# Patient Record
Sex: Male | Born: 1955 | Race: White | Hispanic: No | Marital: Married | Smoking: Never smoker
Health system: Southern US, Community
[De-identification: ages and names within clinical notes are randomized; demographics above are authoritative.]

## PROBLEM LIST (undated history)

## (undated) DIAGNOSIS — C801 Malignant (primary) neoplasm, unspecified: Secondary | ICD-10-CM

## (undated) DIAGNOSIS — I4891 Unspecified atrial fibrillation: Secondary | ICD-10-CM

## (undated) DIAGNOSIS — Z86718 Personal history of other venous thrombosis and embolism: Secondary | ICD-10-CM

## (undated) DIAGNOSIS — M1990 Unspecified osteoarthritis, unspecified site: Secondary | ICD-10-CM

## (undated) HISTORY — PX: BACK SURGERY: SHX140

---

## 1998-07-01 HISTORY — PX: BACK SURGERY: SHX140

## 2019-08-02 DIAGNOSIS — I499 Cardiac arrhythmia, unspecified: Secondary | ICD-10-CM

## 2019-08-02 HISTORY — DX: Cardiac arrhythmia, unspecified: I49.9

## 2019-10-08 ENCOUNTER — Emergency Department (HOSPITAL_BASED_OUTPATIENT_CLINIC_OR_DEPARTMENT_OTHER): Payer: Medicare Other

## 2019-10-08 ENCOUNTER — Other Ambulatory Visit: Payer: Self-pay

## 2019-10-08 ENCOUNTER — Encounter (HOSPITAL_BASED_OUTPATIENT_CLINIC_OR_DEPARTMENT_OTHER): Payer: Self-pay | Admitting: *Deleted

## 2019-10-08 ENCOUNTER — Emergency Department (HOSPITAL_BASED_OUTPATIENT_CLINIC_OR_DEPARTMENT_OTHER)
Admission: EM | Admit: 2019-10-08 | Discharge: 2019-10-08 | Disposition: A | Discharge status: Home / Self Care | Payer: Medicare Other | Attending: Emergency Medicine | Admitting: Emergency Medicine

## 2019-10-08 DIAGNOSIS — M79605 Pain in left leg: Secondary | ICD-10-CM

## 2019-10-08 DIAGNOSIS — M79652 Pain in left thigh: Secondary | ICD-10-CM | POA: Insufficient documentation

## 2019-10-08 DIAGNOSIS — Z8546 Personal history of malignant neoplasm of prostate: Secondary | ICD-10-CM | POA: Insufficient documentation

## 2019-10-08 MED ORDER — CELECOXIB 200 MG PO CAPS: 200.0000 mg | ORAL_CAPSULE | Freq: Two times a day (BID) | ORAL | 1 refills | Status: DC | PRN

## 2019-10-08 NOTE — Discharge Instructions (Addendum)
Please call the office of Dr. Raeford Razor here at [facility] to schedule appointment for physical therapy.  I have prescribed you to 200 mg Celebrex.  Please take, as prescribed.  Discontinue should you notice any dark black stools or should you develop epigastric pain.  Return to the ED or seek immediate medical attention should you experience any new or worsening symptoms.

## 2019-10-08 NOTE — ED Triage Notes (Signed)
Leg numbness and pain x years  Past few weeks left leg is worse unable to stand on it but for a few minutes  Hx of back surgery

## 2019-10-08 NOTE — ED Provider Notes (Signed)
[place] EMERGENCY DEPARTMENT Provider Note   CSN: [id number] Arrival date & time: 10/08/19  [id number]     History Chief Complaint  Patient presents with  . Leg Pain    Raymond Walsh is a 64 y.o. male with past medical history significant for osteoarthritis, cervical fusion, melanoma, and prostate cancer diagnosed in 2016 presents to the ED with a 3-week history of proximal left thigh discomfort with standing.  Patient is very active and exercises on a daily basis.  He has his own personal gym and is able to squat 225 lb without any significant limitation or discomfort.  However, he has noticed that with prolonged standing he will develop some proximal anterior thigh discomfort described as a sharp, burning sensation.  He has been taking Advil, with some relief.  He sees Dr. Grandville Silos, hand surgery, for his osteoarthritis.  Patient is being followed by Dr. Estill Dooms at [facility] and patient's PSA has been downward trending and he is considered to be low risk with ongoing surveillance every six months.   Patient reports that his wife sent him to the ER for evaluation because she was concerned about possible arterial or venous thrombosis.  He denies any recent illness, fevers or chills, weakness, true numbness, pain with defecation, incontinence, urinary symptoms, precipitating trauma, history of clots, clotting disorder, recent surgery or immobilization, blurred vision, gait disturbance, or other focal neurologic deficits.    HPI     History reviewed. No pertinent past medical history.  There are no problems to display for this patient.    No family history on file.  Social History   Tobacco Use  . Smoking status: Not on file  Substance Use Topics  . Alcohol use: Not on file  . Drug use: Not on file    Home Medications Prior to Admission medications   Medication Sig Start Date End Date Taking? Authorizing Provider  celecoxib (CELEBREX) 200 MG capsule Take 1 capsule (200  mg total) by mouth 2 (two) times daily between meals as needed for moderate pain. 10/08/19   Corena Herter, PA-C    Allergies    Patient has no known allergies.  Review of Systems   Review of Systems  Musculoskeletal: Positive for myalgias.  Skin: Negative for color change and wound.  Neurological: Negative for weakness and numbness.    Physical Exam Updated Vital Signs BP (!) 142/85 (BP Location: Right Arm)   Pulse 64   Temp 98.5 F (36.9 C) (Oral)   Resp 18   Ht 5\' 11"  (1.803 m)   Wt 108.9 kg   SpO2 99%   BMI 33.48 kg/m   Physical Exam Vitals and nursing note reviewed. Exam conducted with a chaperone present.  Constitutional:      General: He is not in acute distress.    Appearance: Normal appearance. He is not ill-appearing.  HENT:     Head: Normocephalic and atraumatic.  Eyes:     General: No scleral icterus.    Conjunctiva/sclera: Conjunctivae normal.  Cardiovascular:     Rate and Rhythm: Normal rate and regular rhythm.     Pulses: Normal pulses.     Heart sounds: Normal heart sounds.  Pulmonary:     Effort: Pulmonary effort is normal. No respiratory distress.     Breath sounds: Normal breath sounds.  Musculoskeletal:     Cervical back: Normal range of motion and neck supple. No rigidity.     Comments: No midline cervical, thoracic, lumbar, or sacral TTP.  Left leg: No swelling, rash, erythema, dusky appearance, or other overlying skin changes.  No warmth.  No tenderness to palpation.  Negative SLR bilaterally.  Pedal pulse and cap refill intact.  Sensation intact throughout.  Able to flex and extend at hip, knee, and ankle against resistance.  Skin:    General: Skin is dry.     Capillary Refill: Capillary refill takes less than 2 seconds.  Neurological:     General: No focal deficit present.     Mental Status: He is alert and oriented to person, place, and time.     GCS: GCS eye subscore is 4. GCS verbal subscore is 5. GCS motor subscore is 6.     Cranial  Nerves: No cranial nerve deficit.     Sensory: No sensory deficit.     Motor: No weakness.     Gait: Gait normal.     Comments: Sensation intact throughout.  No gait abnormality.  Psychiatric:        Mood and Affect: Mood normal.        Behavior: Behavior normal.        Thought Content: Thought content normal.     ED Results / Procedures / Treatments   Labs (all labs ordered are listed, but only abnormal results are displayed) Labs Reviewed - No data to display  EKG None  Radiology DG Lumbar Spine Complete  Result Date: 10/08/2019 CLINICAL DATA:  Left anterior thigh pain. History of prostate cancer. EXAM: LUMBAR SPINE - COMPLETE 4+ VIEW COMPARISON:  No prior. FINDINGS: Lumbar spine scoliosis concave left. Diffuse degenerative change. No acute bony abnormality identified. No evidence of fracture. Vascular calcifications noted. IMPRESSION: Lumbar spine scoliosis concave left. Diffuse degenerative change. No acute bony abnormality identified. Electronically Signed   By: Marcello Moores  Register   On: 10/08/2019 09:57   DG Pelvis 1-2 Views  Result Date: 10/08/2019 CLINICAL DATA:  Groin pain.  History of prostate cancer. EXAM: PELVIS - 1-2 VIEW COMPARISON:  No prior. FINDINGS: Degenerative changes lumbar spine and both hips. Degenerative changes both SI joints. No acute bony abnormality identified. No evidence of fracture dislocation. Pelvic calcifications consistent phleboliths. IMPRESSION: Degenerative changes lumbar spine and both hips. No acute bony abnormality identified. Electronically Signed   By: Marcello Moores  Register   On: 10/08/2019 09:56    Procedures Procedures (including critical care time)  Medications Ordered in ED Medications - No data to display  ED Course  I have reviewed the triage vital signs and the nursing notes.  Pertinent labs & imaging results that were available during my care of the patient were reviewed by me and considered in my medical decision making (see chart for  details).    MDM Rules/Calculators/A&P                      Patient's history and physical exam is not concerning for arterial or venous thrombosis.  No worsening leg pain with exertion or strength training.  Patient is able to ambulate and perform squats without difficulty or limitation.  No concern for femoral fracture.  Compartments are soft.  Sensation is intact throughout and symmetric when compared to right side.  There is no swelling or overlying skin changes that would be otherwise concerning for an acute process.  His symptoms have been going on for several weeks and have been alleviated with Advil.  Suspect musculoskeletal etiology, low suspicion for vascular.  I personally reviewed plain films obtained of lumbar spine and hip which  were ordered to evaluate for possible malignancy or other acute osseous abnormalities.  Patient demonstrate diffuse degenerative changes, but nothing acute that might explain his 3-week history of discomfort.   Patient is seeing his primary care physician, Dr. Claiborne Billings, for 10/11/2019.  We will refer patient to physical therapy for ongoing evaluation management.  Will have patient call the [facility] to schedule an appointment for physical therapy.  Dr. Gilford Raid personally evaluated patient and agrees with assessment and plan.   Strict ED return precautions discussed.  All of the evaluation and work-up results were discussed with the patient and any family at bedside. They were provided opportunity to ask any additional questions and have none at this time. They have expressed understanding of verbal discharge instructions as well as return precautions and are agreeable to the plan.    Final Clinical Impression(s) / ED Diagnoses Final diagnoses:  Left leg pain    Rx / DC Orders ED Discharge Orders         Ordered    celecoxib (CELEBREX) 200 MG capsule  2 times daily between meals PRN     10/08/19 0959           Corena Herter,  PA-C 10/08/19 1018    Isla Pence, MD 10/08/19 1425

## 2019-10-20 ENCOUNTER — Ambulatory Visit: Payer: Self-pay

## 2019-10-20 ENCOUNTER — Other Ambulatory Visit: Payer: Self-pay

## 2019-10-20 ENCOUNTER — Encounter: Payer: Self-pay | Admitting: Family Medicine

## 2019-10-20 ENCOUNTER — Ambulatory Visit (INDEPENDENT_AMBULATORY_CARE_PROVIDER_SITE_OTHER): Payer: Medicare Other | Admitting: Family Medicine

## 2019-10-20 VITALS — BP 119/81 | HR 58 | Ht 72.0 in | Wt 230.0 lb

## 2019-10-20 DIAGNOSIS — M25552 Pain in left hip: Secondary | ICD-10-CM

## 2019-10-20 MED ORDER — METHYLPREDNISOLONE ACETATE 40 MG/ML IJ SUSP
40.0000 mg | Freq: Once | INTRAMUSCULAR | Status: AC
  Administered 2019-10-20: 40 mg via INTRA_ARTICULAR

## 2019-10-20 NOTE — Patient Instructions (Addendum)
Nice to meet you Please try the exercises. (Side lunges)  Please try ice or heat   You can try rubbing voltaren over the counter on the area.  Please send me a message in MyChart with any questions or updates.  Please see me back in 4 weeks.   --Dr. Raeford Razor

## 2019-10-20 NOTE — Progress Notes (Signed)
Raymond Walsh - 64 y.o. male MRN [id number]  Date of birth: 1956-04-14  SUBJECTIVE:  Including CC & ROS.  Chief Complaint  Patient presents with  . Leg Pain    left leg x 4-5 weeks    Raymond Walsh is a 64 y.o. male that is presenting with left lateral hip pain.  The pain is been ongoing for roughly a month.  Pain is worse with walking and has to sit on a regular basis.  Denies any history of trauma or injury.  Has not started anything new or different.  No improvement with diclofenac..  Independent review of the lumbar spine x-ray from 4/9 shows loss of the lordosis and multilevel degenerative changes of the disc at L4-L5, L5-S1. Independent review of the pelvis x-ray from 4/9 shows no significant joint disease of the hips.   Review of Systems See HPI   HISTORY: Past Medical, Surgical, Social, and Family History Reviewed & Updated per EMR.   Pertinent Historical Findings include:  No past medical history on file.  Past Surgical History:  Procedure Laterality Date  . BACK SURGERY      No family history on file.  Social History   Socioeconomic History  . Marital status: Married    Spouse name: Not on file  . Number of children: Not on file  . Years of education: Not on file  . Highest education level: Not on file  Occupational History  . Not on file  Tobacco Use  . Smoking status: Never Smoker  . Smokeless tobacco: Never Used  Substance and Sexual Activity  . Alcohol use: Not on file  . Drug use: Not on file  . Sexual activity: Not on file  Other Topics Concern  . Not on file  Social History Narrative  . Not on file   Social Determinants of Health   Financial Resource Strain:   . Difficulty of Paying Living Expenses:   Food Insecurity:   . Worried About Charity fundraiser in the Last Year:   . Arboriculturist in the Last Year:   Transportation Needs:   . Film/video editor (Medical):   Marland Kitchen Lack of Transportation (Non-Medical):   Physical Activity:     . Days of Exercise per Week:   . Minutes of Exercise per Session:   Stress:   . Feeling of Stress :   Social Connections:   . Frequency of Communication with Friends and Family:   . Frequency of Social Gatherings with Friends and Family:   . Attends Religious Services:   . Active Member of Clubs or Organizations:   . Attends Archivist Meetings:   Marland Kitchen Marital Status:   Intimate Partner Violence:   . Fear of Current or Ex-Partner:   . Emotionally Abused:   Marland Kitchen Physically Abused:   . Sexually Abused:      PHYSICAL EXAM:  VS: BP 119/81   Pulse (!) 58   Ht 6' (1.829 m)   Wt 230 lb (104.3 kg)   BMI 31.19 kg/m  Physical Exam Gen: NAD, alert, cooperative with exam, well-appearing MSK:  Left hip: Normal internal and external rotation of the hips. Tenderness palpation over the greater trochanter. Normal strength resistance with hip flexion. Normal flexion and extension of the back. Good stability 1 leg standing. Instability with 1 leg standing and abduction. Negative straight leg raise. Neurovascular intact   Aspiration/Injection Procedure Note Herb Euresti 20-Nov-1955  Procedure: Injection Indications: Left hip pain  Procedure Details  Consent: Risks of procedure as well as the alternatives and risks of each were explained to the (patient/caregiver).  Consent for procedure obtained. Time Out: Verified patient identification, verified procedure, site/side was marked, verified correct patient position, special equipment/implants available, medications/allergies/relevent history reviewed, required imaging and test results available.  Performed.  The area was cleaned with iodine and alcohol swabs.    The left greater trochanteric bursa was injected using 1 cc's of 40 mg of Depo-Medrol and 4 cc's of 0.25% bupivacaine with a 22 3 1/2" needle.  Ultrasound was used. Images were obtained in short views showing the injection.     A sterile dressing was applied.  Patient  did tolerate procedure well.     ASSESSMENT & PLAN:   Greater trochanteric pain syndrome of left lower extremity Pain likely representative trochanteric pain syndrome.  He has weakness on hip abduction.  Unlikely to be joint involvement. -Injection today. -Counseled on home exercise therapy and supportive care. -Could consider physical therapy

## 2019-10-20 NOTE — Assessment & Plan Note (Signed)
Pain likely representative trochanteric pain syndrome.  He has weakness on hip abduction.  Unlikely to be joint involvement. -Injection today. -Counseled on home exercise therapy and supportive care. -Could consider physical therapy

## 2019-11-17 ENCOUNTER — Encounter: Payer: Self-pay | Admitting: Family Medicine

## 2019-11-17 ENCOUNTER — Ambulatory Visit (INDEPENDENT_AMBULATORY_CARE_PROVIDER_SITE_OTHER): Payer: Medicare Other | Admitting: Family Medicine

## 2019-11-17 ENCOUNTER — Other Ambulatory Visit: Payer: Self-pay

## 2019-11-17 DIAGNOSIS — M25552 Pain in left hip: Secondary | ICD-10-CM | POA: Diagnosis present

## 2019-11-17 NOTE — Assessment & Plan Note (Signed)
Doing well since his injection.  Pain is intermittent at this point. -Counseled on home exercise therapy and supportive care. -Could try physical therapy. -Could repeat injection if needed.

## 2019-11-17 NOTE — Progress Notes (Signed)
  Raymond Walsh - 64 y.o. male MRN [id number]  Date of birth: 1955-10-10  SUBJECTIVE:  Including CC & ROS.  Chief Complaint  Patient presents with  . Follow-up    left leg    Raymond Walsh is a 64 y.o. male that is following up for his left leg pain.  The injection seemed to help with most of the pain.  It will occur from time to time.  He does exercises when that happens and his pain reduces.  He does not have to stop and sit down while walking.   Review of Systems See HPI   HISTORY: Past Medical, Surgical, Social, and Family History Reviewed & Updated per EMR.   Pertinent Historical Findings include:  No past medical history on file.  Past Surgical History:  Procedure Laterality Date  . BACK SURGERY      No family history on file.  Social History   Socioeconomic History  . Marital status: Married    Spouse name: Not on file  . Number of children: Not on file  . Years of education: Not on file  . Highest education level: Not on file  Occupational History  . Not on file  Tobacco Use  . Smoking status: Never Smoker  . Smokeless tobacco: Never Used  Substance and Sexual Activity  . Alcohol use: Not on file  . Drug use: Not on file  . Sexual activity: Not on file  Other Topics Concern  . Not on file  Social History Narrative  . Not on file   Social Determinants of Health   Financial Resource Strain:   . Difficulty of Paying Living Expenses:   Food Insecurity:   . Worried About Charity fundraiser in the Last Year:   . Arboriculturist in the Last Year:   Transportation Needs:   . Film/video editor (Medical):   Marland Kitchen Lack of Transportation (Non-Medical):   Physical Activity:   . Days of Exercise per Week:   . Minutes of Exercise per Session:   Stress:   . Feeling of Stress :   Social Connections:   . Frequency of Communication with Friends and Family:   . Frequency of Social Gatherings with Friends and Family:   . Attends Religious Services:   . Active  Member of Clubs or Organizations:   . Attends Archivist Meetings:   Marland Kitchen Marital Status:   Intimate Partner Violence:   . Fear of Current or Ex-Partner:   . Emotionally Abused:   Marland Kitchen Physically Abused:   . Sexually Abused:      PHYSICAL EXAM:  VS: BP (!) 146/86   Pulse 76   Ht 6' (1.829 m)   Wt 225 lb (102.1 kg)   BMI 30.52 kg/m  Physical Exam Gen: NAD, alert, cooperative with exam, well-appearing MSK:  Left hip: No significant tenderness to palpation of the greater trochanter. Normal strength resistance with hip flexion. Normal internal and external rotation. Neurovascularly intact     ASSESSMENT & PLAN:   Greater trochanteric pain syndrome of left lower extremity Doing well since his injection.  Pain is intermittent at this point. -Counseled on home exercise therapy and supportive care. -Could try physical therapy. -Could repeat injection if needed.

## 2019-12-29 ENCOUNTER — Ambulatory Visit: Payer: Medicare Other | Admitting: Family Medicine

## 2020-06-15 ENCOUNTER — Encounter (HOSPITAL_BASED_OUTPATIENT_CLINIC_OR_DEPARTMENT_OTHER): Payer: Self-pay | Admitting: Emergency Medicine

## 2020-06-15 ENCOUNTER — Emergency Department (HOSPITAL_BASED_OUTPATIENT_CLINIC_OR_DEPARTMENT_OTHER)
Admission: EM | Admit: 2020-06-15 | Discharge: 2020-06-15 | Disposition: A | Discharge status: Home / Self Care | Payer: Medicare Other | Attending: Emergency Medicine | Admitting: Emergency Medicine

## 2020-06-15 ENCOUNTER — Other Ambulatory Visit: Payer: Self-pay

## 2020-06-15 ENCOUNTER — Emergency Department (HOSPITAL_BASED_OUTPATIENT_CLINIC_OR_DEPARTMENT_OTHER): Payer: Medicare Other

## 2020-06-15 DIAGNOSIS — I4891 Unspecified atrial fibrillation: Secondary | ICD-10-CM | POA: Insufficient documentation

## 2020-06-15 DIAGNOSIS — F17210 Nicotine dependence, cigarettes, uncomplicated: Secondary | ICD-10-CM | POA: Insufficient documentation

## 2020-06-15 DIAGNOSIS — R55 Syncope and collapse: Secondary | ICD-10-CM | POA: Insufficient documentation

## 2020-06-15 DIAGNOSIS — Z8546 Personal history of malignant neoplasm of prostate: Secondary | ICD-10-CM | POA: Insufficient documentation

## 2020-06-15 DIAGNOSIS — I480 Paroxysmal atrial fibrillation: Secondary | ICD-10-CM

## 2020-06-15 HISTORY — DX: Unspecified atrial fibrillation: I48.91

## 2020-06-15 HISTORY — DX: Malignant (primary) neoplasm, unspecified: C80.1

## 2020-06-15 LAB — CBC
HCT: 43.7 % (ref 39.0–52.0)
Hemoglobin: 15.2 g/dL (ref 13.0–17.0)
MCH: 31.3 pg (ref 26.0–34.0)
MCHC: 34.8 g/dL (ref 30.0–36.0)
MCV: 89.9 fL (ref 80.0–100.0)
Platelets: 339 10*3/uL (ref 150–400)
RBC: 4.86 MIL/uL (ref 4.22–5.81)
RDW: 12.1 % (ref 11.5–15.5)
WBC: 8.8 10*3/uL (ref 4.0–10.5)
nRBC: 0 % (ref 0.0–0.2)

## 2020-06-15 LAB — BASIC METABOLIC PANEL
Anion gap: 9 (ref 5–15)
BUN: 22 mg/dL (ref 8–23)
CO2: 23 mmol/L (ref 22–32)
Calcium: 9.1 mg/dL (ref 8.9–10.3)
Chloride: 103 mmol/L (ref 98–111)
Creatinine, Ser: 1.36 mg/dL — ABNORMAL HIGH (ref 0.61–1.24)
GFR, Estimated: 57 mL/min — ABNORMAL LOW (ref >60)
Glucose, Bld: 119 mg/dL — ABNORMAL HIGH (ref 70–99)
Potassium: 3.9 mmol/L (ref 3.5–5.1)
Sodium: 135 mmol/L (ref 135–145)

## 2020-06-15 LAB — CBG MONITORING, ED: Glucose-Capillary: 116 mg/dL — ABNORMAL HIGH (ref 70–99)

## 2020-06-15 NOTE — ED Triage Notes (Signed)
Syncope approx 45 minutes, family reports big coughing fit, turned red, and hit the floor by fireplace. Pt reports feeling tired now, denies pain. Seen today at pcp, was found to be in afib and restarted on eliquis.

## 2020-06-15 NOTE — ED Provider Notes (Signed)
[place] EMERGENCY DEPARTMENT Provider Note   CSN: [id number] Arrival date & time: 06/15/20  2005     History Chief Complaint  Patient presents with   Loss of Consciousness    Raymond Walsh is a 64 y.o. male.  Presents here with concern for syncope.  Episode happened this evening.  Says anything he got something caught in his throat while he was eating and he had a coughing fit.  Wife states that his face turned red and then he passed out.  Episode was brief, complete return of consciousness, denied head trauma, hit his bottom.  Ambulatory without difficulty since.  No symptoms at present.  Denies any associated chest pain or shortness of breath.  History of paroxysmal A. fib, was recently noted to be in A. fib and primary doctor's office and patient reports his primary doctor is going to set him up with a cardiologist to discuss further management, consideration of cardioversion.  Patient has no palpitations, chest discomfort or lightheadedness associated with A. fib episodes.  Unaware that he was in A. fib until someone told him.  HPI     Past Medical History:  Diagnosis Date   A-fib ([place])    Cancer [facility])    prostate    Patient Active Problem List   Diagnosis Date Noted   Greater trochanteric pain syndrome of left lower extremity 10/20/2019    Past Surgical History:  Procedure Laterality Date   BACK SURGERY         No family history on file.  Social History   Tobacco Use   Smoking status: Current Some Day Smoker   Smokeless tobacco: Never Used  Substance Use Topics   Drug use: Yes    Types: Marijuana    Home Medications Prior to Admission medications   Medication Sig Start Date End Date Taking? Authorizing Provider  celecoxib (CELEBREX) 200 MG capsule Take 1 capsule (200 mg total) by mouth 2 (two) times daily between meals as needed for moderate pain. 10/08/19   Corena Herter, PA-C    Allergies    Patient has no known  allergies.  Review of Systems   Review of Systems  Constitutional: Negative for chills and fever.  HENT: Negative for ear pain and sore throat.   Eyes: Negative for pain and visual disturbance.  Respiratory: Positive for cough. Negative for shortness of breath.   Cardiovascular: Negative for chest pain and palpitations.  Gastrointestinal: Negative for abdominal pain and vomiting.  Genitourinary: Negative for dysuria and hematuria.  Musculoskeletal: Negative for arthralgias and back pain.  Skin: Negative for color change and rash.  Neurological: Positive for syncope. Negative for seizures.  All other systems reviewed and are negative.   Physical Exam Updated Vital Signs BP 111/85 (BP Location: Right Arm)    Pulse 85    Temp 98.3 F (36.8 C) (Oral)    Resp (!) 29    Ht 5\' 11"  (1.803 m)    Wt 108.9 kg    SpO2 96%    BMI 33.47 kg/m   Physical Exam Vitals and nursing note reviewed.  Constitutional:      Appearance: He is well-developed and well-nourished.  HENT:     Head: Normocephalic and atraumatic.  Eyes:     Conjunctiva/sclera: Conjunctivae normal.  Cardiovascular:     Rate and Rhythm: Normal rate. Rhythm irregular.     Pulses: Normal pulses.     Heart sounds: No murmur heard.   Pulmonary:     Effort:  Pulmonary effort is normal. No respiratory distress.     Breath sounds: Normal breath sounds.  Abdominal:     Palpations: Abdomen is soft.     Tenderness: There is no abdominal tenderness.  Musculoskeletal:        General: No deformity, signs of injury or edema.     Cervical back: Neck supple.  Skin:    General: Skin is warm and dry.  Neurological:     General: No focal deficit present.     Mental Status: He is alert.  Psychiatric:        Mood and Affect: Mood and affect and mood normal.        Behavior: Behavior normal.     ED Results / Procedures / Treatments   Labs (all labs ordered are listed, but only abnormal results are displayed) Labs Reviewed  BASIC  METABOLIC PANEL - Abnormal; Notable for the following components:      Result Value   Glucose, Bld 119 (*)    Creatinine, Ser 1.36 (*)    GFR, Estimated 57 (*)    All other components within normal limits  CBG MONITORING, ED - Abnormal; Notable for the following components:   Glucose-Capillary 116 (*)    All other components within normal limits  CBC  URINALYSIS, ROUTINE W REFLEX MICROSCOPIC    EKG EKG Interpretation  Date/Time:  Thursday June 15 2020 20:12:10 EST Ventricular Rate:  73 PR Interval:    QRS Duration: 128 QT Interval:  360 QTC Calculation: 396 R Axis:   -47 Text Interpretation: Atrial fibrillation Right bundle branch block Left anterior fascicular block  Bifascicular block  Minimal voltage criteria for LVH, may be normal variant ( R in aVL ) T wave abnormality, consider lateral ischemia Abnormal ECG no prior for comparison no acute STEMI Confirmed by Madalyn Rob [phone]) on 06/15/2020 8:19:58 PM   Radiology No results found.  Procedures Procedures (including critical care time)  Medications Ordered in ED Medications - No data to display  ED Course  I have reviewed the triage vital signs and the nursing notes.  Pertinent labs & imaging results that were available during my care of the patient were reviewed by me and considered in my medical decision making (see chart for details).    MDM Rules/Calculators/A&P                          64 year old male presenting to the emergency room with concern for syncopal episode.  On exam patient noted to be well-appearing with stable vital signs.  No ongoing symptoms.  Basic labs within normal limits.  CXR negative.  Based on review of cardiac monitor and EKG, patient is currently in atrial fibrillation.  Known history of A. fib, though patient normally in sinus.  Currently rate is within normal limits, patient has no symptoms and patient is already anticoagulated.  Given these findings, will defer further  management to outpatient cardiology.  His primary doctor is aware and is working on cardiology referral for further management.  Based on description of symptoms, suspect most likely vasovagal episode.  At present believe patient is appropriate for outpatient management.  Instructed patient needs close follow-up with primary doctor and cardiology.   After the discussed management above, the patient was determined to be safe for discharge.  The patient was in agreement with this plan and all questions regarding their care were answered.  ED return precautions were discussed and the patient will  return to the ED with any significant worsening of condition.   Final Clinical Impression(s) / ED Diagnoses Final diagnoses:  None    Rx / DC Orders ED Discharge Orders    None       Lucrezia Starch, MD 06/17/20 1110

## 2020-06-15 NOTE — ED Notes (Signed)
Resting quietly, awaiting EDP evaluation, remains on cont cardiac monitoring, wife at side

## 2020-06-15 NOTE — ED Notes (Signed)
States rec both Covid Vaccines, 2nd was October 2021

## 2020-06-15 NOTE — ED Notes (Signed)
Pt ambulated to restroom, pt was instructed to stay on stretcher previously, reinforced instructions to remain on stretcher and not to remove cardiac monitoring devices. Also instructed to use urinal to so that urine spec can be obtained

## 2020-06-15 NOTE — Discharge Instructions (Addendum)
Follow-up with your primary doctor and cardiology.  If you have any additional episodes of passing out, any chest pain or difficulty in breathing, return to ER.  Continue your previously prescribed medication.

## 2020-06-15 NOTE — ED Notes (Signed)
To primary MD today for preop for TKA, was told he had A fib. Was at home, eating dinner, had a deep, strong cough, had a syncopal episode, wife states he was coughing and face was very red. Wife states he was awake in a few seconds. Denies any pain at this time.

## 2021-01-31 ENCOUNTER — Other Ambulatory Visit: Payer: Self-pay

## 2021-01-31 ENCOUNTER — Encounter: Payer: Self-pay | Admitting: Physical Therapy

## 2021-01-31 ENCOUNTER — Ambulatory Visit (INDEPENDENT_AMBULATORY_CARE_PROVIDER_SITE_OTHER): Payer: Medicare Other | Admitting: Physical Therapy

## 2021-01-31 DIAGNOSIS — M6281 Muscle weakness (generalized): Secondary | ICD-10-CM | POA: Diagnosis present

## 2021-01-31 DIAGNOSIS — M25561 Pain in right knee: Secondary | ICD-10-CM

## 2021-01-31 DIAGNOSIS — R600 Localized edema: Secondary | ICD-10-CM | POA: Diagnosis not present

## 2021-01-31 DIAGNOSIS — R29898 Other symptoms and signs involving the musculoskeletal system: Secondary | ICD-10-CM

## 2021-01-31 NOTE — Therapy (Signed)
[place], [place], [address] Phone: [phone]   Fax:  [phone]  Physical Therapy Evaluation  Patient Details  Name: Raymond Walsh MRN: [id number] Date of Birth: May 01, 1956 Referring Provider (PT): Dorna Leitz   Encounter Date: 01/31/2021   PT End of Session - 01/31/21 1222     Visit Number 1    Number of Visits 12    Date for PT Re-Evaluation 03/14/21    Progress Note Due on Visit 10    PT Start Time 1140    PT Stop Time 1221    PT Time Calculation (min) 41 min    Activity Tolerance Patient tolerated treatment well;Patient limited by pain    Behavior During Therapy [facility] for tasks assessed/performed             Past Medical History:  Diagnosis Date   A-fib ([place])    Cancer [facility])    prostate    Past Surgical History:  Procedure Laterality Date   BACK SURGERY      There were no vitals filed for this visit.    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 Pt has been performing HHPT exercises daily and using CPM. Pt has been ambulating with SPC since surgery.    Limitations Standing;Walking    How long can you stand comfortably? 5 mins    How long can you walk comfortably? 5 mins    Patient Stated Goals decrease pain, return working out    Currently in Pain? Yes    Pain Score 5     Pain Location Knee    Pain Orientation Right    Pain Descriptors / Indicators Aching;Sore    Pain Type Surgical pain    Pain Onset 1 to 4 weeks ago    Pain Frequency Constant    Aggravating Factors  standing, walking    Pain Relieving Factors ice                [facility] PT Assessment - 01/31/21 0001       Assessment   Medical Diagnosis s/p Rt TKR    Referring Provider (PT) Dorna Leitz    Onset Date/Surgical Date 01/17/21    Next MD Visit 02/07/21      Restrictions   Other Position/Activity Restrictions WBAT      Balance Screen    Has the patient fallen in the past 6 months No      [place] residence    Home Access Stairs to enter    Entrance Stairs-Number of Steps 2    Entrance Stairs-Rails Can reach both    [place] One level      Prior Function   Level of Independence Independent      Observation/Other Assessments   Focus on Therapeutic Outcomes (FOTO)  41 functional status measure      ROM / Strength   AROM / PROM / Strength AROM;Strength      AROM   AROM Assessment Site Knee    Right/Left Knee Right;Left    Right Knee Extension -10    Right Knee Flexion 101    Left Knee Extension 0    Left Knee Flexion 134      Strength   Overall Strength Comments Rt hip grossly 4/5    Strength Assessment Site Knee    Right/Left Knee Right;Left    Right Knee  Flexion 3/5    Right Knee Extension 3-/5    Left Knee Flexion 4+/5    Left Knee Extension 4+/5      Flexibility   Soft Tissue Assessment /Muscle Length yes    Hamstrings decreased bilat Rt > Lt      Palpation   Patella mobility WFL    Palpation comment TTP Rt knee medial and lateral      Ambulation/Gait   Assistive device Straight cane    Gait Pattern Antalgic    Gait Comments decreased gait speed                        Objective measurements completed on examination: See above findings.       [place] Adult PT Treatment/Exercise - 01/31/21 0001       Exercises   Exercises Knee/Hip      Knee/Hip Exercises: Stretches   Gastroc Stretch 2 reps;20 seconds      Knee/Hip Exercises: Seated   Long Arc Quad 10 reps    Heel Slides 10 reps    Ball Squeeze 10 reps      Knee/Hip Exercises: Supine   Quad Sets 10 reps    Heel Slides 10 reps    Hip Adduction Isometric 10 reps    Straight Leg Raises 10 reps      Modalities   Modalities Vasopneumatic      Vasopneumatic   Number Minutes Vasopneumatic  10 minutes    Vasopnuematic Location  Knee    Vasopneumatic Pressure Low     Vasopneumatic Temperature  34                    PT Education - 01/31/21 1203     Education Details PT POC and goals, HEP    Person(s) Educated Patient    Methods Explanation;Demonstration;Handout    Comprehension Returned demonstration;Verbalized understanding                 PT Long Term Goals - 01/31/21 1228       PT LONG TERM GOAL #1   Title Pt will be independent in HEP    Time 6    Period Weeks    Status New    Target Date 03/14/21      PT LONG TERM GOAL #2   Title Pt will improve FOTO to 74 to demo improved functional mobility    Time 6    Period Weeks    Status New    Target Date 03/14/21      PT LONG TERM GOAL #3   Title Pt will improve Rt knee extension AROM to 0 degrees to perform gait without pain    Time 6    Period Weeks    Status New    Target Date 03/14/21      PT LONG TERM GOAL #4   Title Pt will improve Rt LE strength to 4+/5 to perform gait and standing with decreased pain    Time 6    Period Weeks    Status New    Target Date 03/14/21      PT LONG TERM GOAL #5   Title Pt will perform gait without AD in community with pain <= 2/10    Time 6    Period Weeks    Status New    Target Date 03/14/21  Plan - 01/31/21 1223     Clinical Impression Statement Pt is a 65 y/o male who is referred s/p Rt TKR 01/17/21. Pt presents with increased pain, difficulty walking, decreased ROM and strength, increased edema and decreased activity tolerance. Pt will benefit from skilled PT to address deficits and improve functional independence and mobility    Personal Factors and Comorbidities Time since onset of injury/illness/exacerbation    Examination-Activity Limitations Squat;Stairs;Locomotion Level;Stand    Examination-Participation Restrictions Driving;Community Activity;Cleaning;Yard Work    Merchant navy officer Stable/Uncomplicated    Clinical Decision Making Low    Rehab Potential Good    PT  Frequency 2x / week    PT Duration 6 weeks    PT Treatment/Interventions Vasopneumatic Device;Dry needling;Passive range of motion;Taping;Manual techniques;Patient/family education;Therapeutic activities;Therapeutic exercise;Neuromuscular re-education;Balance training;Gait training;Stair training;Moist Heat;Cryotherapy;Aquatic Therapy;Iontophoresis '4mg'$ /ml Dexamethasone;Electrical Stimulation    PT Next Visit Plan assess HEP, progress to standing exercises if able to tolerate, gait and stair training    PT Home Exercise Plan [email or web address]    Consulted and Agree with Plan of Care Patient             Patient will benefit from skilled therapeutic intervention in order to improve the following deficits and impairments:  Pain, Decreased activity tolerance, Decreased strength, Increased edema, Decreased range of motion, Abnormal gait, Difficulty walking, Decreased mobility  Visit Diagnosis: Muscle weakness (generalized) - Plan: PT plan of care cert/re-cert  Acute pain of right knee - Plan: PT plan of care cert/re-cert  Other symptoms and signs involving the musculoskeletal system - Plan: PT plan of care cert/re-cert  Localized edema - Plan: PT plan of care cert/re-cert     Problem List Patient Active Problem List   Diagnosis Date Noted   Greater trochanteric pain syndrome of left lower extremity 10/20/2019  Rechy Bost, PT   Davaughn Hillyard 01/31/2021, 12:33 PM  [place], [place], [address] Phone: [phone]   Fax:  [phone]  Name: Dywan Burki MRN: [id number] Date of Birth: 04/03/1956

## 2021-01-31 NOTE — Patient Instructions (Signed)
Access Code: [email or web address] URL: [email or web address] Date: 01/31/2021 Prepared by: Isabelle Course  Exercises Supine Quad Set - 1 x daily - 7 x weekly - 3 sets - 10 reps Active Straight Leg Raise with Quad Set - 1 x daily - 7 x weekly - 3 sets - 10 reps Supine Heel Slide with Strap - 1 x daily - 7 x weekly - 3 sets - 10 reps Long Sitting Calf Stretch with Strap - 1 x daily - 7 x weekly - 3 sets - 1 reps - 20-30 seconds hold Supine Hip Abduction - 1 x daily - 7 x weekly - 3 sets - 10 reps Seated Long Arc Quad - 1 x daily - 7 x weekly - 3 sets - 10 reps Seated Hip Adduction Isometrics with Ball - 1 x daily - 7 x weekly - 3 sets - 10 reps Seated Knee Flexion Extension AROM - 1 x daily - 7 x weekly - 3 sets - 10 reps

## 2021-02-06 ENCOUNTER — Other Ambulatory Visit: Payer: Self-pay

## 2021-02-06 ENCOUNTER — Ambulatory Visit (INDEPENDENT_AMBULATORY_CARE_PROVIDER_SITE_OTHER): Payer: Medicare Other | Admitting: Physical Therapy

## 2021-02-06 DIAGNOSIS — M6281 Muscle weakness (generalized): Secondary | ICD-10-CM | POA: Diagnosis present

## 2021-02-06 DIAGNOSIS — R29898 Other symptoms and signs involving the musculoskeletal system: Secondary | ICD-10-CM | POA: Diagnosis not present

## 2021-02-06 DIAGNOSIS — M25561 Pain in right knee: Secondary | ICD-10-CM

## 2021-02-06 DIAGNOSIS — R600 Localized edema: Secondary | ICD-10-CM

## 2021-02-06 NOTE — Patient Instructions (Signed)
Access Code: [email or web address] URL: [email or web address] Date: 02/06/2021 Prepared by: Isabelle Course  Exercises Supine Quad Set - 1 x daily - 7 x weekly - 3 sets - 10 reps Active Straight Leg Raise with Quad Set - 1 x daily - 7 x weekly - 3 sets - 10 reps Long Sitting Calf Stretch with Strap - 1 x daily - 7 x weekly - 3 sets - 1 reps - 20-30 seconds hold Seated Long Arc Quad - 1 x daily - 7 x weekly - 3 sets - 10 reps Seated Knee Flexion Extension AROM - 1 x daily - 7 x weekly - 3 sets - 10 reps Standing Terminal Knee Extension with Resistance - 1 x daily - 7 x weekly - 3 sets - 10 reps Standing Single Leg Stance with Counter Support - 1 x daily - 7 x weekly - 3 sets - 1 reps - up to 20 seconds hold Step Up - 1 x daily - 7 x weekly - 3 sets - 10 reps Lateral Step Ups - 1 x daily - 7 x weekly - 3 sets - 10 reps

## 2021-02-06 NOTE — Therapy (Signed)
[place], [place], [address] Phone: [phone]   Fax:  [phone]  Physical Therapy Treatment  Patient Details  Name: Jaysin Lauridsen MRN: [id number] Date of Birth: 1955-12-13 Referring Provider (PT): Dorna Leitz   Encounter Date: 02/06/2021   PT End of Session - 02/06/21 1057     Visit Number 2    Number of Visits 12    Date for PT Re-Evaluation 03/14/21    Authorization - Visit Number 2    Progress Note Due on Visit 10    PT Start Time 1016    PT Stop Time 1103    PT Time Calculation (min) 47 min    Activity Tolerance Patient tolerated treatment well    Behavior During Therapy [facility] for tasks assessed/performed             Past Medical History:  Diagnosis Date   A-fib ([place])    Cancer ([place])    prostate    Past Surgical History:  Procedure Laterality Date   BACK SURGERY      There were no vitals filed for this visit.   Subjective Assessment - 02/06/21 1022     Subjective Pt states "it is some better but it still hurts all the time. It never quits". Pt states he wants his strength back    Patient Stated Goals decrease pain, return working out    Currently in Pain? Yes    Pain Score 3     Pain Location Knee    Pain Orientation Right    Pain Descriptors / Indicators Aching    Pain Type Surgical pain                [facility] PT Assessment - 02/06/21 0001       AROM   Right Knee Extension -8                           [facility] Adult PT Treatment/Exercise - 02/06/21 0001       Knee/Hip Exercises: Aerobic   Nustep L5 x 5 min for warm up      Knee/Hip Exercises: Machines for Strengthening   Total Gym Leg Press 2 x 10 7 plates    Other Machine heel raises on leg press machine 2 x 10 4 plates      Knee/Hip Exercises: Standing   Heel Raises 20 reps    Terminal Knee Extension 10 reps    Theraband Level (Terminal Knee Extension) Level 2 (Red)    Lateral  Step Up Right;Hand Hold: 0;Step Height: 4";15 reps    Forward Step Up Right;Hand Hold: 0;10 reps;Step Height: 6"      Knee/Hip Exercises: Supine   Quad Sets 10 reps    Heel Slides 10 reps    Straight Leg Raises 10 reps      Vasopneumatic   Number Minutes Vasopneumatic  10 minutes    Vasopnuematic Location  Knee    Vasopneumatic Pressure Low    Vasopneumatic Temperature  34      Manual Therapy   Manual Therapy Joint mobilization    Joint Mobilization Rt knee to improve extension                    PT Education - 02/06/21 1049     Education Details updated HEP    Person(s) Educated Patient    Methods Explanation;Demonstration;Handout    Comprehension Returned demonstration;Verbalized understanding  PT Long Term Goals - 01/31/21 1228       PT LONG TERM GOAL #1   Title Pt will be independent in HEP    Time 6    Period Weeks    Status New    Target Date 03/14/21      PT LONG TERM GOAL #2   Title Pt will improve FOTO to 74 to demo improved functional mobility    Time 6    Period Weeks    Status New    Target Date 03/14/21      PT LONG TERM GOAL #3   Title Pt will improve Rt knee extension AROM to 0 degrees to perform gait without pain    Time 6    Period Weeks    Status New    Target Date 03/14/21      PT LONG TERM GOAL #4   Title Pt will improve Rt LE strength to 4+/5 to perform gait and standing with decreased pain    Time 6    Period Weeks    Status New    Target Date 03/14/21      PT LONG TERM GOAL #5   Title Pt will perform gait without AD in community with pain <= 2/10    Time 6    Period Weeks    Status New    Target Date 03/14/21                   Plan - 02/06/21 1058     Clinical Impression Statement Pt with improved tolerance to standing exercises today. HEP updated and pt demo'd leg press and calf raise on leg press machine. PT encouraged pt to ease slowly back into strength training lower body at  home    PT Next Visit Plan continue to progress strengthening and ROM. stool roll    PT Home Exercise Plan [id number]    Consulted and Agree with Plan of Care Patient             Patient will benefit from skilled therapeutic intervention in order to improve the following deficits and impairments:     Visit Diagnosis: Muscle weakness (generalized)  Acute pain of right knee  Other symptoms and signs involving the musculoskeletal system  Localized edema     Problem List Patient Active Problem List   Diagnosis Date Noted   Greater trochanteric pain syndrome of left lower extremity 10/20/2019   Godfrey Tritschler, PT  Shahida Schnackenberg 02/06/2021, 10:59 AM  [facility] [place], [place], [address] Phone: [phone]   Fax:  [phone]  Name: Arol Cottman MRN: [id number] Date of Birth: 03/29/1956

## 2021-02-08 ENCOUNTER — Other Ambulatory Visit: Payer: Self-pay

## 2021-02-08 ENCOUNTER — Ambulatory Visit (INDEPENDENT_AMBULATORY_CARE_PROVIDER_SITE_OTHER): Payer: Medicare Other | Admitting: Physical Therapy

## 2021-02-08 ENCOUNTER — Encounter: Payer: Self-pay | Admitting: Physical Therapy

## 2021-02-08 DIAGNOSIS — R29898 Other symptoms and signs involving the musculoskeletal system: Secondary | ICD-10-CM

## 2021-02-08 DIAGNOSIS — M25561 Pain in right knee: Secondary | ICD-10-CM

## 2021-02-08 DIAGNOSIS — R600 Localized edema: Secondary | ICD-10-CM | POA: Diagnosis not present

## 2021-02-08 DIAGNOSIS — M6281 Muscle weakness (generalized): Secondary | ICD-10-CM | POA: Diagnosis present

## 2021-02-08 NOTE — Patient Instructions (Signed)
Access Code: [email or web address] URL: [email or web address] Date: 02/08/2021 Prepared by: Glenn [place]  Exercises Supine Quad Set - 1 x daily - 7 x weekly - 3 sets - 10 reps Active Straight Leg Raise with Quad Set - 1 x daily - 7 x weekly - 3 sets - 10 reps Long Sitting Calf Stretch with Strap - 1 x daily - 7 x weekly - 3 sets - 1 reps - 20-30 seconds hold Seated Long Arc Quad - 1 x daily - 7 x weekly - 3 sets - 10 reps Seated Knee Flexion Extension AROM - 1 x daily - 7 x weekly - 3 sets - 10 reps Standing Terminal Knee Extension with Resistance - 1 x daily - 7 x weekly - 3 sets - 10 reps Standing Single Leg Stance with Counter Support - 1 x daily - 7 x weekly - 3 sets - 1 reps - up to 20 seconds hold Step Up - 1 x daily - 7 x weekly - 3 sets - 10 reps Lateral Step Ups - 1 x daily - 7 x weekly - 3 sets - 10 reps Prone Knee Extension Hang - 2 x daily - 7 x weekly - 1 sets - 2-3 reps - 30 sec - 2 min hold Seated Hamstring Stretch - 2 x daily - 7 x weekly - 1 sets - 2-3 reps - 30 seconds hold

## 2021-02-08 NOTE — Therapy (Signed)
[place], [place], [address] Phone: [phone]   Fax:  [phone]  Physical Therapy Treatment  Patient Details  Name: Raymond Walsh MRN: [id number] Date of Birth: Aug 01, 1955 Referring Provider (PT): Dorna Leitz   Encounter Date: 02/08/2021   PT End of Session - 02/08/21 1407     Visit Number 3    Number of Visits 12    Date for PT Re-Evaluation 03/14/21    Authorization - Visit Number 3    Progress Note Due on Visit 10    PT Start Time [id number]    PT Stop Time 1445    PT Time Calculation (min) 42 min    Activity Tolerance Patient tolerated treatment well    Behavior During Therapy [facility] for tasks assessed/performed             Past Medical History:  Diagnosis Date   A-fib ([place])    Cancer ([place])    prostate    Past Surgical History:  Procedure Laterality Date   BACK SURGERY      There were no vitals filed for this visit.   Subjective Assessment - 02/08/21 1408     Subjective Pt reports he is having difficulty sleeping, sleeping for only 1 hr at a time. He complains of nausea today.  He has done some of his HEP today.    Patient Stated Goals decrease pain, return working out    Currently in Pain? Yes    Pain Score 5     Pain Location Knee    Pain Orientation Right    Pain Descriptors / Indicators Aching    Aggravating Factors  standing, walking    Pain Relieving Factors ice                [facility] PT Assessment - 02/08/21 0001       Assessment   Medical Diagnosis s/p Rt TKR    Referring Provider (PT) Dorna Leitz    Onset Date/Surgical Date 01/17/21      AROM   Right Knee Extension -8    Right Knee Flexion 112              [facility] Adult PT Treatment/Exercise - 02/08/21 0001       Knee/Hip Exercises: Stretches   Passive Hamstring Stretch Right;2 reps;30 seconds    Passive Hamstring Stretch Limitations seated, hip hinge with overpressure from pt    Medical sales representative reps;Left;1 rep;30 seconds      Knee/Hip Exercises: Aerobic   Recumbent Bike partial to slow full backwards revolutions x 7 min for ROM    Other Aerobic single laps around gym to assess response to exercises. 4      Knee/Hip Exercises: Supine   Heel Slides Limitations 2 reps    Bridges 1 set;10 reps   5 sec hold   Straight Leg Raises Strengthening;Right;2 sets;5 reps      Knee/Hip Exercises: Prone   Hamstring Curl 5 reps;2 sets    Prone Knee Hang --   30 sec x 2   Prone Knee Hang Limitations ankles off end of table    Other Prone Exercises prone TKE with Rt toes tucked x 10 sec x 5 reps      Vasopneumatic   Number Minutes Vasopneumatic  10 minutes    Vasopnuematic Location  Knee   Rt   Vasopneumatic Pressure Low    Vasopneumatic Temperature  34  PT Education - 02/08/21 1439     Education Details updated HEP    Person(s) Educated Patient    Methods Explanation;Handout    Comprehension Verbalized understanding;Returned demonstration                 PT Long Term Goals - 01/31/21 1228       PT LONG TERM GOAL #1   Title Pt will be independent in HEP    Time 6    Period Weeks    Status New    Target Date 03/14/21      PT LONG TERM GOAL #2   Title Pt will improve FOTO to 74 to demo improved functional mobility    Time 6    Period Weeks    Status New    Target Date 03/14/21      PT LONG TERM GOAL #3   Title Pt will improve Rt knee extension AROM to 0 degrees to perform gait without pain    Time 6    Period Weeks    Status New    Target Date 03/14/21      PT LONG TERM GOAL #4   Title Pt will improve Rt LE strength to 4+/5 to perform gait and standing with decreased pain    Time 6    Period Weeks    Status New    Target Date 03/14/21      PT LONG TERM GOAL #5   Title Pt will perform gait without AD in community with pain <= 2/10    Time 6    Period Weeks    Status New    Target Date 03/14/21                    Plan - 02/08/21 1424     Clinical Impression Statement Pt is now 3 wks s/p TKA. Session focused on Rt knee ext.  Pt tolerated exercises without increase in nausea. He had some increase in discomfort with prone knee hang. He reported reduction in pain after completion of exercises and vaso.  Pt progressing well towards goals.    Rehab Potential Good    PT Frequency 2x / week    PT Duration 6 weeks    PT Treatment/Interventions Vasopneumatic Device;Dry needling;Passive range of motion;Taping;Manual techniques;Patient/family education;Therapeutic activities;Therapeutic exercise;Neuromuscular re-education;Balance training;Gait training;Stair training;Moist Heat;Cryotherapy;Aquatic Therapy;Iontophoresis '4mg'$ /ml Dexamethasone;Electrical Stimulation    PT Next Visit Plan continue to progress strengthening and ROM.    PT Home Exercise Plan [id number]    Consulted and Agree with Plan of Care Patient             Patient will benefit from skilled therapeutic intervention in order to improve the following deficits and impairments:  Pain, Decreased activity tolerance, Decreased strength, Increased edema, Decreased range of motion, Abnormal gait, Difficulty walking, Decreased mobility  Visit Diagnosis: Muscle weakness (generalized)  Acute pain of right knee  Other symptoms and signs involving the musculoskeletal system  Localized edema     Problem List Patient Active Problem List   Diagnosis Date Noted   Greater trochanteric pain syndrome of left lower extremity 10/20/2019   Kerin Perna, PTA 02/08/21 2:48 PM  [place], [place], [address] Phone: [phone]   Fax:  [phone]  Name: Raymond Walsh MRN: [id number] Date of Birth: 10/23/1955

## 2021-02-13 ENCOUNTER — Ambulatory Visit (INDEPENDENT_AMBULATORY_CARE_PROVIDER_SITE_OTHER): Payer: Medicare Other | Admitting: Physical Therapy

## 2021-02-13 ENCOUNTER — Other Ambulatory Visit: Payer: Self-pay

## 2021-02-13 DIAGNOSIS — R29898 Other symptoms and signs involving the musculoskeletal system: Secondary | ICD-10-CM

## 2021-02-13 DIAGNOSIS — M25561 Pain in right knee: Secondary | ICD-10-CM

## 2021-02-13 DIAGNOSIS — R600 Localized edema: Secondary | ICD-10-CM | POA: Diagnosis not present

## 2021-02-13 DIAGNOSIS — M6281 Muscle weakness (generalized): Secondary | ICD-10-CM | POA: Diagnosis present

## 2021-02-13 NOTE — Patient Instructions (Signed)
Access Code: [email or web address] URL: [email or web address] Date: 02/13/2021 Prepared by: Herman  Exercises Seated Long Arc Quad - 3 x daily - 7 x weekly - 1 sets - 3-5 reps Standing Terminal Knee Extension with Resistance - 2 x daily - 7 x weekly - 1 sets - 5-10 reps - 5 sec hold Step Up - 1 x daily - 7 x weekly - 2 sets - 10 reps Lateral Step Ups - 1 x daily - 7 x weekly - 2 sets - 10 reps Seated Hamstring Stretch - 2 x daily - 7 x weekly - 1 sets - 2-3 reps - 30 seconds hold Prone Knee Extension Hang - 2 x daily - 7 x weekly - 1 sets - 2-3 reps - 30 sec - 2 min hold Prone Quadriceps Set - 1 x daily - 7 x weekly - 3 sets - 5-10 reps - 5 seconds hold Sit to Stand Without Arm Support - 1 x daily - 7 x weekly - 3 sets - 10 reps Lateral Lunge Adductor Stretch with Counter Support - 1 x daily - 7 x weekly - 3 sets - 10 reps

## 2021-02-13 NOTE — Therapy (Signed)
[place] [facility] [place], [place], [address] Phone: [phone]   Fax:  [phone]  Physical Therapy Treatment  Patient Details  Name: Raymond Walsh MRN: [id number] Date of Birth: 1956/03/14 Referring Provider (PT): Dorna Leitz   Encounter Date: 02/13/2021   PT End of Session - 02/13/21 1201     Visit Number 4    Number of Visits 12    Date for PT Re-Evaluation 03/14/21    Authorization - Visit Number 4    Progress Note Due on Visit 10    PT Start Time [id number]    PT Stop Time 1240    PT Time Calculation (min) 49 min    Activity Tolerance Patient tolerated treatment well    Behavior During Therapy [facility] for tasks assessed/performed             Past Medical History:  Diagnosis Date   A-fib ([place])    Cancer ([place])    prostate    Past Surgical History:  Procedure Laterality Date   BACK SURGERY      There were no vitals filed for this visit.   Subjective Assessment - 02/13/21 1154     Subjective "I had a heck of a day yesterday".  He went to gym on Sunday and lifted light weights. He verbalizes dicouragement in constant pain in Rt knee.    Patient Stated Goals decrease pain, return working out    Currently in Pain? Yes    Pain Score 6     Pain Location Knee    Pain Orientation Right    Pain Descriptors / Indicators Aching    Aggravating Factors  standing, walking    Pain Relieving Factors ice                [facility] PT Assessment - 02/13/21 0001       Assessment   Medical Diagnosis s/p Rt TKR    Referring Provider (PT) Dorna Leitz    Onset Date/Surgical Date 01/17/21    Next MD Visit 02/27/21               [facility] Adult PT Treatment/Exercise - 02/13/21 0001       Self-Care   Self-Care Other Self-Care Comments    Other Self-Care Comments  pt instructed in scar mobilization, and self massage with roller stick.  Pt returned demo with cues.      Knee/Hip Exercises: Stretches    Passive Hamstring Stretch Right;2 reps;10 seconds    Passive Hamstring Stretch Limitations seated, hip hinge with overpressure from pt    Gastroc Stretch Right;2 reps;20 seconds    Other Knee/Hip Stretches forward lunge with foot on 12" step and low black mat, followed by hamstring stretch RLE with overpressure from pt, mulitple reps.    Other Knee/Hip Stretches Rt adductor stretch with hands on counter x 15 secs x 3.      Knee/Hip Exercises: Aerobic   Recumbent Bike partial to full revolutions - L1 - total of 6 min for ROM of Rt knee    Other Aerobic single laps around gym to assess response to exercises.      Knee/Hip Exercises: Seated   Sit to Sand 1 set;without UE support;10 reps   feet even and Lt ft forward; cues to control descent.     Knee/Hip Exercises: Prone   Hamstring Curl 5 reps;2 sets    Prone Knee Hang --   30 sec x 2     Vasopneumatic  Number Minutes Vasopneumatic  10 minutes    Vasopnuematic Location  Knee   Rt   Vasopneumatic Pressure Low    Vasopneumatic Temperature  34      Manual Therapy   Manual Therapy Taping    Kinesiotex Edema      Kinesiotix   Edema I strips of Reg Rock tape applied to Rt medial knee at joint line with 50% stretch to decompress area of pain and increase proprioception.                    PT Education - 02/13/21 1252     Education Details updated HEP, verbally educated on ktape safe removal and rationale.    Person(s) Educated Patient    Methods Explanation;Handout    Comprehension Verbalized understanding                 PT Long Term Goals - 01/31/21 1228       PT LONG TERM GOAL #1   Title Pt will be independent in HEP    Time 6    Period Weeks    Status New    Target Date 03/14/21      PT LONG TERM GOAL #2   Title Pt will improve FOTO to 74 to demo improved functional mobility    Time 6    Period Weeks    Status New    Target Date 03/14/21      PT LONG TERM GOAL #3   Title Pt will improve Rt knee  extension AROM to 0 degrees to perform gait without pain    Time 6    Period Weeks    Status New    Target Date 03/14/21      PT LONG TERM GOAL #4   Title Pt will improve Rt LE strength to 4+/5 to perform gait and standing with decreased pain    Time 6    Period Weeks    Status New    Target Date 03/14/21      PT LONG TERM GOAL #5   Title Pt will perform gait without AD in community with pain <= 2/10    Time 6    Period Weeks    Status New    Target Date 03/14/21                   Plan - 02/13/21 1254     Clinical Impression Statement Time spent encouraging pt to not overdo it outside of therapy sessions as this may be the cause of his increased pain and swelling (ie: using LE wt machines). His Rt knee ROM is gradually progressing.  Continued focus on improving Rt knee ext ROM. Modified his HEP. Pt reported reduction of pain in knee with exercises and further reduction with use of vaso at end of session.   Progressing towards goals.    Rehab Potential Good    PT Frequency 2x / week    PT Duration 6 weeks    PT Treatment/Interventions Vasopneumatic Device;Dry needling;Passive range of motion;Taping;Manual techniques;Patient/family education;Therapeutic activities;Therapeutic exercise;Neuromuscular re-education;Balance training;Gait training;Stair training;Moist Heat;Cryotherapy;Aquatic Therapy;Iontophoresis '4mg'$ /ml Dexamethasone;Electrical Stimulation    PT Next Visit Plan continue to progress strengthening and ROM for Rt knee.    PT Home Exercise Plan [id number]    Consulted and Agree with Plan of Care Patient             Patient will benefit from skilled therapeutic intervention in order to improve the following deficits and  impairments:  Pain, Decreased activity tolerance, Decreased strength, Increased edema, Decreased range of motion, Abnormal gait, Difficulty walking, Decreased mobility  Visit Diagnosis: Muscle weakness (generalized)  Acute pain of right  knee  Other symptoms and signs involving the musculoskeletal system  Localized edema     Problem List Patient Active Problem List   Diagnosis Date Noted   Greater trochanteric pain syndrome of left lower extremity 10/20/2019   Kerin Perna, PTA 02/13/21 12:58 PM   [place], [place], [address] Phone: [phone]   Fax:  [phone]  Name: Raymond Walsh MRN: [id number] Date of Birth: 20-Jul-1955

## 2021-02-15 ENCOUNTER — Other Ambulatory Visit: Payer: Self-pay

## 2021-02-15 ENCOUNTER — Encounter: Payer: Self-pay | Admitting: Physical Therapy

## 2021-02-15 ENCOUNTER — Ambulatory Visit (INDEPENDENT_AMBULATORY_CARE_PROVIDER_SITE_OTHER): Payer: Medicare Other | Admitting: Physical Therapy

## 2021-02-15 DIAGNOSIS — M25561 Pain in right knee: Secondary | ICD-10-CM | POA: Diagnosis not present

## 2021-02-15 DIAGNOSIS — R600 Localized edema: Secondary | ICD-10-CM | POA: Diagnosis not present

## 2021-02-15 DIAGNOSIS — M6281 Muscle weakness (generalized): Secondary | ICD-10-CM | POA: Diagnosis present

## 2021-02-15 DIAGNOSIS — R29898 Other symptoms and signs involving the musculoskeletal system: Secondary | ICD-10-CM | POA: Diagnosis not present

## 2021-02-15 NOTE — Therapy (Signed)
[place], [place], [address] Phone: [phone]   Fax:  [phone]  Physical Therapy Treatment  Patient Details  Name: Raymond Walsh MRN: [id number] Date of Birth: [birth year] Referring Provider (PT): Dorna Leitz   Encounter Date: 02/15/2021   PT End of Session - 02/15/21 1408     Visit Number 5    Number of Visits 12    Date for PT Re-Evaluation 03/14/21    Authorization - Visit Number 5    Progress Note Due on Visit 10    PT Start Time 1401    PT Stop Time 1445    PT Time Calculation (min) 44 min    Activity Tolerance Patient tolerated treatment well    Behavior During Therapy [facility] for tasks assessed/performed             Past Medical History:  Diagnosis Date   A-fib ([place])    Cancer ([place])    prostate    Past Surgical History:  Procedure Laterality Date   BACK SURGERY      There were no vitals filed for this visit.   Subjective Assessment - 02/15/21 1405     Subjective Pt reports that Tuesday night was the first night he has slept through the night.  "I celebrated".  Yesterday was first day he hasn't taken pain medication.    Patient Stated Goals decrease pain, return working out    Currently in Pain? Yes    Pain Score 0-No pain    Pain Location Knee    Pain Orientation Right                [facility] PT Assessment - 02/15/21 0001       Assessment   Medical Diagnosis s/p Rt TKR    Referring Provider (PT) Dorna Leitz    Onset Date/Surgical Date 01/17/21    Next MD Visit 02/27/21      AROM   Right Knee Extension -8    Right Knee Flexion 120   forward lunge with foot on 12" stair.            [place] Adult PT Treatment/Exercise - 02/15/21 0001       Knee/Hip Exercises: Stretches   Passive Hamstring Stretch Right;1 rep;20 seconds   single leg long sitting with overpressure to increase knee ext   Gastroc Stretch 1 rep;30 seconds    Other Knee/Hip Stretches forward  lunge with foot on 12" step  followed by hamstring stretch RLE with overpressure from pt, 5 reps.      Knee/Hip Exercises: Aerobic   Recumbent Bike L2: 5 min for ROM/ warm up .    Other Aerobic single laps around gym to assess response to exercises.      Knee/Hip Exercises: Standing   Heel Raises Both;1 set;10 reps    Heel Raises Limitations slow and controlled, heels off of step    Forward Step Up Right;1 set;10 reps;Hand Hold: 2;Step Height: 6"    Forward Step Up Limitations slow and controlled    Step Down Left;1 set;10 reps;Hand Hold: 2;Step Height: 4"    Step Down Limitations and retro step up with LLE>      Knee/Hip Exercises: Supine   Quad Sets Right;1 set;5 reps   10 sec hold   Bridges 1 set;10 reps      Knee/Hip Exercises: Sidelying   Hip ABduction Right;1 set;10 reps    Hip ABduction Limitations tactile / VC for foot parallel  to table.    Clams RLE x 15      Vasopneumatic   Number Minutes Vasopneumatic  10 minutes    Vasopnuematic Location  Knee   Rt   Vasopneumatic Pressure Medium    Vasopneumatic Temperature  34      Manual Therapy   Manual therapy comments 2 reps of passive ext to Rt knee.      Kinesiotix   Edema tape held, due to oil on leg.  Issued pieces for pt to apply; educated on application.               PT Long Term Goals - 02/15/21 1632       PT LONG TERM GOAL #1   Title Pt will be independent in HEP    Time 6    Period Weeks    Status On-going      PT LONG TERM GOAL #2   Title Pt will improve FOTO to 74 to demo improved functional mobility    Time 6    Period Weeks    Status On-going      PT LONG TERM GOAL #3   Title Pt will improve Rt knee extension AROM to 0 degrees to perform gait without pain    Time 6    Period Weeks    Status On-going      PT LONG TERM GOAL #4   Title Pt will improve Rt LE strength to 4+/5 to perform gait and standing with decreased pain    Time 6    Period Weeks    Status On-going      PT LONG TERM  GOAL #5   Title Pt will perform gait without AD in community with pain <= 2/10    Time 6    Period Weeks    Status Partially Met                   Plan - 02/15/21 1439     Clinical Impression Statement Pt has gained 8 deg of Rt knee flexion AROM; continues to be limited with Rt knee ext ROM.  He tolerated all exercises well without increase in pain. Progressing well towards goals with improved mobility.    Rehab Potential Good    PT Frequency 2x / week    PT Duration 6 weeks    PT Treatment/Interventions Vasopneumatic Device;Dry needling;Passive range of motion;Taping;Manual techniques;Patient/family education;Therapeutic activities;Therapeutic exercise;Neuromuscular re-education;Balance training;Gait training;Stair training;Moist Heat;Cryotherapy;Aquatic Therapy;Iontophoresis 11m/ml Dexamethasone;Electrical Stimulation    PT Next Visit Plan continue to progress strengthening and ROM for Rt knee.    PT Home Exercise Plan [id number]   Consulted and Agree with Plan of Care Patient             Patient will benefit from skilled therapeutic intervention in order to improve the following deficits and impairments:  Pain, Decreased activity tolerance, Decreased strength, Increased edema, Decreased range of motion, Abnormal gait, Difficulty walking, Decreased mobility  Visit Diagnosis: Muscle weakness (generalized)  Acute pain of right knee  Other symptoms and signs involving the musculoskeletal system  Localized edema     Problem List Patient Active Problem List   Diagnosis Date Noted   Greater trochanteric pain syndrome of left lower extremity 10/20/2019   JKerin Perna PTA 02/15/21 4:33 PM   [place] [address]Phone: [phone]  Fax:  [phone] Name: HSeverino PaoloMRN: [id number] of Birth: 807-31-1957

## 2021-02-20 ENCOUNTER — Encounter: Payer: Medicare Other | Admitting: Physical Therapy

## 2021-02-22 ENCOUNTER — Ambulatory Visit (INDEPENDENT_AMBULATORY_CARE_PROVIDER_SITE_OTHER): Payer: Medicare Other | Admitting: Physical Therapy

## 2021-02-22 ENCOUNTER — Other Ambulatory Visit: Payer: Self-pay

## 2021-02-22 DIAGNOSIS — R600 Localized edema: Secondary | ICD-10-CM | POA: Diagnosis not present

## 2021-02-22 DIAGNOSIS — M25561 Pain in right knee: Secondary | ICD-10-CM | POA: Diagnosis not present

## 2021-02-22 DIAGNOSIS — R29898 Other symptoms and signs involving the musculoskeletal system: Secondary | ICD-10-CM | POA: Diagnosis not present

## 2021-02-22 DIAGNOSIS — M6281 Muscle weakness (generalized): Secondary | ICD-10-CM | POA: Diagnosis present

## 2021-02-22 NOTE — Therapy (Signed)
[place], [place], [address] Phone: [phone]   Fax:  [phone]  Physical Therapy Treatment  Patient Details  Name: Raymond Walsh MRN: [id number] Date of Birth: May 05, 1956 Referring Provider (PT): Dorna Leitz   Encounter Date: 02/22/2021   PT End of Session - 02/22/21 1410     Visit Number 6    Number of Visits 12    Date for PT Re-Evaluation 03/14/21    Authorization - Visit Number 6    Progress Note Due on Visit 10    PT Start Time 9323    PT Stop Time 1452    PT Time Calculation (min) 48 min    Activity Tolerance Patient tolerated treatment well    Behavior During Therapy [facility] for tasks assessed/performed             Past Medical History:  Diagnosis Date   A-fib ([place])    Cancer [facility])    prostate    Past Surgical History:  Procedure Laterality Date   BACK SURGERY      There were no vitals filed for this visit.   Subjective Assessment - 02/22/21 1407     Subjective "I have no energy".  Pt reports he hasn't gone to the gym for 5 wks, and he can tell the difference.  He reports that Tuesday he spent 2 hrs standing/ walking at job site and his knee felt very painful afterwards.    Patient Stated Goals decrease pain, return working out    Currently in Pain? Yes    Pain Score 3     Pain Location Knee    Pain Orientation Right    Pain Descriptors / Indicators Aching    Aggravating Factors  prlonged standing    Pain Relieving Factors ice                [facility] PT Assessment - 02/22/21 0001       Assessment   Medical Diagnosis s/p Rt TKR    Referring Provider (PT) Dorna Leitz    Onset Date/Surgical Date 01/17/21    Next MD Visit 02/27/21      Observation/Other Assessments   Focus on Therapeutic Outcomes (FOTO)  76 functional status.      AROM   Right Knee Extension -6    Right Knee Flexion 125               [facility] Adult PT Treatment/Exercise - 02/22/21  0001       Knee/Hip Exercises: Stretches   Passive Hamstring Stretch Right;1 rep;20 seconds   single leg long sitting with overpressure to increase knee ext   Gastroc Stretch Right;2 reps;Left;1 rep;30 seconds    Other Knee/Hip Stretches forward lunge with foot on 12" step  followed by hamstring stretch RLE with overpressure from pt, 5 reps.      Knee/Hip Exercises: Aerobic   Recumbent Bike L2: 5 min for ROM/ warm up .    Other Aerobic single laps around gym to assess response to exercises.      Knee/Hip Exercises: Standing   Lateral Step Up Right;1 set;10 reps;Step Height: 8"    Forward Step Up Right;1 set;10 reps;Step Height: 8"    Step Down Left;1 set;5 reps;Step Height: 8"    Functional Squat 1 set;10 reps   lifting 10# KB   SLS on blue pad with intermittent UE to steady x 20 sec x 2 each leg.    Other Standing Knee Exercises  split squats with limited depth x 10 each leg forward, tactile cues for improve form.    Other Standing Knee Exercises forward step up onto bosu with intermittent UE to steady x 5 reps      Vasopneumatic   Number Minutes Vasopneumatic  10 minutes    Vasopnuematic Location  Knee   Rt   Vasopneumatic Pressure Medium    Vasopneumatic Temperature  34      Manual Therapy   Manual therapy comments I strip of reg Rock tape applied in zig zag pattern over Rt knee incision to assist with scar management and additional piece over pes anserine to decompress and decrease pain.                         PT Long Term Goals - 02/22/21 1447       PT LONG TERM GOAL #1   Title Pt will be independent in HEP    Time 6    Period Weeks    Status On-going      PT LONG TERM GOAL #2   Title Pt will improve FOTO to 74 to demo improved functional mobility    Time 6    Period Weeks    Status Achieved      PT LONG TERM GOAL #3   Title Pt will improve Rt knee extension AROM to 0 degrees to perform gait without pain    Time 6    Period Weeks    Status  On-going      PT LONG TERM GOAL #4   Title Pt will improve Rt LE strength to 4+/5 to perform gait and standing with decreased pain    Time 6    Period Weeks    Status On-going      PT LONG TERM GOAL #5   Title Pt will perform gait without AD in community with pain <= 2/10    Time 6    Period Weeks    Status Achieved                   Plan - 02/22/21 1444     Clinical Impression Statement Continued improvement in Rt knee AROM.  He tolerated all exercises without increase in symptoms.  Encouraged pt to return to gym for bicycle (ROM) and UE lifting to assist with return to previous level of function. He has met FOTO goal and is progressing well towards remaining goals.    Rehab Potential Good    PT Frequency 2x / week    PT Duration 6 weeks    PT Treatment/Interventions Vasopneumatic Device;Dry needling;Passive range of motion;Taping;Manual techniques;Patient/family education;Therapeutic activities;Therapeutic exercise;Neuromuscular re-education;Balance training;Gait training;Stair training;Moist Heat;Cryotherapy;Aquatic Therapy;Iontophoresis 70m/ml Dexamethasone;Electrical Stimulation    PT Next Visit Plan continue to progress strengthening and ROM for Rt knee.    PT Home Exercise Plan [id number]   Consulted and Agree with Plan of Care Patient             Patient will benefit from skilled therapeutic intervention in order to improve the following deficits and impairments:  Pain, Decreased activity tolerance, Decreased strength, Increased edema, Decreased range of motion, Abnormal gait, Difficulty walking, Decreased mobility  Visit Diagnosis: Muscle weakness (generalized)  Acute pain of right knee  Other symptoms and signs involving the musculoskeletal system  Localized edema     Problem List Patient Active Problem List   Diagnosis Date Noted   Greater trochanteric pain syndrome of left lower  extremity 10/20/2019    Kerin Perna, PTA 02/22/21 3:05  PM   [place], [place], [address] Phone: [phone]   Fax:  [phone]  Name: Raymond Walsh MRN: [id number] Date of Birth: 1955/07/11

## 2021-02-27 ENCOUNTER — Other Ambulatory Visit: Payer: Self-pay

## 2021-02-27 ENCOUNTER — Ambulatory Visit (INDEPENDENT_AMBULATORY_CARE_PROVIDER_SITE_OTHER): Payer: Medicare Other | Admitting: Physical Therapy

## 2021-02-27 DIAGNOSIS — M6281 Muscle weakness (generalized): Secondary | ICD-10-CM

## 2021-02-27 DIAGNOSIS — M25561 Pain in right knee: Secondary | ICD-10-CM | POA: Diagnosis not present

## 2021-02-27 DIAGNOSIS — R29898 Other symptoms and signs involving the musculoskeletal system: Secondary | ICD-10-CM | POA: Diagnosis not present

## 2021-02-27 DIAGNOSIS — R600 Localized edema: Secondary | ICD-10-CM | POA: Diagnosis not present

## 2021-02-27 NOTE — Therapy (Signed)
Raymond Walsh, [place], [address] Phone: [phone]   Fax:  [phone]  Physical Therapy Treatment  Patient Details  Name: Raymond Walsh MRN: [id number] Date of Birth: [birth year] Referring Provider (PT): Dorna Leitz   Encounter Date: 02/27/2021   PT End of Session - 02/27/21 1146     Visit Number 7    Number of Visits 12    Date for PT Re-Evaluation 03/14/21    Authorization - Visit Number 7    Progress Note Due on Visit 10    PT Start Time 1105    PT Stop Time 1143    PT Time Calculation (min) 38 min    Activity Tolerance Patient tolerated treatment well    Behavior During Therapy [facility] for tasks assessed/performed             Past Medical History:  Diagnosis Date   A-fib ([place])    Cancer ([place])    prostate    Past Surgical History:  Procedure Laterality Date   BACK SURGERY      There were no vitals filed for this visit.   Subjective Assessment - 02/27/21 1113     Subjective Pt states he worked out at [employer] and is sore but does not have increased pain. Sees MD later today    Patient Stated Goals decrease pain, return working out    Currently in Pain? Yes    Pain Score 1     Pain Location Knee    Pain Orientation Right    Pain Descriptors / Indicators Sore                [facility] PT Assessment - 02/27/21 0001       Ambulation/Gait   Stairs Yes    Stair Management Technique No rails;Alternating pattern;Forwards    Number of Stairs 15    Height of Stairs 6                           [facility] Adult PT Treatment/Exercise - 02/27/21 0001       Knee/Hip Exercises: Stretches   Passive Hamstring Stretch Right;1 rep;20 seconds   single leg long sitting with overpressure to increase knee ext   Gastroc Stretch Right;2 reps;Left;1 rep;30 seconds    Other Knee/Hip Stretches forward lunge with foot on 12" step  followed by hamstring stretch RLE with overpressure  from pt, 5 reps.      Knee/Hip Exercises: Aerobic   Recumbent Bike L2: 5 min for ROM/ warm up .    Other Aerobic single laps around gym to assess response to exercises.      Knee/Hip Exercises: Standing   Terminal Knee Extension Strengthening;Right;15 reps    Theraband Level (Terminal Knee Extension) Level 4 (Blue)    Stairs stair negotiation without handrails, alternating pattern with no gait abnormality or pain    SLS on foam 30 sec x 2 with intermittent UE support      Manual Therapy   Joint Mobilization grade 2-3 mobs to improve extension      Kinesiotix   Edema tape held due to MD appointment later today and MD needing to see incision                         PT Long Term Goals - 02/22/21 1447       PT LONG TERM GOAL #1   Title  Pt will be independent in HEP    Time 6    Period Weeks    Status On-going      PT LONG TERM GOAL #2   Title Pt will improve FOTO to 74 to demo improved functional mobility    Time 6    Period Weeks    Status Achieved      PT LONG TERM GOAL #3   Title Pt will improve Rt knee extension AROM to 0 degrees to perform gait without pain    Time 6    Period Weeks    Status On-going      PT LONG TERM GOAL #4   Title Pt will improve Rt LE strength to 4+/5 to perform gait and standing with decreased pain    Time 6    Period Weeks    Status On-going      PT LONG TERM GOAL #5   Title Pt will perform gait without AD in community with pain <= 2/10    Time 6    Period Weeks    Status Achieved                   Plan - 02/27/21 1146     Clinical Impression Statement Pt with significant decrease in pain and improved ROM and strength. Pt able to return to gym with no increase in pain, just muscle soreness. Pt is progressing well towards all goals    PT Next Visit Plan progress towards d/c (1-2 more visits). Update HEP    PT Home Exercise Plan [id number]    Consulted and Agree with Plan of Care Patient              Patient will benefit from skilled therapeutic intervention in order to improve the following deficits and impairments:     Visit Diagnosis: Muscle weakness (generalized)  Acute pain of right knee  Other symptoms and signs involving the musculoskeletal system  Localized edema     Problem List Patient Active Problem List   Diagnosis Date Noted   Greater trochanteric pain syndrome of left lower extremity 10/20/2019   Raymond Walsh, PT  Raymond Walsh 02/27/2021, 11:48 AM  [facility] [place], [place], [address] Phone: [phone]   Fax:  [phone]  Name: Raymond Walsh MRN: [id number] Date of Birth: June 16, 1956

## 2021-03-01 ENCOUNTER — Encounter: Payer: Medicare Other | Admitting: Physical Therapy

## 2021-03-07 ENCOUNTER — Encounter: Payer: Self-pay | Admitting: Physical Therapy

## 2021-03-07 ENCOUNTER — Ambulatory Visit (INDEPENDENT_AMBULATORY_CARE_PROVIDER_SITE_OTHER): Payer: Medicare Other | Admitting: Physical Therapy

## 2021-03-07 ENCOUNTER — Other Ambulatory Visit: Payer: Self-pay

## 2021-03-07 DIAGNOSIS — M6281 Muscle weakness (generalized): Secondary | ICD-10-CM | POA: Diagnosis present

## 2021-03-07 DIAGNOSIS — R29898 Other symptoms and signs involving the musculoskeletal system: Secondary | ICD-10-CM

## 2021-03-07 DIAGNOSIS — M25561 Pain in right knee: Secondary | ICD-10-CM

## 2021-03-07 NOTE — Therapy (Signed)
[place], [place], [address] Phone: [phone]   Fax:  [phone]  Physical Therapy Treatment  Patient Details  Name: Raymond Walsh MRN: [id number] Date of Birth: 1956-03-16 Referring Provider (PT): Dorna Leitz   Encounter Date: 03/07/2021   PT End of Session - 03/07/21 1152     Visit Number 8    Number of Visits 12    Date for PT Re-Evaluation 03/14/21    Authorization - Visit Number 8    Progress Note Due on Visit 10    PT Start Time 1147    PT Stop Time 1225    PT Time Calculation (min) 38 min    Activity Tolerance Patient tolerated treatment well    Behavior During Therapy [facility] for tasks assessed/performed             Past Medical History:  Diagnosis Date   A-fib ([place])    Cancer ([place])    prostate    Past Surgical History:  Procedure Laterality Date   BACK SURGERY      There were no vitals filed for this visit.   Subjective Assessment - 03/07/21 1149     Subjective Pt has returned to gym 3-4x/wk; returned to using weights for UE/ LE.  Sleeping better.  "If I sit (over an hour), it (the Rt knee) doesn't like it".    Patient Stated Goals decrease pain, return working out    Currently in Pain? No/denies    Pain Score 0-No pain                [facility] PT Assessment - 03/07/21 0001       Assessment   Medical Diagnosis s/p Rt TKR    Referring Provider (PT) Dorna Leitz    Onset Date/Surgical Date 01/17/21    Next MD Visit 03/2021      AROM   Right Knee Extension -4   in long sitting with quad set     Strength   Overall Strength Comments Rt hip ext 4/5, hip abdct 4+/5, hip flex 5/5,    Right Knee Flexion 5/5    Right Knee Extension 5/5    Left Knee Flexion 5/5    Left Knee Extension 5/5              [facility] Adult PT Treatment/Exercise - 03/07/21 0001       Knee/Hip Exercises: Stretches   Passive Hamstring Stretch Right;3 reps;20 seconds    Piriformis  Stretch Right;1 rep;20 seconds    Gastroc Stretch Right;1 rep;20 seconds      Knee/Hip Exercises: Aerobic   Recumbent Bike L2: 3.5 min for ROM/ warm up .      Knee/Hip Exercises: Standing   Functional Squat 1 set;5 reps    Functional Squat Limitations cues to shift wt to RLE, switched to staggered stance.    Other Standing Knee Exercises hip abdct x 5 reps each leg with red band at ankles.  then side stepping with red band at ankles x 20 ft R/L.    Other Standing Knee Exercises split squat with BUE support on elevated surfaces x 5 reps each leg forward with cues on form      Knee/Hip Exercises: Supine   Single Leg Bridge Strengthening;Right;1 set;10 reps      Manual Therapy   Manual therapy comments I strip of reg [place] tape applied in zig zag pattern over Rt knee incision to assist with scar management and additional  piece over pes anserine to decompress and decrease pain.                PT Long Term Goals - 03/07/21 1151       PT LONG TERM GOAL #1   Title Pt will be independent in HEP    Time 6    Period Weeks    Status On-going      PT LONG TERM GOAL #2   Title Pt will improve FOTO to 74 to demo improved functional mobility    Time 6    Period Weeks    Status Achieved      PT LONG TERM GOAL #3   Title Pt will improve Rt knee extension AROM to 0 degrees to perform gait without pain    Time 6    Period Weeks    Status On-going      PT LONG TERM GOAL #4   Title Pt will improve Rt LE strength to 4+/5 to perform gait and standing with decreased pain    Time 6    Period Weeks    Status Partially Met      PT LONG TERM GOAL #5   Title Pt will perform gait without AD in community with pain <= 2/10    Time 6    Period Weeks    Status Achieved                   Plan - 03/07/21 1230     Clinical Impression Statement Pt demonstrated improved RLE strength; some weakness noted in Rt hip ext and abdct.  Continues to lack full knee ext ROM on Rt knee. He  weight shifts to Lt during squats; improved with staggered stance, left foot forward.  Pt to work on [employer] and return for progression of exercises in 2 wks.    PT Frequency 2x / week    PT Treatment/Interventions Vasopneumatic Device;Dry needling;Passive range of motion;Taping;Manual techniques;Patient/family education;Therapeutic activities;Therapeutic exercise;Neuromuscular re-education;Balance training;Gait training;Stair training;Moist Heat;Cryotherapy;Aquatic Therapy;Iontophoresis 67m/ml Dexamethasone;Electrical Stimulation    PT Next Visit Plan assess readiness for d/c; progress HEP  (test hip abdct/ext MMT, knee ext ROM)    PT Home Exercise Plan [email or web address]   Consulted and Agree with Plan of Care Patient             Patient will benefit from skilled therapeutic intervention in order to improve the following deficits and impairments:  Pain, Decreased activity tolerance, Decreased strength, Increased edema, Decreased range of motion, Abnormal gait, Difficulty walking, Decreased mobility  Visit Diagnosis: Muscle weakness (generalized)  Acute pain of right knee  Other symptoms and signs involving the musculoskeletal system     Problem List Patient Active Problem List   Diagnosis Date Noted   Greater trochanteric pain syndrome of left lower extremity 10/20/2019    JKerin Perna PTA 03/07/21 12:52 PM  [place] [address]Phone: [phone]  Fax:  [phone] Name: Raymond AllmanMRN: [id number] of Birth: 8Feb 16, 1957

## 2021-03-21 ENCOUNTER — Other Ambulatory Visit: Payer: Self-pay

## 2021-03-21 ENCOUNTER — Ambulatory Visit (INDEPENDENT_AMBULATORY_CARE_PROVIDER_SITE_OTHER): Payer: Medicare Other | Admitting: Physical Therapy

## 2021-03-21 ENCOUNTER — Encounter: Payer: Self-pay | Admitting: Physical Therapy

## 2021-03-21 DIAGNOSIS — R29898 Other symptoms and signs involving the musculoskeletal system: Secondary | ICD-10-CM

## 2021-03-21 DIAGNOSIS — M6281 Muscle weakness (generalized): Secondary | ICD-10-CM

## 2021-03-21 DIAGNOSIS — M25561 Pain in right knee: Secondary | ICD-10-CM | POA: Diagnosis not present

## 2021-03-21 NOTE — Therapy (Addendum)
[place], [place], [address] Phone: [phone]   Fax:  [phone]  Physical Therapy Treatment and Discharge  Patient Details  Name: Raymond Walsh MRN: [id number] Date of Birth: 16-Aug-1955 Referring Provider (PT): Dorna Leitz   Encounter Date: 03/21/2021   PT End of Session - 03/21/21 1200     Visit Number 9    Number of Visits 12    Date for PT Re-Evaluation 03/14/21    Authorization - Visit Number 9    Progress Note Due on Visit 10    PT Start Time 5397    PT Stop Time 1225    PT Time Calculation (min) 32 min    Activity Tolerance Patient tolerated treatment well    Behavior During Therapy [facility] for tasks assessed/performed             Past Medical History:  Diagnosis Date   A-fib ([place])    Cancer ([place])    prostate    Past Surgical History:  Procedure Laterality Date   BACK SURGERY      There were no vitals filed for this visit.   Subjective Assessment - 03/21/21 1203     Subjective Pt is back to lifting weights in gym.  "I can come down a lot further (in leg press) than I could before the operation".  He is pleased with level of function.    Currently in Pain? No/denies    Pain Score 0-No pain                [facility] PT Assessment - 03/21/21 0001       Assessment   Medical Diagnosis s/p Rt TKR    Referring Provider (PT) Dorna Leitz    Onset Date/Surgical Date 01/17/21      AROM   Right Knee Extension -4    Right Knee Flexion 125      Strength   Overall Strength Comments Rt hip ext 4/5+, hip abdct 4+/5,               [facility] Adult PT Treatment/Exercise - 03/21/21 0001       Knee/Hip Exercises: Stretches   Passive Hamstring Stretch Right;2 reps;Left;1 rep;30 seconds    Piriformis Stretch Right;1 rep;30 seconds      Knee/Hip Exercises: Aerobic   Recumbent Bike L2-4 x 4.5 min for warm up.      Knee/Hip Exercises: Standing   Functional Squat 1 set;10  reps    Functional Squat Limitations cues to shift wt to RLE    Other Standing Knee Exercises Hip abdct with blue band at ankle (simulate cable system) x 10 RLE, 5 reps LLE.  Pt shown hip ext with same band.    Other Standing Knee Exercises split squat x 2 reps each leg forward (for demo of how he's performing it at home)               PT Long Term Goals - 03/21/21 1229       PT LONG TERM GOAL #1   Title Pt will be independent in HEP    Time 6    Period Weeks    Status Achieved      PT LONG TERM GOAL #2   Title Pt will improve FOTO to 74 to demo improved functional mobility    Time 6    Period Weeks    Status Achieved      PT LONG TERM GOAL #3  Title Pt will improve Rt knee extension AROM to 0 degrees to perform gait without pain    Time 6    Period Weeks    Status Not Met      PT LONG TERM GOAL #4   Title Pt will improve Rt LE strength to 4+/5 to perform gait and standing with decreased pain    Time 6    Period Weeks    Status Achieved      PT LONG TERM GOAL #5   Title Pt will perform gait without AD in community with pain <= 2/10    Time 6    Period Weeks    Status Achieved                   Plan - 03/21/21 1259     Clinical Impression Statement Rt knee ROM similar to last session 4-125; pt has not met his ROM goal but is pleased with level of function.  Updated HEP to utilize gym equipment he has at home to address continued strength deficit in Rt hip (abdct/ ext).  Pt has met all but one goal and verbalized readiness to d/c to HEP.    PT Frequency 2x / week    PT Treatment/Interventions Vasopneumatic Device;Dry needling;Passive range of motion;Taping;Manual techniques;Patient/family education;Therapeutic activities;Therapeutic exercise;Neuromuscular re-education;Balance training;Gait training;Stair training;Moist Heat;Cryotherapy;Aquatic Therapy;Iontophoresis 48m/ml Dexamethasone;Electrical Stimulation    PT Next Visit Plan d/c.    PT Home Exercise  Plan [email or web address]   Consulted and Agree with Plan of Care Patient             Patient will benefit from skilled therapeutic intervention in order to improve the following deficits and impairments:  Pain, Decreased activity tolerance, Decreased strength, Increased edema, Decreased range of motion, Abnormal gait, Difficulty walking, Decreased mobility  Visit Diagnosis: Muscle weakness (generalized)  Acute pain of right knee  Other symptoms and signs involving the musculoskeletal system     Problem List Patient Active Problem List   Diagnosis Date Noted   Greater trochanteric pain syndrome of left lower extremity 10/20/2019   PHYSICAL THERAPY DISCHARGE SUMMARY  Visits from Start of Care: 9  Current functional level related to goals / functional outcomes: Decreased pain, improved strength, balance and gait   Remaining deficits: ROM   Education / Equipment: HEP   Patient agrees to discharge. Patient goals were partially met. Patient is being discharged due to being pleased with the current functional level.  DIsabelle Course PT   JKerin Perna PTA 03/21/21 1:02 PM   [facility] [place] [address]Phone: [phone]  Fax:  [phone] Name: Raymond CaspersMRN: [id number] of Birth: 8Oct 24, 1957

## 2021-03-21 NOTE — Addendum Note (Signed)
Addended by: Kennith Gain on: 03/21/2021 03:10 PM   Modules accepted: Orders

## 2021-03-27 ENCOUNTER — Other Ambulatory Visit: Payer: Self-pay

## 2021-03-27 ENCOUNTER — Encounter: Payer: Self-pay | Admitting: Rehabilitative and Restorative Service Providers"

## 2021-03-27 ENCOUNTER — Ambulatory Visit (INDEPENDENT_AMBULATORY_CARE_PROVIDER_SITE_OTHER): Payer: Medicare Other | Admitting: Rehabilitative and Restorative Service Providers"

## 2021-03-27 DIAGNOSIS — R293 Abnormal posture: Secondary | ICD-10-CM

## 2021-03-27 DIAGNOSIS — G8929 Other chronic pain: Secondary | ICD-10-CM

## 2021-03-27 DIAGNOSIS — M25512 Pain in left shoulder: Secondary | ICD-10-CM | POA: Diagnosis not present

## 2021-03-27 DIAGNOSIS — M6281 Muscle weakness (generalized): Secondary | ICD-10-CM

## 2021-03-27 DIAGNOSIS — R29898 Other symptoms and signs involving the musculoskeletal system: Secondary | ICD-10-CM

## 2021-03-27 NOTE — Patient Instructions (Signed)
Access Code: [email or web address] URL: [email or web address] Date: 03/27/2021 Prepared by: Gillermo Murdoch  Exercises Seated Cervical Retraction - 3 x daily - 7 x weekly - 10 reps - 1 sets Standing Scapular Retraction - 3 x daily - 7 x weekly - 10 reps - 1 sets - 10 hold Shoulder External Rotation and Scapular Retraction - 3 x daily - 7 x weekly - 10 reps - 1 sets - hold Doorway Pec Stretch at 60 Degrees Abduction - 3 x daily - 7 x weekly - 3 reps - 1 sets Doorway Pec Stretch at 90 Degrees Abduction - 3 x daily - 7 x weekly - 3 reps - 1 sets - 30 seconds hold Doorway Pec Stretch at 120 Degrees Abduction - 3 x daily - 7 x weekly - 3 reps - 1 sets - 30 second hold hold Supine Chest Stretch on Foam Roll - 2 x daily - 7 x weekly - 1 sets - 1 reps - 2-5 min sec hold

## 2021-03-27 NOTE — Therapy (Signed)
[place], [place], [address] Phone: [phone]   Fax:  [phone]  Physical Therapy Evaluation  Patient Details  Name: Raymond Walsh MRN: [id number] Date of Birth: [birth year] Referring Provider (PT): Dr [name]   Encounter Date: 03/27/2021   PT End of Session - 03/27/21 1203     Visit Number 1    Number of Visits 12    Date for PT Re-Evaluation 05/08/21    Authorization - Visit Number 1    Progress Note Due on Visit 10    PT Start Time 1117    PT Stop Time 1205    PT Time Calculation (min) 48 min    Activity Tolerance Patient tolerated treatment well             Past Medical History:  Diagnosis Date   A-fib ([place])    Cancer ([place])    prostate    Past Surgical History:  Procedure Laterality Date   BACK SURGERY      There were no vitals filed for this visit.    Subjective Assessment - 03/27/21 1109     Subjective Patient reports that he has had Lt shoulder pain for the past year with no known inkury.    Pertinent History Rt TKA 01/17/21; Rt shoulder scope ~ 15 yrs ago; melanoma; prostate cancer;    Currently in Pain? No/denies    Pain Score 0-No pain    Pain Location Shoulder    Pain Orientation Left    Pain Descriptors / Indicators Sharp    Pain Type Chronic pain    Pain Radiating Towards none    Pain Onset More than a month ago    Pain Frequency Intermittent    Aggravating Factors  elevation of Lt UE    Pain Relieving Factors nothing                [facility] PT Assessment - 03/27/21 0001       Assessment   Medical Diagnosis Lt shoulder osteoarthritis    Referring Provider (PT) Dr [name]    Onset Date/Surgical Date 03/01/20    Hand Dominance Right    Next MD Visit 4 weeks    Prior Therapy here for TKA      Precautions   Precautions None      Restrictions   Weight Bearing Restrictions No      Balance Screen   Has the patient fallen in the  past 6 months No    Has the patient had a decrease in activity level because of a fear of falling?  No    Is the patient reluctant to leave their home because of a fear of falling?  No      Home Ecologist residence    Home Access Stairs to enter    Entrance Stairs-Number of Steps 2    Entrance Stairs-Rails Can reach both    [place] One level      Prior Function   Level of Independence Independent    Vocation Retired    [employer] - working in [employer]    Leisure yard work; pets; sits in soft couch      Observation/Other Assessments   Focus on Therapeutic Outcomes (FOTO)  51      Sensation   Additional Comments WFL's per pt report      Posture/Postural Control   Posture Comments head forward; shoulders rounded  and elevated; head of the humerus anterior in orientation; increased thoracic kyphosis      AROM   Right Shoulder Extension 64 Degrees    Right Shoulder Flexion 138 Degrees    Right Shoulder ABduction 146 Degrees    Right Shoulder Internal Rotation --   thumb T10/11   Right Shoulder External Rotation 90 Degrees    Left Shoulder Extension 50 Degrees    Left Shoulder Flexion 132 Degrees    Left Shoulder ABduction 135 Degrees    Left Shoulder Internal Rotation --   hand to Lt sacrum   Left Shoulder External Rotation 75 Degrees      Strength   Right Shoulder Flexion 5/5    Right Shoulder Extension 5/5    Right Shoulder ABduction 5/5    Right Shoulder Internal Rotation 5/5    Right Shoulder External Rotation 4+/5    Left Shoulder Flexion 5/5    Left Shoulder Extension 5/5    Left Shoulder ABduction 5/5    Left Shoulder Internal Rotation 5/5    Left Shoulder External Rotation 5/5      Palpation   Palpation comment muscular tightness through Lt > Rt pecs; upper trap; leveator; teres                        Objective measurements completed on examination: See above findings.        [place] Adult PT Treatment/Exercise - 03/27/21 0001       Neuro Re-ed    Neuro Re-ed Details  initiated work on posture and alignment engaging posterior shoulder girdle musculature      Shoulder Exercises: Standing   Other Standing Exercises axial extension 10 sec x 5; scap squeeze 10 sec x 5; L's x 5 with foam roll along spine to improve thoracic psoture      Shoulder Exercises: Stretch   Other Shoulder Stretches doorway stretch 30 sec x 1 reps each position    Other Shoulder Stretches supine T on foam roll ~ 1 min                     PT Education - 03/27/21 1153     Education Details HEP POC    Person(s) Educated Patient    Methods Explanation;Demonstration;Tactile cues;Verbal cues;Handout    Comprehension Verbalized understanding;Returned demonstration;Verbal cues required;Tactile cues required                 PT Long Term Goals - 03/27/21 1212       PT LONG TERM GOAL #1   Title Improve posture and alignment with patient to demonstrate improve upright posture with posterior shoulder girdle engaged    Time 6    Period Weeks    Status New    Target Date 05/08/21      PT LONG TERM GOAL #2   Title Increase strength Lt shoulder ER to 5/5 with minimal to no pain    Time 6    Period Weeks    Status New    Target Date 05/08/21      PT LONG TERM GOAL #3   Title Patient to report improved ability to reach overhead functionally    Time 6    Period Weeks    Status New    Target Date 05/08/21      PT LONG TERM GOAL #4   Title Independent in HEP    Time 6    Period Weeks  Status New    Target Date 05/08/21      PT LONG TERM GOAL #5   Title Improve functional limitation score to 72    Time 6    Period Weeks    Status New    Target Date 05/08/21                    Plan - 03/27/21 1204     Clinical Impression Statement Patient presents for evaluation of chronic Lt shoulder pain and shoulder dysfunction. He has poor posture and  alignment; limited ROM and mobility through Lt > Rt shoulder; decreased strength Lt shoudler and decreased functional abilities; pain on a daily basis. Patient will benefit from PT to address problems identified.    Personal Factors and Comorbidities Time since onset of injury/illness/exacerbation    Examination-Activity Limitations Lift;Carry    Examination-Participation Restrictions Occupation;Yard Work    Stability/Clinical Decision Making Stable/Uncomplicated    Designer, jewellery Low    Rehab Potential Good    PT Frequency 2x / week    PT Duration 6 weeks    PT Treatment/Interventions ADLs/Self Care Home Management;Aquatic Therapy;Cryotherapy;Electrical Stimulation;Iontophoresis 4mg /ml Dexamethasone;Moist Heat;Ultrasound;Therapeutic activities;Therapeutic exercise;Balance training;Neuromuscular re-education;Patient/family education;Manual techniques;Passive range of motion;Dry needling;Taping    PT Next Visit Plan review HEP; progress with pec stretch and posterior shoulder girdle strengthening    PT [place] and Agree with Plan of Care Patient             Patient will benefit from skilled therapeutic intervention in order to improve the following deficits and impairments:  Decreased range of motion, Impaired UE functional use, Decreased activity tolerance, Pain, Impaired flexibility, Improper body mechanics, Decreased mobility, Decreased strength, Postural dysfunction  Visit Diagnosis: Muscle weakness (generalized)  Chronic left shoulder pain  Other symptoms and signs involving the musculoskeletal system  Abnormal posture     Problem List Patient Active Problem List   Diagnosis Date Noted   Greater trochanteric pain syndrome of left lower extremity 10/20/2019    Raymond Walsh Nilda Simmer, PT, MPH  03/27/2021, 12:16 PM  [facility] [place], [place], [address] Phone:  [phone]   Fax:  [phone]  Name: Raymond Walsh MRN: [id number] Date of Birth: September 03, 1955

## 2021-04-02 ENCOUNTER — Encounter: Payer: Medicare Other | Admitting: Rehabilitative and Restorative Service Providers"

## 2021-04-05 ENCOUNTER — Other Ambulatory Visit: Payer: Self-pay

## 2021-04-05 ENCOUNTER — Ambulatory Visit (INDEPENDENT_AMBULATORY_CARE_PROVIDER_SITE_OTHER): Payer: Medicare Other | Admitting: Rehabilitative and Restorative Service Providers"

## 2021-04-05 ENCOUNTER — Encounter: Payer: Self-pay | Admitting: Rehabilitative and Restorative Service Providers"

## 2021-04-05 DIAGNOSIS — M6281 Muscle weakness (generalized): Secondary | ICD-10-CM | POA: Diagnosis present

## 2021-04-05 DIAGNOSIS — R29898 Other symptoms and signs involving the musculoskeletal system: Secondary | ICD-10-CM | POA: Diagnosis not present

## 2021-04-05 DIAGNOSIS — M25512 Pain in left shoulder: Secondary | ICD-10-CM | POA: Diagnosis not present

## 2021-04-05 DIAGNOSIS — G8929 Other chronic pain: Secondary | ICD-10-CM | POA: Diagnosis not present

## 2021-04-05 DIAGNOSIS — R293 Abnormal posture: Secondary | ICD-10-CM | POA: Diagnosis not present

## 2021-04-05 NOTE — Patient Instructions (Addendum)
Trigger Point Dry Needling  What is Trigger Point Dry Needling (DN)? DN is a physical therapy technique used to treat muscle pain and dysfunction. Specifically, DN helps deactivate muscle trigger points (muscle knots).  A thin filiform needle is used to penetrate the skin and stimulate the underlying trigger point. The goal is for a local twitch response (LTR) to occur and for the trigger point to relax. No medication of any kind is injected during the procedure.   What Does Trigger Point Dry Needling Feel Like?  The procedure feels different for each individual patient. Some patients report that they do not actually feel the needle enter the skin and overall the process is not painful. Very mild bleeding may occur. However, many patients feel a deep cramping in the muscle in which the needle was inserted. This is the local twitch response.   How Will I feel after the treatment? Soreness is normal, and the onset of soreness may not occur for a few hours. Typically this soreness does not last longer than two days.  Bruising is uncommon, however; ice can be used to decrease any possible bruising.  In rare cases feeling tired or nauseous after the treatment is normal. In addition, your symptoms may get worse before they get better, this period will typically not last longer than 24 hours.   What Can I do After My Treatment? Increase your hydration by drinking more water for the next 24 hours. You may place ice or heat on the areas treated that have become sore, however, do not use heat on inflamed or bruised areas. Heat often brings more relief post needling. You can continue your regular activities, but vigorous activity is not recommended initially after the treatment for 24 hours. DN is best combined with other physical therapy such as strengthening, stretching, and other therapies.   Access Code: [email or web address] URL: [email or web address] Date: 04/05/2021 Prepared by: Gillermo Murdoch  Exercises Seated Cervical Retraction - 3 x daily - 7 x weekly - 10 reps - 1 sets Standing Scapular Retraction - 3 x daily - 7 x weekly - 10 reps - 1 sets - 10 hold Shoulder External Rotation and Scapular Retraction - 3 x daily - 7 x weekly - 10 reps - 1 sets - hold Doorway Pec Stretch at 60 Degrees Abduction - 3 x daily - 7 x weekly - 3 reps - 1 sets Doorway Pec Stretch at 90 Degrees Abduction - 3 x daily - 7 x weekly - 3 reps - 1 sets - 30 seconds hold Doorway Pec Stretch at 120 Degrees Abduction - 3 x daily - 7 x weekly - 3 reps - 1 sets - 30 second hold hold Supine Chest Stretch on Foam Roll - 2 x daily - 7 x weekly - 1 sets - 1 reps - 2-5 min sec hold Shoulder External Rotation and Scapular Retraction with Resistance - 2 x daily - 7 x weekly - 1 sets - 10 reps - 3-5 sec hold Scapular Retraction with Resistance - 1 x daily - 7 x weekly - 1 sets - 20 reps - 10-20 sec hold Single Arm Row with Trunk Rotation - 1 x daily - 7 x weekly - 2-3 sets - 10 reps - 3 sec hold

## 2021-04-05 NOTE — Therapy (Signed)
[place], [place], [address] Phone: [phone]   Fax:  [phone]  Physical Therapy Treatment  Patient Details  Name: Raymond Walsh MRN: [id number] Date of Birth: [birth year] Referring Provider (PT): Dr [name]   Encounter Date: 04/05/2021   PT End of Session - 04/05/21 0936     Visit Number 2    Number of Visits 12    Date for PT Re-Evaluation 05/08/21    Authorization - Visit Number 2    Progress Note Due on Visit 10    PT Start Time 0933    PT Stop Time 1018    PT Time Calculation (min) 45 min    Activity Tolerance Patient tolerated treatment well             Past Medical History:  Diagnosis Date   A-fib ([place])    Cancer ([place])    prostate    Past Surgical History:  Procedure Laterality Date   BACK SURGERY      There were no vitals filed for this visit.   Subjective Assessment - 04/05/21 0939     Subjective Patient reports that he is having a hard time right now. He is working on his exercises every other day.    Currently in Pain? No/denies    Pain Score 0-No pain    Pain Location Shoulder    Pain Orientation Left    Pain Descriptors / Indicators Sharp    Pain Type Chronic pain    Pain Onset More than a month ago    Pain Frequency Intermittent    Aggravating Factors  elevation of Lt UE                [facility] PT Assessment - 04/05/21 0001       Assessment   Medical Diagnosis Lt shoulder osteoarthritis    Referring Provider (PT) Dr [name]    Onset Date/Surgical Date 03/01/20    Hand Dominance Right    Next MD Visit 4 weeks    Prior Therapy here for TKA      Palpation   Palpation comment muscular tightness through Lt > Rt pecs; upper trap; leveator; teres                           [facility] Adult PT Treatment/Exercise - 04/05/21 0001       Neuro Re-ed    Neuro Re-ed Details  poor scapular control with overuse of upper trap and  decreased work of middle and lower trap      Shoulder Exercises: Standing   Row Strengthening;Both;20 reps;Theraband    Theraband Level (Shoulder Row) Level 4 (Blue)    Row Limitations reactive step back focus on pulling shoudlers down and back engaging posterior shoulder girdle musculature    Retraction Strengthening;Both;20 reps;Theraband    Theraband Level (Shoulder Retraction) Level 2 (Red)    Diagonals Limitations step back diagonally working on scapular retraction blue TB 5 sec hold x 15 reps each side    Other Standing Exercises axial extension 10 sec x 5; scap squeeze 10 sec x 5; L's x 5 with foam roll along spine to improve thoracic psoture      Shoulder Exercises: Stretch   Other Shoulder Stretches doorway stretch 30 sec x 1 reps each position                     PT Education -  04/05/21 1001     Education Details HEP DN    Person(s) Educated Patient    Methods Explanation;Demonstration;Tactile cues;Verbal cues;Handout    Comprehension Verbalized understanding;Returned demonstration;Verbal cues required;Tactile cues required                 PT Long Term Goals - 03/27/21 1212       PT LONG TERM GOAL #1   Title Improve posture and alignment with patient to demonstrate improve upright posture with posterior shoulder girdle engaged    Time 6    Period Weeks    Status New    Target Date 05/08/21      PT LONG TERM GOAL #2   Title Increase strength Lt shoulder ER to 5/5 with minimal to no pain    Time 6    Period Weeks    Status New    Target Date 05/08/21      PT LONG TERM GOAL #3   Title Patient to report improved ability to reach overhead functionally    Time 6    Period Weeks    Status New    Target Date 05/08/21      PT LONG TERM GOAL #4   Title Independent in HEP    Time 6    Period Weeks    Status New    Target Date 05/08/21      PT LONG TERM GOAL #5   Title Improve functional limitation score to 72    Time 6    Period Weeks     Status New    Target Date 05/08/21                   Plan - 04/05/21 0936     Clinical Impression Statement Achieved some contraction of middle and lower trap with exercises in clinic - issues exercises and TB for home. Continued muscular tightness and poor body mechanics.    Rehab Potential Good    PT Frequency 2x / week    PT Duration 6 weeks    PT Treatment/Interventions ADLs/Self Care Home Management;Aquatic Therapy;Cryotherapy;Electrical Stimulation;Iontophoresis 4mg /ml Dexamethasone;Moist Heat;Ultrasound;Therapeutic activities;Therapeutic exercise;Balance training;Neuromuscular re-education;Patient/family education;Manual techniques;Passive range of motion;Dry needling;Taping    PT Next Visit Plan review HEP; progress with pec stretch and posterior shoulder girdle strengthening - consider DN for Lt shoulder girdle    PT Home Exercise Plan [email or web address]    Consulted and Agree with Plan of Care Patient             Patient will benefit from skilled therapeutic intervention in order to improve the following deficits and impairments:     Visit Diagnosis: Muscle weakness (generalized)  Chronic left shoulder pain  Other symptoms and signs involving the musculoskeletal system  Abnormal posture     Problem List Patient Active Problem List   Diagnosis Date Noted   Greater trochanteric pain syndrome of left lower extremity 10/20/2019    Jewelia Bocchino Nilda Simmer, PT, MPH 04/05/2021, 10:18 AM  [facility] [place], [place], [address] Phone: [phone]   Fax:  [phone]  Name: Raymond Walsh MRN: [id number] Date of Birth: 1956/01/24

## 2021-04-10 ENCOUNTER — Encounter: Payer: Self-pay | Admitting: Rehabilitative and Restorative Service Providers"

## 2021-04-10 ENCOUNTER — Other Ambulatory Visit: Payer: Self-pay

## 2021-04-10 ENCOUNTER — Ambulatory Visit (INDEPENDENT_AMBULATORY_CARE_PROVIDER_SITE_OTHER): Payer: Medicare Other | Admitting: Rehabilitative and Restorative Service Providers"

## 2021-04-10 DIAGNOSIS — R29898 Other symptoms and signs involving the musculoskeletal system: Secondary | ICD-10-CM | POA: Diagnosis not present

## 2021-04-10 DIAGNOSIS — R293 Abnormal posture: Secondary | ICD-10-CM

## 2021-04-10 DIAGNOSIS — M25512 Pain in left shoulder: Secondary | ICD-10-CM

## 2021-04-10 DIAGNOSIS — M6281 Muscle weakness (generalized): Secondary | ICD-10-CM | POA: Diagnosis present

## 2021-04-10 DIAGNOSIS — G8929 Other chronic pain: Secondary | ICD-10-CM

## 2021-04-10 NOTE — Patient Instructions (Signed)

## 2021-04-10 NOTE — Therapy (Signed)
[place], [place], [address] Phone: [phone]   Fax:  [phone]  Physical Therapy Treatment  Patient Details  Name: Raymond Walsh MRN: [id number] Date of Birth: 1956-03-12 Referring Provider (PT): Dr [name]   Encounter Date: 04/10/2021   PT End of Session - 04/10/21 0938     Visit Number 3    Number of Visits 12    Date for PT Re-Evaluation 05/08/21    Authorization - Visit Number 3    Progress Note Due on Visit 10    PT Start Time 0936    PT Stop Time 1020    PT Time Calculation (min) 44 min    Activity Tolerance Patient tolerated treatment well             Past Medical History:  Diagnosis Date   A-fib ([place]    Cancer ([place])    prostate    Past Surgical History:  Procedure Laterality Date   BACK SURGERY      There were no vitals filed for this visit.   Subjective Assessment - 04/10/21 0958     Subjective Lt Shoulder is feeling better. Enjoys the TB exercises    Currently in Pain? No/denies    Pain Score 0-No pain    Pain Orientation Left                [facility] PT Assessment - 04/10/21 0001       Assessment   Medical Diagnosis Lt shoulder osteoarthritis    Referring Provider (PT) Dr [name]    Onset Date/Surgical Date 03/01/20    Hand Dominance Right    Next MD Visit 4 weeks    Prior Therapy here for TKA      AROM   Right Shoulder Extension 64 Degrees    Right Shoulder Flexion 138 Degrees    Right Shoulder ABduction 146 Degrees    Right Shoulder Internal Rotation --   Thumb 10/11   Right Shoulder External Rotation 90 Degrees    Left Shoulder Extension 48 Degrees    Left Shoulder Flexion 137 Degrees    Left Shoulder ABduction 144 Degrees    Left Shoulder Internal Rotation --   hand to waist   Left Shoulder External Rotation 75 Degrees      Palpation   Palpation comment muscular tightness through Lt > Rt pecs; upper trap; leveator; teres                            [facility] Adult PT Treatment/Exercise - 04/10/21 0001       Shoulder Exercises: Standing   External Rotation Strengthening;Left;10 reps;Theraband    Theraband Level (Shoulder External Rotation) Level 2 (Red)    External Rotation Limitations reactive holding Lt UE at 90 deg at side      Shoulder Exercises: Stretch   Other Shoulder Stretches doorway stretch 30 sec x 1 reps each position      Manual Therapy   Manual therapy comments skilled palpation to assess response to Dn and manual work    Soft tissue mobilization deep tissue work Scientific laboratory technician; Development worker, international aid; deltiod; triceps; supraspinitus    Myofascial Release anterior chest              Trigger Point Dry Needling - 04/10/21 0001     Consent Given? Yes    Education Handout Provided Yes    Printmaker Performed with  Dry Needling Yes    Other Dry Needling Lt    Upper Trapezius Response Palpable increased muscle length;Twitch reponse elicited    Pectoralis Major Response Palpable increased muscle length;Twitch response elicited    Pectoralis Minor Response Palpable increased muscle length;Twitch response elicited    Supraspinatus Response Palpable increased muscle length;Twitch response elicited    Deltoid Response Palpable increased muscle length;Twitch response elicited    Triceps Response Palpable increased muscle length;Twitch response elicited                   PT Education - 04/10/21 1014     Education Details DN    Person(s) Educated Patient    Methods Explanation;Demonstration;Tactile cues;Verbal cues;Handout    Comprehension Verbalized understanding;Returned demonstration;Verbal cues required;Tactile cues required                 PT Long Term Goals - 03/27/21 1212       PT LONG TERM GOAL #1   Title Improve posture and alignment with patient to demonstrate improve upright posture with posterior shoulder girdle engaged    Time 6    Period  Weeks    Status New    Target Date 05/08/21      PT LONG TERM GOAL #2   Title Increase strength Lt shoulder ER to 5/5 with minimal to no pain    Time 6    Period Weeks    Status New    Target Date 05/08/21      PT LONG TERM GOAL #3   Title Patient to report improved ability to reach overhead functionally    Time 6    Period Weeks    Status New    Target Date 05/08/21      PT LONG TERM GOAL #4   Title Independent in HEP    Time 6    Period Weeks    Status New    Target Date 05/08/21      PT LONG TERM GOAL #5   Title Improve functional limitation score to 72    Time 6    Period Weeks    Status New    Target Date 05/08/21                   Plan - 04/10/21 0959     Clinical Impression Statement Improving AROM Lt shoulder. Continued muscular tightness in the Lt shoulder girdle. Trial of DN and manual work for [employer], upper trap; leveator; deltoid; supraspinitus with e-stim. Good release of muscular tightness noted.    Rehab Potential Good    PT Frequency 2x / week    PT Duration 6 weeks    PT Treatment/Interventions ADLs/Self Care Home Management;Aquatic Therapy;Cryotherapy;Electrical Stimulation;Iontophoresis 4mg /ml Dexamethasone;Moist Heat;Ultrasound;Therapeutic activities;Therapeutic exercise;Balance training;Neuromuscular re-education;Patient/family education;Manual techniques;Passive range of motion;Dry needling;Taping    PT Next Visit Plan review HEP; progress with pec stretch and posterior shoulder girdle strengthening - assess response to DN for Lt shoulder girdle    PT Home Exercise Plan [email or web address]    Consulted and Agree with Plan of Care Patient             Patient will benefit from skilled therapeutic intervention in order to improve the following deficits and impairments:     Visit Diagnosis: Muscle weakness (generalized)  Chronic left shoulder pain  Other symptoms and signs involving the musculoskeletal system  Abnormal  posture     Problem List Patient Active Problem List   Diagnosis Date Noted  Greater trochanteric pain syndrome of left lower extremity 10/20/2019    Savvy Peeters Nilda Simmer, PT, MPH  04/10/2021, 10:16 AM  [facility] [place], [place], [address] Phone: [phone]   Fax:  [phone]  Name: Raymond Walsh MRN: [id number] Date of Birth: [birth year]

## 2021-04-12 ENCOUNTER — Encounter: Payer: Self-pay | Admitting: Rehabilitative and Restorative Service Providers"

## 2021-04-12 ENCOUNTER — Ambulatory Visit (INDEPENDENT_AMBULATORY_CARE_PROVIDER_SITE_OTHER): Payer: Medicare Other | Admitting: Rehabilitative and Restorative Service Providers"

## 2021-04-12 ENCOUNTER — Other Ambulatory Visit: Payer: Self-pay

## 2021-04-12 DIAGNOSIS — R29898 Other symptoms and signs involving the musculoskeletal system: Secondary | ICD-10-CM | POA: Diagnosis not present

## 2021-04-12 DIAGNOSIS — M6281 Muscle weakness (generalized): Secondary | ICD-10-CM

## 2021-04-12 DIAGNOSIS — R293 Abnormal posture: Secondary | ICD-10-CM | POA: Diagnosis not present

## 2021-04-12 DIAGNOSIS — M25512 Pain in left shoulder: Secondary | ICD-10-CM

## 2021-04-12 DIAGNOSIS — G8929 Other chronic pain: Secondary | ICD-10-CM

## 2021-04-12 NOTE — Patient Instructions (Signed)
Access Code: [email or web address] URL: [email or web address] Date: 04/12/2021 Prepared by: Gillermo Murdoch  Exercises Seated Cervical Retraction - 3 x daily - 7 x weekly - 1 sets - 10 reps Standing Scapular Retraction - 3 x daily - 7 x weekly - 1 sets - 10 reps - 10 hold Shoulder External Rotation and Scapular Retraction - 3 x daily - 7 x weekly - 1 sets - 10 reps - hold Doorway Pec Stretch at 60 Degrees Abduction - 3 x daily - 7 x weekly - 1 sets - 3 reps Doorway Pec Stretch at 90 Degrees Abduction - 3 x daily - 7 x weekly - 1 sets - 3 reps - 30 seconds hold Doorway Pec Stretch at 120 Degrees Abduction - 3 x daily - 7 x weekly - 1 sets - 3 reps - 30 second hold hold Supine Chest Stretch on Foam Roll - 2 x daily - 7 x weekly - 1 sets - 1 reps - 2-5 min sec hold Shoulder External Rotation and Scapular Retraction with Resistance - 2 x daily - 7 x weekly - 1 sets - 10 reps - 3-5 sec hold Scapular Retraction with Resistance - 1 x daily - 7 x weekly - 1 sets - 20 reps - 10-20 sec hold Single Arm Row with Trunk Rotation - 1 x daily - 7 x weekly - 2-3 sets - 10 reps - 3 sec hold Shoulder External Rotation Reactive Isometrics - 2 x daily - 7 x weekly - 1 sets - 10 reps - 3 sec hold Shoulder Internal Rotation Reactive Isometrics - 2 x daily - 7 x weekly - 1 sets - 10 reps - 3-5 sec hold Anti-Rotation Lateral Stepping with Press - 2 x daily - 7 x weekly - 1-2 sets - 10 reps - 2-3 sec hold

## 2021-04-12 NOTE — Therapy (Addendum)
[place], [place], [address] Phone: [phone]   Fax:  [phone]  Physical Therapy Treatment  Patient Details  Name: Raymond Walsh MRN: [id number] Date of Birth: 1955/07/30 Referring Provider (PT): Dr [name]   Encounter Date: 04/12/2021   PT End of Session - 04/12/21 0848     Visit Number 4    Number of Visits 12    Date for PT Re-Evaluation 05/08/21    Authorization - Visit Number 4    Progress Note Due on Visit 10    PT Start Time 0848    PT Stop Time 0930    PT Time Calculation (min) 42 min    Activity Tolerance Patient tolerated treatment well             Past Medical History:  Diagnosis Date   A-fib ([place])    Cancer ([place])    prostate    Past Surgical History:  Procedure Laterality Date   BACK SURGERY      There were no vitals filed for this visit.   Subjective Assessment - 04/12/21 0849     Subjective Lt shoulder is feeling better. He has better mobility and does not have as much pain when is rolls over at night.    Currently in Pain? No/denies    Pain Score 0-No pain    Pain Location Shoulder    Pain Orientation Left                               [facility] Adult PT Treatment/Exercise - 04/12/21 0001       Shoulder Exercises: Standing   External Rotation Strengthening;Left;10 reps;Theraband    Theraband Level (Shoulder External Rotation) Level 2 (Red)    External Rotation Limitations reactive holding Lt UE at 90 deg at side    Internal Rotation Strengthening;Left;10 reps;Theraband    Theraband Level (Shoulder Internal Rotation) Level 2 (Red)    Internal Rotation Limitations reactive stepping to side holding Lt UE at 90 deg elbow flexion arm at side    Other Standing Exercises antirotation red TB x 10 each side      Shoulder Exercises: Stretch   Other Shoulder Stretches doorway stretch 30 sec x 1 reps each position      Manual Therapy    Manual therapy comments skilled palpation to assess response to Dn and manual work    Soft tissue mobilization deep tissue work Scientific laboratory technician; Development worker, international aid; deltiod; triceps; supraspinitus    Myofascial Release anterior chest              Trigger Point Dry Needling - 04/12/21 0001     Consent Given? Yes    Education Handout Provided Previously provided    Electrical Stimulation Performed with Dry Needling Yes    Other Dry Needling Lt    Upper Trapezius Response Palpable increased muscle length;Twitch reponse elicited    Supraspinatus Response Palpable increased muscle length;Twitch response elicited    Deltoid Response Palpable increased muscle length;Twitch response elicited    Latissimus dorsi Response Palpable increased muscle length;Twitch response elicited    Teres major Response Palpable increased muscle length;Twitch response elicited    Teres minor Response Palpable increased muscle length;Twitch response elicited                   PT Education - 04/12/21 0915     Education Details HEP  Person(s) Educated Patient    Methods Explanation;Demonstration;Tactile cues;Verbal cues;Handout    Comprehension Verbalized understanding;Returned demonstration;Verbal cues required;Tactile cues required                 PT Long Term Goals - 03/27/21 1212       PT LONG TERM GOAL #1   Title Improve posture and alignment with patient to demonstrate improve upright posture with posterior shoulder girdle engaged    Time 6    Period Weeks    Status New    Target Date 05/08/21      PT LONG TERM GOAL #2   Title Increase strength Lt shoulder ER to 5/5 with minimal to no pain    Time 6    Period Weeks    Status New    Target Date 05/08/21      PT LONG TERM GOAL #3   Title Patient to report improved ability to reach overhead functionally    Time 6    Period Weeks    Status New    Target Date 05/08/21      PT LONG TERM GOAL #4   Title Independent in HEP     Time 6    Period Weeks    Status New    Target Date 05/08/21      PT LONG TERM GOAL #5   Title Improve functional limitation score to 72    Time 6    Period Weeks    Status New    Target Date 05/08/21                   Plan - 04/12/21 0851     Clinical Impression Statement Decreasing pain in the Lt shoulder. Continued work to decrease muscular tightness and improve muscular balance in the posterior shoulder girdle. Working on strengthening and stabilization in the thoracic spine and posterior shoulder girdle.    Rehab Potential Good    PT Frequency 2x / week    PT Duration 6 weeks    PT Treatment/Interventions ADLs/Self Care Home Management;Aquatic Therapy;Cryotherapy;Electrical Stimulation;Iontophoresis 4mg /ml Dexamethasone;Moist Heat;Ultrasound;Therapeutic activities;Therapeutic exercise;Balance training;Neuromuscular re-education;Patient/family education;Manual techniques;Passive range of motion;Dry needling;Taping    PT Next Visit Plan review HEP; progress with pec stretch and posterior shoulder girdle strengthening - continue DN for Lt shoulder girdle    PT Home Exercise Plan [email or web address]    Consulted and Agree with Plan of Care Patient             Patient will benefit from skilled therapeutic intervention in order to improve the following deficits and impairments:     Visit Diagnosis: Muscle weakness (generalized)  Chronic left shoulder pain  Other symptoms and signs involving the musculoskeletal system  Abnormal posture     Problem List Patient Active Problem List   Diagnosis Date Noted   Greater trochanteric pain syndrome of left lower extremity 10/20/2019    Timberly Yott Nilda Simmer, PT, MPH  04/12/2021, 9:24 AM  [facility] [place], [place], [address] Phone: [phone]   Fax:  [phone]  Name: Raymond Walsh MRN: [id number] Date of Birth: 01-08-1956

## 2021-04-18 ENCOUNTER — Encounter: Payer: Self-pay | Admitting: Rehabilitative and Restorative Service Providers"

## 2021-04-18 ENCOUNTER — Ambulatory Visit (INDEPENDENT_AMBULATORY_CARE_PROVIDER_SITE_OTHER): Payer: Medicare Other | Admitting: Rehabilitative and Restorative Service Providers"

## 2021-04-18 ENCOUNTER — Other Ambulatory Visit: Payer: Self-pay

## 2021-04-18 DIAGNOSIS — M25512 Pain in left shoulder: Secondary | ICD-10-CM

## 2021-04-18 DIAGNOSIS — R29898 Other symptoms and signs involving the musculoskeletal system: Secondary | ICD-10-CM | POA: Diagnosis not present

## 2021-04-18 DIAGNOSIS — G8929 Other chronic pain: Secondary | ICD-10-CM

## 2021-04-18 DIAGNOSIS — M6281 Muscle weakness (generalized): Secondary | ICD-10-CM

## 2021-04-18 DIAGNOSIS — R293 Abnormal posture: Secondary | ICD-10-CM | POA: Diagnosis not present

## 2021-04-18 NOTE — Patient Instructions (Signed)
Access Code: [email or web address] URL: [email or web address] Date: 04/18/2021 Prepared by: Gillermo Murdoch  Exercises Seated Cervical Retraction - 3 x daily - 7 x weekly - 1 sets - 10 reps Standing Scapular Retraction - 3 x daily - 7 x weekly - 1 sets - 10 reps - 10 hold Shoulder External Rotation and Scapular Retraction - 3 x daily - 7 x weekly - 1 sets - 10 reps - hold Doorway Pec Stretch at 60 Degrees Abduction - 3 x daily - 7 x weekly - 1 sets - 3 reps Doorway Pec Stretch at 90 Degrees Abduction - 3 x daily - 7 x weekly - 1 sets - 3 reps - 30 seconds hold Doorway Pec Stretch at 120 Degrees Abduction - 3 x daily - 7 x weekly - 1 sets - 3 reps - 30 second hold hold Supine Chest Stretch on Foam Roll - 2 x daily - 7 x weekly - 1 sets - 1 reps - 2-5 min sec hold Shoulder External Rotation and Scapular Retraction with Resistance - 2 x daily - 7 x weekly - 1 sets - 10 reps - 3-5 sec hold Scapular Retraction with Resistance - 1 x daily - 7 x weekly - 1 sets - 20 reps - 10-20 sec hold Single Arm Row with Trunk Rotation - 1 x daily - 7 x weekly - 2-3 sets - 10 reps - 3 sec hold Shoulder External Rotation Reactive Isometrics - 2 x daily - 7 x weekly - 1 sets - 10 reps - 3 sec hold Shoulder Internal Rotation Reactive Isometrics - 2 x daily - 7 x weekly - 1 sets - 10 reps - 3-5 sec hold Anti-Rotation Lateral Stepping with Press - 2 x daily - 7 x weekly - 1-2 sets - 10 reps - 2-3 sec hold Shoulder External Rotation with Resistance - 1 x daily - 7 x weekly - 1 sets - 10 reps - 3 sec hold

## 2021-04-18 NOTE — Therapy (Signed)
[place], [place], [address] Phone: [phone]   Fax:  [phone]  Physical Therapy Treatment  Patient Details  Name: Raymond Walsh MRN: [id number] Date of Birth: [birth year] Referring Provider (PT): Dr [name]   Encounter Date: 04/18/2021   PT End of Session - 04/18/21 0941     Visit Number 5    Number of Visits 12    Date for PT Re-Evaluation 05/08/21    Authorization - Visit Number 5    Progress Note Due on Visit 10    PT Start Time 0930    PT Stop Time 1015    PT Time Calculation (min) 45 min    Activity Tolerance Patient tolerated treatment well             Past Medical History:  Diagnosis Date   A-fib ([place])    Cancer ([place])    prostate    Past Surgical History:  Procedure Laterality Date   BACK SURGERY      There were no vitals filed for this visit.   Subjective Assessment - 04/18/21 0942     Subjective Lt shoulder is doing OK - working on exercises at home.    Currently in Pain? No/denies    Pain Score 0-No pain                [facility] PT Assessment - 04/18/21 0001       Assessment   Medical Diagnosis Lt shoulder osteoarthritis    Referring Provider (PT) Dr [name]    Onset Date/Surgical Date 03/01/20    Hand Dominance Right    Next MD Visit 4 weeks    Prior Therapy here for TKA      Posture/Postural Control   Posture Comments improving postue and alignment with less head forward; shoulders rounded and elevated; head of the humerus anterior in orientation; increased thoracic kyphosis      Palpation   Palpation comment muscular tightness through Lt > Rt pecs; upper trap; leveator; teres                           [facility] Adult PT Treatment/Exercise - 04/18/21 0001       Shoulder Exercises: Standing   External Rotation Strengthening;Left;10 reps;Theraband    Theraband Level (Shoulder External Rotation) Level 3 (Green)     External Rotation Limitations reactive holding Lt UE at 90 deg at side    Internal Rotation Strengthening;Left;10 reps;Theraband    Theraband Level (Shoulder Internal Rotation) Level 3 (Green)    Internal Rotation Limitations reactive stepping to side holding Lt UE at 90 deg elbow flexion arm at side    Row Strengthening;Both;20 reps;Theraband    Theraband Level (Shoulder Row) Level 4 (Blue)    Row Limitations bow and arrow x 10 each side; reactive step back focus on pulling shoudlers down and back engaging posterior shoulder girdle musculature    Retraction Strengthening;Both;20 reps;Theraband    Theraband Level (Shoulder Retraction) Level 2 (Red)    Other Standing Exercises triceps kick back 5# 10 reps x 2 sets VC to engage posterior shoudler girdle    Other Standing Exercises antirotation green TB x 10 each side      Shoulder Exercises: ROM/Strengthening   Other ROM/Strengthening Exercises body blade 60 sec x 2 reps Lt, 1 Rt      Shoulder Exercises: Stretch   Other Shoulder Stretches doorway stretch 30 sec x 1  reps each position      Manual Therapy   Manual therapy comments skilled palpation to assess response to Dn and manual work    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 Yes    Education Handout Provided Previously provided    Other Dry Needling Lt    Latissimus dorsi Response Palpable increased muscle length;Twitch response elicited    Teres major Response Palpable increased muscle length;Twitch response elicited    Teres minor Response Palpable increased muscle length;Twitch response elicited    Triceps Response Palpable increased muscle length;Twitch response elicited                   PT Education - 04/18/21 1017     Education Details HEP    Person(s) Educated Patient    Methods  Explanation;Demonstration;Tactile cues;Verbal cues;Handout    Comprehension Verbalized understanding;Returned demonstration;Verbal cues required;Tactile cues required                 PT Long Term Goals - 03/27/21 1212       PT LONG TERM GOAL #1   Title Improve posture and alignment with patient to demonstrate improve upright posture with posterior shoulder girdle engaged    Time 6    Period Weeks    Status New    Target Date 05/08/21      PT LONG TERM GOAL #2   Title Increase strength Lt shoulder ER to 5/5 with minimal to no pain    Time 6    Period Weeks    Status New    Target Date 05/08/21      PT LONG TERM GOAL #3   Title Patient to report improved ability to reach overhead functionally    Time 6    Period Weeks    Status New    Target Date 05/08/21      PT LONG TERM GOAL #4   Title Independent in HEP    Time 6    Period Weeks    Status New    Target Date 05/08/21      PT LONG TERM GOAL #5   Title Improve functional limitation score to 72    Time 6    Period Weeks    Status New    Target Date 05/08/21                   Plan - 04/18/21 0944     Clinical Impression Statement Continued progress with resistive exercises. Patient has decreased palpable tightness through the Lt shoulder girdle and is progressing well with strengthening and stabilization through the Lt posterior shoulder girdle and shoulder. Good response to DN, manual work and strengthening. Progressing well toward stated goals of therapy.    Rehab Potential Good    PT Frequency 2x / week    PT Duration 6 weeks    PT Treatment/Interventions ADLs/Self Care Home Management;Aquatic Therapy;Cryotherapy;Electrical Stimulation;Iontophoresis 4mg /ml Dexamethasone;Moist Heat;Ultrasound;Therapeutic activities;Therapeutic exercise;Balance training;Neuromuscular re-education;Patient/family education;Manual techniques;Passive range of motion;Dry needling;Taping    PT Next Visit Plan review HEP;  progress with pec stretch and posterior shoulder girdle strengthening - continue DN for Lt shoulder girdle    PT Home Exercise Plan [email or web address]    Consulted and Agree with Plan of Care Patient  Patient will benefit from skilled therapeutic intervention in order to improve the following deficits and impairments:     Visit Diagnosis: Muscle weakness (generalized)  Chronic left shoulder pain  Other symptoms and signs involving the musculoskeletal system  Abnormal posture     Problem List Patient Active Problem List   Diagnosis Date Noted   Greater trochanteric pain syndrome of left lower extremity 10/20/2019    Liliana Brentlinger Nilda Simmer, PT, MPH 04/18/2021, 10:23 AM  [facility] [place], [place], [address] Phone: [phone]   Fax:  [phone]  Name: Raymond Walsh MRN: [id number] Date of Birth: 1955/11/07

## 2021-04-19 ENCOUNTER — Ambulatory Visit (INDEPENDENT_AMBULATORY_CARE_PROVIDER_SITE_OTHER): Payer: Medicare Other | Admitting: Rehabilitative and Restorative Service Providers"

## 2021-04-19 ENCOUNTER — Encounter: Payer: Self-pay | Admitting: Rehabilitative and Restorative Service Providers"

## 2021-04-19 DIAGNOSIS — R293 Abnormal posture: Secondary | ICD-10-CM | POA: Diagnosis not present

## 2021-04-19 DIAGNOSIS — M6281 Muscle weakness (generalized): Secondary | ICD-10-CM | POA: Diagnosis present

## 2021-04-19 DIAGNOSIS — R29898 Other symptoms and signs involving the musculoskeletal system: Secondary | ICD-10-CM | POA: Diagnosis not present

## 2021-04-19 DIAGNOSIS — G8929 Other chronic pain: Secondary | ICD-10-CM

## 2021-04-19 DIAGNOSIS — M25512 Pain in left shoulder: Secondary | ICD-10-CM

## 2021-04-19 NOTE — Patient Instructions (Signed)
Access Code: [email or web address] URL: [email or web address] Date: 04/19/2021 Prepared by: Gillermo Murdoch  Exercises Seated Cervical Retraction - 3 x daily - 7 x weekly - 1 sets - 10 reps Standing Scapular Retraction - 3 x daily - 7 x weekly - 1 sets - 10 reps - 10 hold Shoulder External Rotation and Scapular Retraction - 3 x daily - 7 x weekly - 1 sets - 10 reps - hold Doorway Pec Stretch at 60 Degrees Abduction - 3 x daily - 7 x weekly - 1 sets - 3 reps Doorway Pec Stretch at 90 Degrees Abduction - 3 x daily - 7 x weekly - 1 sets - 3 reps - 30 seconds hold Doorway Pec Stretch at 120 Degrees Abduction - 3 x daily - 7 x weekly - 1 sets - 3 reps - 30 second hold hold Supine Chest Stretch on Foam Roll - 2 x daily - 7 x weekly - 1 sets - 1 reps - 2-5 min sec hold Shoulder External Rotation and Scapular Retraction with Resistance - 2 x daily - 7 x weekly - 1 sets - 10 reps - 3-5 sec hold Scapular Retraction with Resistance - 1 x daily - 7 x weekly - 1 sets - 20 reps - 10-20 sec hold Shoulder External Rotation Reactive Isometrics - 2 x daily - 7 x weekly - 1 sets - 10 reps - 3 sec hold Shoulder Internal Rotation Reactive Isometrics - 2 x daily - 7 x weekly - 1 sets - 10 reps - 3-5 sec hold Anti-Rotation Lateral Stepping with Press - 2 x daily - 7 x weekly - 1-2 sets - 10 reps - 2-3 sec hold Drawing Bow - 1 x daily - 7 x weekly - 1 sets - 10 reps - 3 sec hold Anti-Rotation Lateral Stepping with Press - 2 x daily - 7 x weekly - 1-2 sets - 10 reps - 2-3 sec hold Shoulder External Rotation with Resistance - 2 x daily - 7 x weekly - 1 sets - 3 reps - 30 sec hold

## 2021-04-19 NOTE — Therapy (Signed)
[place], [place], [address] Phone: [phone]   Fax:  [phone]  Physical Therapy Treatment  Patient Details  Name: Raymond Walsh MRN: [id number] Date of Birth: 08/21/1955 Referring Provider (PT): Dr [name]   Encounter Date: 04/19/2021   PT End of Session - 04/19/21 1450     Visit Number 6    Number of Visits 12    Date for PT Re-Evaluation 05/08/21    Authorization - Visit Number 6    Progress Note Due on Visit 10    PT Start Time 1448    PT Stop Time 1530    PT Time Calculation (min) 42 min    Activity Tolerance Patient tolerated treatment well             Past Medical History:  Diagnosis Date   A-fib ([place])    Cancer ([place])    prostate    Past Surgical History:  Procedure Laterality Date   BACK SURGERY      There were no vitals filed for this visit.   Subjective Assessment - 04/19/21 1452     Subjective Doing well - no pain in the Lt shoulder. No soreness in the shoulder from DN.    Currently in Pain? No/denies    Pain Score 0-No pain    Pain Location Shoulder    Pain Orientation Left                               [facility] Adult PT Treatment/Exercise - 04/19/21 0001       Shoulder Exercises: Standing   Horizontal ABduction Strengthening;Both;20 reps;Theraband    Theraband Level (Shoulder Horizontal ABduction) Level 2 (Red)    Horizontal ABduction Limitations ER motion    External Rotation Strengthening;Left;10 reps;Theraband    Theraband Level (Shoulder External Rotation) Level 3 (Green)    External Rotation Limitations reactive holding Lt UE at 90 deg at side    Internal Rotation Strengthening;Left;10 reps;Theraband    Theraband Level (Shoulder Internal Rotation) Level 3 (Green)    Internal Rotation Limitations reactive stepping to side holding Lt UE at 90 deg elbow flexion arm at side    Row Strengthening;Both;20 reps;Theraband     Theraband Level (Shoulder Row) Level 4 (Blue)    Row Limitations bow and arrow x 10 each side; reactive step back focus on pulling shoulders down and back engaging posterior shoulder girdle musculature    Retraction Strengthening;Both;20 reps;Theraband    Theraband Level (Shoulder Retraction) Level 2 (Red)    Other Standing Exercises antirotation green TB x 10 each side; added shoulder flexion with arms fwd X 10 each UE      Shoulder Exercises: ROM/Strengthening   UBE (Upper Arm Bike) L3 x 4 min alternating frw/back      Shoulder Exercises: Stretch   Wall Stretch - Flexion 2 reps;20 seconds    Wall Stretch - Flexion Limitations hands on door facing overhead pt stepping through door to feel stretch    Other Shoulder Stretches doorway stretch 30 sec x 1 reps each position    Other Shoulder Stretches supine T on coregeous ball ~ 1 min; thoracic extension sitting w/ coregeous ball T spine 20-30 sec x 2 reps; trunk rotation w/ coregeous ball T-spine 20-30 sec x 2 reps each side                     PT  Education - 04/19/21 1529     Education Details HEP    Person(s) Educated Patient    Methods Explanation;Demonstration;Tactile cues;Verbal cues;Handout    Comprehension Verbalized understanding;Returned demonstration;Verbal cues required;Tactile cues required                 PT Long Term Goals - 03/27/21 1212       PT LONG TERM GOAL #1   Title Improve posture and alignment with patient to demonstrate improve upright posture with posterior shoulder girdle engaged    Time 6    Period Weeks    Status New    Target Date 05/08/21      PT LONG TERM GOAL #2   Title Increase strength Lt shoulder ER to 5/5 with minimal to no pain    Time 6    Period Weeks    Status New    Target Date 05/08/21      PT LONG TERM GOAL #3   Title Patient to report improved ability to reach overhead functionally    Time 6    Period Weeks    Status New    Target Date 05/08/21      PT LONG  TERM GOAL #4   Title Independent in HEP    Time 6    Period Weeks    Status New    Target Date 05/08/21      PT LONG TERM GOAL #5   Title Improve functional limitation score to 72    Time 6    Period Weeks    Status New    Target Date 05/08/21                   Plan - 04/19/21 1453     Clinical Impression Statement Patient reports continued progress with Lt shoulder; no soreness from DN; compliance with HEP. He demonstrates progress with strengthening and stabilization in the Lt shoulder. Patient will break up exercises so he is not doing every exercise from PT every day.    Rehab Potential Good    PT Frequency 2x / week    PT Duration 6 weeks    PT Treatment/Interventions ADLs/Self Care Home Management;Aquatic Therapy;Cryotherapy;Electrical Stimulation;Iontophoresis 4mg /ml Dexamethasone;Moist Heat;Ultrasound;Therapeutic activities;Therapeutic exercise;Balance training;Neuromuscular re-education;Patient/family education;Manual techniques;Passive range of motion;Dry needling;Taping    PT Next Visit Plan review HEP; progress with pec stretch and posterior shoulder girdle strengthening - continue DN for Lt shoulder girdle    PT Home Exercise Plan [email or web address]    Consulted and Agree with Plan of Care Patient             Patient will benefit from skilled therapeutic intervention in order to improve the following deficits and impairments:     Visit Diagnosis: Muscle weakness (generalized)  Chronic left shoulder pain  Other symptoms and signs involving the musculoskeletal system  Abnormal posture     Problem List Patient Active Problem List   Diagnosis Date Noted   Greater trochanteric pain syndrome of left lower extremity 10/20/2019    Callie Facey Nilda Simmer, PT, MPH  04/19/2021, 3:35 PM  [facility] [place], [place], [address] Phone: [phone]   Fax:  [phone]  Name: Raymond Walsh MRN:  [id number] Date of Birth: [birth year]

## 2021-04-20 ENCOUNTER — Encounter: Payer: Medicare Other | Admitting: Physical Therapy

## 2021-04-24 ENCOUNTER — Other Ambulatory Visit: Payer: Self-pay

## 2021-04-24 ENCOUNTER — Ambulatory Visit (INDEPENDENT_AMBULATORY_CARE_PROVIDER_SITE_OTHER): Payer: Medicare Other | Admitting: Rehabilitative and Restorative Service Providers"

## 2021-04-24 ENCOUNTER — Encounter: Payer: Self-pay | Admitting: Rehabilitative and Restorative Service Providers"

## 2021-04-24 DIAGNOSIS — R29898 Other symptoms and signs involving the musculoskeletal system: Secondary | ICD-10-CM

## 2021-04-24 DIAGNOSIS — M6281 Muscle weakness (generalized): Secondary | ICD-10-CM | POA: Diagnosis present

## 2021-04-24 DIAGNOSIS — R293 Abnormal posture: Secondary | ICD-10-CM

## 2021-04-24 DIAGNOSIS — M25512 Pain in left shoulder: Secondary | ICD-10-CM | POA: Diagnosis not present

## 2021-04-24 DIAGNOSIS — G8929 Other chronic pain: Secondary | ICD-10-CM

## 2021-04-24 NOTE — Therapy (Addendum)
[place], [place], [address] Phone: [phone]   Fax:  [phone]  Physical Therapy Treatment  Patient Details  Name: Raymond Walsh MRN: [id number] Date of Birth: [birth year] Referring Provider (PT): Dr [name]  PHYSICAL THERAPY DISCHARGE SUMMARY  Visits from Start of Care: 7  Current functional level related to goals / functional outcomes: See progress note for discharge status   Remaining deficits: Unknown    Education / Equipment: HEP  Patient agrees to discharge. Patient goals were partially met. Patient is being discharged due to not returning since the last visit.  [name] P. Helene Kelp PT, MPH 05/29/21 10:53 AM   Encounter Date: 04/24/2021   PT End of Session - 04/24/21 0938     Visit Number 7    Number of Visits 12    Date for PT Re-Evaluation 05/08/21    Authorization - Visit Number 7    Progress Note Due on Visit 10    PT Start Time 0934    PT Stop Time 1022    PT Time Calculation (min) 48 min    Activity Tolerance Patient tolerated treatment well             Past Medical History:  Diagnosis Date   A-fib ([place])    Cancer ([place])    prostate    Past Surgical History:  Procedure Laterality Date   BACK SURGERY      There were no vitals filed for this visit.   Subjective Assessment - 04/24/21 0939     Subjective Some pain in the Lt shoulder today on an intermittent basis just this morning. Not sure what he may hav done differently. Has a routine of exercise.    Currently in Pain? Yes    Pain Score 3     Pain Location Shoulder    Pain Orientation Left    Pain Descriptors / Indicators Nagging    Pain Type Chronic pain    Pain Onset More than a month ago    Pain Frequency Intermittent                [facility] PT Assessment - 04/24/21 0001       Assessment   Medical Diagnosis Lt shoulder osteoarthritis    Referring Provider (PT) Dr [name]     Onset Date/Surgical Date 03/01/20    Hand Dominance Right    Next MD Visit 4 weeks    Prior Therapy here for TKA      Palpation   Palpation comment muscular tightness through Lt > Rt pecs; upper trap; leveator; teres                           [facility] Adult PT Treatment/Exercise - 04/24/21 0001       Shoulder Exercises: ROM/Strengthening   UBE (Upper Arm Bike) L6 x 4 min alternating frw/back      Shoulder Exercises: Stretch   Wall Stretch - Flexion 2 reps;20 seconds    Wall Stretch - Flexion Limitations hands on door facing overhead pt stepping through door to feel stretch    Other Shoulder Stretches doorway stretch 30 sec x 1 reps each position    Other Shoulder Stretches supine T on coregeous ball ~ 1 min; thoracic extension sitting w/ coregeous ball T spine 20-30 sec x 2 reps; trunk rotation w/ coregeous ball T-spine 20-30 sec x 2 reps each side  Manual Therapy   Manual therapy comments skilled palpation to assess response to Dn and manual work    Soft tissue mobilization deep tissue work Lt upper trap; Furniture conservator/restorer; deltiod; triceps; supraspinitus              Trigger Point Dry Needling - 04/24/21 0001     Consent Given? Yes    Education Handout Provided Previously provided    Electrical Stimulation Performed with Dry Needling Yes    Other Dry Needling Lt    Upper Trapezius Response Palpable increased muscle length;Twitch reponse elicited    Deltoid Response Palpable increased muscle length;Twitch response elicited    Biceps Response Palpable increased muscle length;Twitch response elicited                        PT Long Term Goals - 03/27/21 1212       PT LONG TERM GOAL #1   Title Improve posture and alignment with patient to demonstrate improve upright posture with posterior shoulder girdle engaged    Time 6    Period Weeks    Status New    Target Date 05/08/21      PT LONG TERM GOAL #2   Title Increase strength Lt shoulder  ER to 5/5 with minimal to no pain    Time 6    Period Weeks    Status New    Target Date 05/08/21      PT LONG TERM GOAL #3   Title Patient to report improved ability to reach overhead functionally    Time 6    Period Weeks    Status New    Target Date 05/08/21      PT LONG TERM GOAL #4   Title Independent in HEP    Time 6    Period Weeks    Status New    Target Date 05/08/21      PT LONG TERM GOAL #5   Title Improve functional limitation score to 72    Time 6    Period Weeks    Status New    Target Date 05/08/21                   Plan - 04/24/21 1010     Clinical Impression Statement Patient reports some intermittent pain in the Lt shoudler this am. Not sure of anything he did that may have increased symptoms. Patient has muscular tightness in upper trap, leveator, anterior deltiod, biceps. Good response to DN and manual work. Progressing gradually toward stated goals of therapy.    Rehab Potential Good    PT Frequency 2x / week    PT Duration 6 weeks    PT Treatment/Interventions ADLs/Self Care Home Management;Aquatic Therapy;Cryotherapy;Electrical Stimulation;Iontophoresis 16m/ml Dexamethasone;Moist Heat;Ultrasound;Therapeutic activities;Therapeutic exercise;Balance training;Neuromuscular re-education;Patient/family education;Manual techniques;Passive range of motion;Dry needling;Taping    PT Next Visit Plan review HEP; progress with pec stretch and posterior shoulder girdle strengthening - continue DN for Lt shoulder girdle    PT Home Exercise Plan [email or web address]    Consulted and Agree with Plan of Care Patient             Patient will benefit from skilled therapeutic intervention in order to improve the following deficits and impairments:     Visit Diagnosis: Muscle weakness (generalized)  Chronic left shoulder pain  Other symptoms and signs involving the musculoskeletal system  Abnormal posture     Problem List Patient Active Problem List  Diagnosis Date Noted   Greater trochanteric pain syndrome of left lower extremity 10/20/2019    [place] Nilda Simmer, PT, MPH 04/24/2021, 10:21 AM  [facility] [place], [place], [address] Phone: [phone]   Fax:  [phone]  Name: Raymond Walsh MRN: [id number] Date of Birth: 06-Feb-1956

## 2021-04-27 ENCOUNTER — Encounter: Payer: Medicare Other | Admitting: Rehabilitative and Restorative Service Providers"

## 2021-05-01 ENCOUNTER — Encounter: Payer: Medicare Other | Admitting: Rehabilitative and Restorative Service Providers"

## 2021-05-03 ENCOUNTER — Encounter: Payer: Medicare Other | Admitting: Rehabilitative and Restorative Service Providers"

## 2021-05-08 ENCOUNTER — Telehealth: Payer: Self-pay | Admitting: Rehabilitative and Restorative Service Providers"

## 2021-05-08 ENCOUNTER — Encounter: Payer: Medicare Other | Admitting: Rehabilitative and Restorative Service Providers"

## 2021-05-08 NOTE — Telephone Encounter (Signed)
TC/left message for patient. Raymond Walsh failed to show for scheduled appointment. Ask that he call to confirm appointment for Thursday, 05/10/21. [place] P. Helene Kelp PT, MPH 05/08/21 1:06 PM

## 2021-05-10 ENCOUNTER — Encounter: Payer: Medicare Other | Admitting: Rehabilitative and Restorative Service Providers"

## 2021-06-15 ENCOUNTER — Other Ambulatory Visit: Payer: Self-pay | Admitting: Orthopedic Surgery

## 2021-06-15 DIAGNOSIS — M25512 Pain in left shoulder: Secondary | ICD-10-CM

## 2021-07-01 HISTORY — PX: ATRIAL FIBRILLATION ABLATION: SHX5732

## 2021-07-01 HISTORY — PX: KNEE ARTHROPLASTY: SHX992

## 2021-07-12 NOTE — Progress Notes (Signed)
Covid test on 07/20/2021 at 0915 am Come thru main entrance at [facility].  Have a seat in the lobby to the right .  Call [phone] and give them your name and let them know you are here for covid testing.               Your procedure is scheduled on:    07/24/2021  Report to [facility] Main  Entrance   Report to admitting at    [phone]     Call this number if you have problems the morning of surgery [phone]    REMEMBER: NO  SOLID FOOD CANDY OR GUM AFTER MIDNIGHT. CLEAR LIQUIDS UNTIL    0415am       . NOTHING BY MOUTH EXCEPT CLEAR LIQUIDS UNTIL  0415am   . PLEASE FINISH ENSURE DRINK PER SURGEON ORDER  WHICH NEEDS TO BE COMPLETED AT      .  0415am     CLEAR LIQUID DIET   Foods Allowed                                                                    Coffee and tea, regular and decaf                            Fruit ices (not with fruit pulp)                                      Iced Popsicles                                    Carbonated beverages, regular and diet                                    Cranberry, grape and apple juices Sports drinks like Gatorade Lightly seasoned clear broth or consume(fat free) Sugar, honey syrup ___________________________________________________________________      BRUSH YOUR TEETH MORNING OF SURGERY AND RINSE YOUR MOUTH OUT, NO CHEWING GUM CANDY OR MINTS.     Take these medicines the morning of surgery with A SIP OF WATER:  none   DO NOT TAKE ANY DIABETIC MEDICATIONS DAY OF YOUR SURGERY                               You may not have any metal on your body including hair pins and              piercings  Do not wear jewelry, make-up, lotions, powders or perfumes, deodorant             Do not wear nail polish on your fingernails.  Do not shave  48 hours prior to surgery.              Men may shave face and neck.   Do not bring valuables to the hospital. [facility] IS NOT  RESPONSIBLE   FOR  VALUABLES.  Contacts, dentures or bridgework may not be worn into surgery.  Leave suitcase in the car. After surgery it may be brought to your room.     Patients discharged the day of surgery will not be allowed to drive home. IF YOU ARE HAVING SURGERY AND GOING HOME THE SAME DAY, YOU MUST HAVE AN ADULT TO DRIVE YOU HOME AND BE WITH YOU FOR 24 HOURS. YOU MAY GO HOME BY TAXI OR UBER OR ORTHERWISE, BUT AN ADULT MUST ACCOMPANY YOU HOME AND STAY WITH YOU FOR 24 HOURS.  Name and phone number of your driver:  Special Instructions: N/A              Please read over the following fact sheets you were given: _____________________________________________________________________  [facility] - Preparing for Surgery Before surgery, you can play an important role.  Because skin is not sterile, your skin needs to be as free of germs as possible.  You can reduce the number of germs on your skin by washing with CHG (chlorahexidine gluconate) soap before surgery.  CHG is an antiseptic cleaner which kills germs and bonds with the skin to continue killing germs even after washing. Please DO NOT use if you have an allergy to CHG or antibacterial soaps.  If your skin becomes reddened/irritated stop using the CHG and inform your nurse when you arrive at Short Stay. Do not shave (including legs and underarms) for at least 48 hours prior to the first CHG shower.  You may shave your face/neck. Please follow these instructions carefully:  1.  Shower with CHG Soap the night before surgery and the  morning of Surgery.  2.  If you choose to wash your hair, wash your hair first as usual with your  normal  shampoo.  3.  After you shampoo, rinse your hair and body thoroughly to remove the  shampoo.                           4.  Use CHG as you would any other liquid soap.  You can apply chg directly  to the skin and wash                       Gently with a scrungie or clean washcloth.  5.  Apply the CHG Soap to your body ONLY  FROM THE NECK DOWN.   Do not use on face/ open                           Wound or open sores. Avoid contact with eyes, ears mouth and genitals (private parts).                       Wash face,  Genitals (private parts) with your normal soap.             6.  Wash thoroughly, paying special attention to the area where your surgery  will be performed.  7.  Thoroughly rinse your body with warm water from the neck down.  8.  DO NOT shower/wash with your normal soap after using and rinsing off  the CHG Soap.                9.  Pat yourself dry with a clean towel.            10.  Wear clean pajamas.            11.  Place clean sheets on your bed the night of your first shower and do not  sleep with pets. Day of Surgery : Do not apply any lotions/deodorants the morning of surgery.  Please wear clean clothes to the hospital/surgery center.  FAILURE TO FOLLOW THESE INSTRUCTIONS MAY RESULT IN THE CANCELLATION OF YOUR SURGERY PATIENT SIGNATURE_________________________________  NURSE SIGNATURE__________________________________  ________________________________________________________________________

## 2021-07-12 NOTE — Progress Notes (Signed)
Anesthesia Review:  PCP: Jennet Maduro 05/04/21- [place]  Cardiologist : DR Loni Beckwith- LOV 06/22/21  Chest x-ray : EKG : Echo : Stress test: Cardiac Cath :  Activity level:  Sleep Study/ CPAP : Fasting Blood Sugar :      / Checks Blood Sugar -- times a day:   Blood Thinner/ Instructions /Last Dose: ASA / Instructions/ Last Dose :

## 2021-07-16 ENCOUNTER — Encounter (HOSPITAL_COMMUNITY): Payer: Self-pay

## 2021-07-16 ENCOUNTER — Encounter (HOSPITAL_COMMUNITY)
Admission: RE | Admit: 2021-07-16 | Discharge: 2021-07-16 | Disposition: A | Discharge status: Home / Self Care | Payer: 59 | Source: Ambulatory Visit | Attending: Infectious Diseases | Admitting: Infectious Diseases

## 2021-07-16 DIAGNOSIS — Z01818 Encounter for other preprocedural examination: Secondary | ICD-10-CM

## 2021-07-20 ENCOUNTER — Other Ambulatory Visit: Payer: Medicare Other

## 2021-07-20 ENCOUNTER — Encounter (HOSPITAL_COMMUNITY): Payer: 59

## 2021-07-24 ENCOUNTER — Other Ambulatory Visit: Payer: Self-pay | Admitting: Orthopedic Surgery

## 2021-07-24 ENCOUNTER — Ambulatory Visit: Admit: 2021-07-24 | Payer: 59 | Admitting: Orthopedic Surgery

## 2021-07-24 SURGERY — ARTHROPLASTY, SHOULDER, TOTAL
Anesthesia: Choice | Site: Shoulder | Laterality: Left

## 2021-08-01 ENCOUNTER — Ambulatory Visit
Admission: RE | Admit: 2021-08-01 | Discharge: 2021-08-01 | Disposition: A | Discharge status: Home / Self Care | Payer: Medicare Other | Source: Ambulatory Visit | Attending: Orthopedic Surgery | Admitting: Orthopedic Surgery

## 2021-08-01 DIAGNOSIS — M25512 Pain in left shoulder: Secondary | ICD-10-CM

## 2021-08-01 NOTE — Patient Instructions (Addendum)
DUE TO COVID-19 ONLY ONE VISITOR IS ALLOWED TO COME WITH YOU AND STAY IN THE WAITING ROOM ONLY DURING PRE OP AND PROCEDURE DAY OF SURGERY IF YOU ARE GOING HOME AFTER SURGERY. IF YOU ARE SPENDING THE NIGHT 2 PEOPLE MAY VISIT WITH YOU IN YOUR PRIVATE ROOM AFTER SURGERY UNTIL VISITING  HOURS ARE OVER AT 800 PM AND 1  VISITOR  MAY  SPEND THE NIGHT.   YOU NEED TO HAVE A COVID 19 TEST ON__2/10/23 at 9:00_ THIS TEST MUST BE DONE BEFORE SURGERY,                 Raymond Walsh     Your procedure is scheduled on: 08/14/21   Report to [facility] Main  Entrance   Report to short stay at 5:15 AM     Call this number if you have problems the morning of surgery [phone]    No food after midnight.    You may have clear liquid until 4:30 AM.    At 4:15 AM drink pre surgery drink.   Nothing by mouth after 4:30 AM.   CLEAR LIQUID DIET   Foods Allowed                                                                     Foods Excluded  Coffee and tea, regular and decaf                             liquids that you cannot  Plain Jell-O any favor except red or purple                                           see through such as: Fruit ices (not with fruit pulp)                                     milk, soups, orange juice  Iced Popsicles                                    All solid food Carbonated beverages, regular and diet                                    Cranberry, grape and apple juices Sports drinks like Gatorade Lightly seasoned clear broth or consume(fat free) Sugar    BRUSH YOUR TEETH MORNING OF SURGERY AND RINSE YOUR MOUTH OUT, NO CHEWING GUM CANDY OR MINTS.     Take these medicines the morning of surgery with A SIP OF WATER: Tamsulosin, Flonase if needed                                You may not have any metal on your body including  piercings  Do not wear jewelry, lotions, powders or  deodorant              Men may shave face and neck.   Do not bring  valuables to the hospital. [place].  Contacts, dentures or bridgework may not be worn into surgery.  Leave suitcase in the car. After surgery it may be brought to your room.     [facility]- Preparing for Total Shoulder Arthroplasty    Before surgery, you can play an important role. Because skin is not sterile, your skin needs to be as free of germs as possible. You can reduce the number of germs on your skin by using the following products. Benzoyl Peroxide Gel Reduces the number of germs present on the skin Applied twice a day to shoulder area starting two days before surgery    ==================================================================  Please follow these instructions carefully:  BENZOYL PEROXIDE 5% GEL  Please do not use if you have an allergy to benzoyl peroxide.   If your skin becomes reddened/irritated stop using the benzoyl peroxide.  Starting two days before surgery, apply as follows: Apply benzoyl peroxide in the morning and at night. Apply after taking a shower. If you are not taking a shower clean entire shoulder front, back, and side along with the armpit with a clean wet washcloth.  Place a quarter-sized dollop on your shoulder and rub in thoroughly, making sure to cover the front, back, and side of your shoulder, along with the armpit.   2 days before ____ AM   ____ PM              1 day before ____ AM   ____ PM                         Do this twice a day for two days.  (Last application is the night before surgery, AFTER using the CHG soap as described below).  Do NOT apply benzoyl peroxide gel on the day of surgery.             [facility] - Preparing for Surgery Before surgery, you can play an important role.  Because skin is not sterile, your skin needs to be as free of germs as possible.  You can reduce the number of germs on your skin by washing with CHG (chlorahexidine gluconate) soap before surgery.   CHG is an antiseptic cleaner which kills germs and bonds with the skin to continue killing germs even after washing. Please DO NOT use if you have an allergy to CHG or antibacterial soaps.  If your skin becomes reddened/irritated stop using the CHG and inform your nurse when you arrive at Short Stay.   You may shave your face/neck. Please follow these instructions carefully:  1.  Shower with CHG Soap the night before surgery and the  morning of Surgery.  2.  If you choose to wash your hair, wash your hair first as usual with your  normal  shampoo.  3.  After you shampoo, rinse your hair and body thoroughly to remove the  shampoo.                            4.  Use CHG as you would any other liquid soap.  You can apply  chg directly  to the skin and wash                       Gently with a scrungie or clean washcloth.  5.  Apply the CHG Soap to your body ONLY FROM THE NECK DOWN.   Do not use on face/ open                           Wound or open sores. Avoid contact with eyes, ears mouth and genitals (private parts).                       Wash face,  Genitals (private parts) with your normal soap.             6.  Wash thoroughly, paying special attention to the area where your surgery  will be performed.  7.  Thoroughly rinse your body with warm water from the neck down.  8.  DO NOT shower/wash with your normal soap after using and rinsing off  the CHG Soap.                9.  Pat yourself dry with a clean towel.            10.  Wear clean pajamas.            11.  Place clean sheets on your bed the night of your first shower and do not  sleep with pets. Day of Surgery : Do not apply any lotions/deodorants the morning of surgery.  Please wear clean clothes to the hospital/surgery center.  FAILURE TO FOLLOW THESE INSTRUCTIONS MAY RESULT IN THE CANCELLATION OF YOUR SURGERY PATIENT SIGNATURE_________________________________  NURSE  SIGNATURE__________________________________  ________________________________________________________________________   Adam Phenix  An incentive spirometer is a tool that can help keep your lungs clear and active. This tool measures how well you are filling your lungs with each breath. Taking long deep breaths may help reverse or decrease the chance of developing breathing (pulmonary) problems (especially infection) following: A long period of time when you are unable to move or be active. BEFORE THE PROCEDURE  If the spirometer includes an indicator to show your best effort, your nurse or respiratory therapist will set it to a desired goal. If possible, sit up straight or lean slightly forward. Try not to slouch. Hold the incentive spirometer in an upright position. INSTRUCTIONS FOR USE  Sit on the edge of your bed if possible, or sit up as far as you can in bed or on a chair. Hold the incentive spirometer in an upright position. Breathe out normally. Place the mouthpiece in your mouth and seal your lips tightly around it. Breathe in slowly and as deeply as possible, raising the piston or the ball toward the top of the column. Hold your breath for 3-5 seconds or for as long as possible. Allow the piston or ball to fall to the bottom of the column. Remove the mouthpiece from your mouth and breathe out normally. Rest for a few seconds and repeat Steps 1 through 7 at least 10 times every 1-2 hours when you are awake. Take your time and take a few normal breaths between deep breaths. The spirometer may include an indicator to show your best effort. Use the indicator as a goal to work toward during each repetition. After each set of 10 deep breaths, practice coughing to be sure your  lungs are clear. If you have an incision (the cut made at the time of surgery), support your incision when coughing by placing a pillow or rolled up towels firmly against it. Once you are able to get out of  bed, walk around indoors and cough well. You may stop using the incentive spirometer when instructed by your caregiver.  RISKS AND COMPLICATIONS Take your time so you do not get dizzy or light-headed. If you are in pain, you may need to take or ask for pain medication before doing incentive spirometry. It is harder to take a deep breath if you are having pain. AFTER USE Rest and breathe slowly and easily. It can be helpful to keep track of a log of your progress. Your caregiver can provide you with a simple table to help with this. If you are using the spirometer at home, follow these instructions: [place] IF:  You are having difficultly using the spirometer. You have trouble using the spirometer as often as instructed. Your pain medication is not giving enough relief while using the spirometer. You develop fever of 100.5 F (38.1 C) or higher. SEEK IMMEDIATE MEDICAL CARE IF:  You cough up bloody sputum that had not been present before. You develop fever of 102 F (38.9 C) or greater. You develop worsening pain at or near the incision site. MAKE SURE YOU:  Understand these instructions. Will watch your condition. Will get help right away if you are not doing well or get worse. Document Released: 10/28/2006 Document Revised: 09/09/2011 Document Reviewed: 12/29/2006 [facility] Patient Information 2014 [place], [place].   ________________________________________________________________________

## 2021-08-03 ENCOUNTER — Encounter (HOSPITAL_COMMUNITY)
Admission: RE | Admit: 2021-08-03 | Discharge: 2021-08-03 | Disposition: A | Discharge status: Home / Self Care | Payer: Medicare Other | Source: Ambulatory Visit | Attending: Orthopedic Surgery | Admitting: Orthopedic Surgery

## 2021-08-03 ENCOUNTER — Other Ambulatory Visit: Payer: Self-pay

## 2021-08-03 ENCOUNTER — Encounter (HOSPITAL_COMMUNITY): Payer: Self-pay

## 2021-08-03 DIAGNOSIS — Z01818 Encounter for other preprocedural examination: Secondary | ICD-10-CM | POA: Insufficient documentation

## 2021-08-03 HISTORY — DX: Unspecified osteoarthritis, unspecified site: M19.90

## 2021-08-03 LAB — CBC
HCT: 46.4 % (ref 39.0–52.0)
Hemoglobin: 15.7 g/dL (ref 13.0–17.0)
MCH: 31.2 pg (ref 26.0–34.0)
MCHC: 33.8 g/dL (ref 30.0–36.0)
MCV: 92.1 fL (ref 80.0–100.0)
Platelets: 288 10*3/uL (ref 150–400)
RBC: 5.04 MIL/uL (ref 4.22–5.81)
RDW: 12.6 % (ref 11.5–15.5)
WBC: 6.8 10*3/uL (ref 4.0–10.5)
nRBC: 0 % (ref 0.0–0.2)

## 2021-08-03 LAB — SURGICAL PCR SCREEN
MRSA, PCR: NEGATIVE
Staphylococcus aureus: NEGATIVE

## 2021-08-03 LAB — BASIC METABOLIC PANEL
Anion gap: 7 (ref 5–15)
BUN: 19 mg/dL (ref 8–23)
CO2: 26 mmol/L (ref 22–32)
Calcium: 8.9 mg/dL (ref 8.9–10.3)
Chloride: 101 mmol/L (ref 98–111)
Creatinine, Ser: 1.14 mg/dL (ref 0.61–1.24)
GFR, Estimated: >60 mL/min (ref >60)
Glucose, Bld: 107 mg/dL — ABNORMAL HIGH (ref 70–99)
Potassium: 4 mmol/L (ref 3.5–5.1)
Sodium: 134 mmol/L — ABNORMAL LOW (ref 135–145)

## 2021-08-03 NOTE — Progress Notes (Signed)
COVID test- 08/10/21 at 9:00  Bowel prep reminder:NA  PCP - Dr. Danton Sewer Cardiologist - Dr. Jacqulynn Cadet  Chest x-ray - no EKG - 08/03/21-chart Stress Test - NA ECHO - NA Cardiac Cath - NA Pacemaker/ICD device last checked:NA  Sleep Study - no CPAP -   Fasting Blood Sugar - NA Checks Blood Sugar _____ times a day  Blood Thinner Instructions:No was taken off Plavix 05/2021 Aspirin Instructions: Last Dose:  Anesthesia review: Yes  Patient denies shortness of breath, fever, cough and chest pain at PAT appointment Pt had A-Fib from drinking energy drinks. No RBBB SR  Patient verbalized understanding of instructions that were given to them at the PAT appointment. Patient was also instructed that they will need to review over the PAT instructions again at home before surgery. yes

## 2021-08-06 ENCOUNTER — Ambulatory Visit
Admission: RE | Admit: 2021-08-06 | Discharge: 2021-08-06 | Disposition: A | Discharge status: Home / Self Care | Payer: Medicare Other | Source: Ambulatory Visit | Attending: Orthopedic Surgery | Admitting: Orthopedic Surgery

## 2021-08-06 ENCOUNTER — Other Ambulatory Visit: Payer: Self-pay

## 2021-08-06 DIAGNOSIS — M25512 Pain in left shoulder: Secondary | ICD-10-CM

## 2021-08-10 ENCOUNTER — Encounter (HOSPITAL_COMMUNITY)
Admission: RE | Admit: 2021-08-10 | Discharge: 2021-08-10 | Disposition: A | Discharge status: Home / Self Care | Payer: Medicare Other | Source: Ambulatory Visit | Attending: Orthopedic Surgery | Admitting: Orthopedic Surgery

## 2021-08-10 ENCOUNTER — Other Ambulatory Visit: Payer: Self-pay

## 2021-08-10 DIAGNOSIS — Z20822 Contact with and (suspected) exposure to covid-19: Secondary | ICD-10-CM | POA: Diagnosis not present

## 2021-08-10 DIAGNOSIS — Z01812 Encounter for preprocedural laboratory examination: Secondary | ICD-10-CM | POA: Insufficient documentation

## 2021-08-10 LAB — SARS CORONAVIRUS 2 (TAT 6-24 HRS): SARS Coronavirus 2: NEGATIVE

## 2021-08-13 ENCOUNTER — Encounter (HOSPITAL_COMMUNITY): Payer: Self-pay | Admitting: Orthopedic Surgery

## 2021-08-13 NOTE — Anesthesia Preprocedure Evaluation (Addendum)
Anesthesia Evaluation  Patient identified by MRN, date of birth, ID band Patient awake    Reviewed: Allergy & Precautions, NPO status , Patient's Chart, lab work & pertinent test results  Airway Mallampati: II  TM Distance: >3 FB     Dental   Pulmonary neg pulmonary ROS,    breath sounds clear to auscultation       Cardiovascular + dysrhythmias Atrial Fibrillation  Rhythm:Regular Rate:Normal     Neuro/Psych negative neurological ROS     GI/Hepatic negative GI ROS, Neg liver ROS,   Endo/Other  negative endocrine ROS  Renal/GU negative Renal ROS     Musculoskeletal  (+) Arthritis ,   Abdominal   Peds  Hematology   Anesthesia Other Findings   Reproductive/Obstetrics                            Anesthesia Physical Anesthesia Plan  ASA: 3  Anesthesia Plan: General   Post-op Pain Management: Regional block*   Induction: Intravenous  PONV Risk Score and Plan: 2 and Ondansetron, Dexamethasone and Midazolam  Airway Management Planned: Oral ETT  Additional Equipment:   Intra-op Plan:   Post-operative Plan: Extubation in OR  Informed Consent: I have reviewed the patients History and Physical, chart, labs and discussed the procedure including the risks, benefits and alternatives for the proposed anesthesia with the patient or authorized representative who has indicated his/her understanding and acceptance.     Dental advisory given  Plan Discussed with: CRNA and Anesthesiologist  Anesthesia Plan Comments:        Anesthesia Quick Evaluation

## 2021-08-14 ENCOUNTER — Ambulatory Visit (HOSPITAL_BASED_OUTPATIENT_CLINIC_OR_DEPARTMENT_OTHER): Payer: Medicare Other | Admitting: Anesthesiology

## 2021-08-14 ENCOUNTER — Ambulatory Visit (HOSPITAL_COMMUNITY)
Admission: RE | Admit: 2021-08-14 | Discharge: 2021-08-14 | Disposition: A | Discharge status: Home / Self Care | Payer: Medicare Other | Source: Ambulatory Visit | Attending: Orthopedic Surgery | Admitting: Orthopedic Surgery

## 2021-08-14 ENCOUNTER — Ambulatory Visit (HOSPITAL_COMMUNITY): Payer: Medicare Other

## 2021-08-14 ENCOUNTER — Encounter (HOSPITAL_COMMUNITY): Payer: Self-pay | Admitting: Orthopedic Surgery

## 2021-08-14 ENCOUNTER — Ambulatory Visit (HOSPITAL_COMMUNITY): Payer: Medicare Other | Admitting: Physician Assistant

## 2021-08-14 ENCOUNTER — Encounter (HOSPITAL_COMMUNITY)
Admission: RE | Disposition: A | Discharge status: Home / Self Care | Payer: Self-pay | Source: Ambulatory Visit | Attending: Orthopedic Surgery

## 2021-08-14 DIAGNOSIS — I4891 Unspecified atrial fibrillation: Secondary | ICD-10-CM

## 2021-08-14 DIAGNOSIS — Z09 Encounter for follow-up examination after completed treatment for conditions other than malignant neoplasm: Secondary | ICD-10-CM

## 2021-08-14 DIAGNOSIS — M19012 Primary osteoarthritis, left shoulder: Secondary | ICD-10-CM | POA: Insufficient documentation

## 2021-08-14 DIAGNOSIS — Z01812 Encounter for preprocedural laboratory examination: Secondary | ICD-10-CM

## 2021-08-14 HISTORY — PX: TOTAL SHOULDER ARTHROPLASTY: SHX126

## 2021-08-14 LAB — ABO/RH: ABO/RH(D): A POS

## 2021-08-14 LAB — TYPE AND SCREEN
ABO/RH(D): A POS
Antibody Screen: NEGATIVE

## 2021-08-14 SURGERY — ARTHROPLASTY, SHOULDER, TOTAL
Anesthesia: General | Site: Shoulder | Laterality: Left

## 2021-08-14 MED ORDER — ONDANSETRON HCL 4 MG/2ML IJ SOLN
INTRAMUSCULAR | Status: DC | PRN
  Administered 2021-08-14 (×2): 4 mg via INTRAVENOUS

## 2021-08-14 MED ORDER — LACTATED RINGERS IV SOLN: INTRAVENOUS | Status: DC

## 2021-08-14 MED ORDER — SUCCINYLCHOLINE CHLORIDE 200 MG/10ML IV SOSY
PREFILLED_SYRINGE | INTRAVENOUS | Status: AC
  Filled 2021-08-14: qty 10

## 2021-08-14 MED ORDER — BUPIVACAINE-EPINEPHRINE (PF) 0.5% -1:200000 IJ SOLN
INTRAMUSCULAR | Status: DC | PRN
  Administered 2021-08-14: 10 mL

## 2021-08-14 MED ORDER — DEXAMETHASONE SODIUM PHOSPHATE 10 MG/ML IJ SOLN
INTRAMUSCULAR | Status: DC | PRN
  Administered 2021-08-14: 8 mg via INTRAVENOUS

## 2021-08-14 MED ORDER — SUGAMMADEX SODIUM 200 MG/2ML IV SOLN
INTRAVENOUS | Status: DC | PRN
  Administered 2021-08-14: 219.6 mg via INTRAVENOUS

## 2021-08-14 MED ORDER — ALBUMIN HUMAN 5 % IV SOLN
INTRAVENOUS | Status: DC | PRN
Start: 2021-08-14 — End: 2021-08-14

## 2021-08-14 MED ORDER — PHENYLEPHRINE HCL (PRESSORS) 10 MG/ML IV SOLN
INTRAVENOUS | Status: AC
  Filled 2021-08-14: qty 1

## 2021-08-14 MED ORDER — FENTANYL CITRATE (PF) 100 MCG/2ML IJ SOLN
INTRAMUSCULAR | Status: DC | PRN
  Administered 2021-08-14 (×2): 50 ug via INTRAVENOUS
  Administered 2021-08-14 (×2): 25 ug via INTRAVENOUS
  Administered 2021-08-14: 50 ug via INTRAVENOUS

## 2021-08-14 MED ORDER — PHENYLEPHRINE HCL-NACL 20-0.9 MG/250ML-% IV SOLN
INTRAVENOUS | Status: DC | PRN
  Administered 2021-08-14: 80 ug/min via INTRAVENOUS

## 2021-08-14 MED ORDER — VANCOMYCIN HCL 1000 MG IV SOLR
INTRAVENOUS | Status: DC | PRN
  Administered 2021-08-14: 1000 mg

## 2021-08-14 MED ORDER — ORAL CARE MOUTH RINSE: 15.0000 mL | Freq: Once | OROMUCOSAL | Status: AC

## 2021-08-14 MED ORDER — ONDANSETRON HCL 4 MG/2ML IJ SOLN
INTRAMUSCULAR | Status: AC
  Filled 2021-08-14: qty 2

## 2021-08-14 MED ORDER — EPHEDRINE 5 MG/ML INJ
INTRAVENOUS | Status: AC
  Filled 2021-08-14: qty 5

## 2021-08-14 MED ORDER — PROPOFOL 10 MG/ML IV BOLUS
INTRAVENOUS | Status: DC | PRN
  Administered 2021-08-14: 160 mg via INTRAVENOUS

## 2021-08-14 MED ORDER — TRAMADOL HCL 50 MG PO TABS: 50.0000 mg | ORAL_TABLET | Freq: Four times a day (QID) | ORAL | 0 refills | Status: AC | PRN

## 2021-08-14 MED ORDER — VANCOMYCIN HCL 1000 MG IV SOLR
INTRAVENOUS | Status: AC
  Filled 2021-08-14: qty 20

## 2021-08-14 MED ORDER — HYDROCODONE-ACETAMINOPHEN 5-325 MG PO TABS: 1.0000 | ORAL_TABLET | Freq: Four times a day (QID) | ORAL | 0 refills | Status: AC | PRN

## 2021-08-14 MED ORDER — EPHEDRINE 5 MG/ML INJ
INTRAVENOUS | Status: AC
  Filled 2021-08-14: qty 10

## 2021-08-14 MED ORDER — TRANEXAMIC ACID-NACL 1000-0.7 MG/100ML-% IV SOLN
1000.0000 mg | INTRAVENOUS | Status: AC
  Administered 2021-08-14: 1000 mg via INTRAVENOUS
  Filled 2021-08-14: qty 100

## 2021-08-14 MED ORDER — DEXAMETHASONE SODIUM PHOSPHATE 10 MG/ML IJ SOLN
INTRAMUSCULAR | Status: AC
  Filled 2021-08-14: qty 1

## 2021-08-14 MED ORDER — ROPIVACAINE HCL 5 MG/ML IJ SOLN
INTRAMUSCULAR | Status: DC | PRN
  Administered 2021-08-14: 15 mL via PERINEURAL

## 2021-08-14 MED ORDER — SUCCINYLCHOLINE CHLORIDE 200 MG/10ML IV SOSY
PREFILLED_SYRINGE | INTRAVENOUS | Status: DC | PRN
  Administered 2021-08-14: 140 mg via INTRAVENOUS

## 2021-08-14 MED ORDER — MIDAZOLAM HCL 2 MG/2ML IJ SOLN
INTRAMUSCULAR | Status: AC
  Filled 2021-08-14: qty 2

## 2021-08-14 MED ORDER — LIDOCAINE 2% (20 MG/ML) 5 ML SYRINGE
INTRAMUSCULAR | Status: DC | PRN
  Administered 2021-08-14: 100 mg via INTRAVENOUS

## 2021-08-14 MED ORDER — FENTANYL CITRATE PF 50 MCG/ML IJ SOSY: 25.0000 ug | PREFILLED_SYRINGE | INTRAMUSCULAR | Status: DC | PRN

## 2021-08-14 MED ORDER — ROCURONIUM BROMIDE 10 MG/ML (PF) SYRINGE
PREFILLED_SYRINGE | INTRAVENOUS | Status: DC | PRN
  Administered 2021-08-14: 10 mg via INTRAVENOUS
  Administered 2021-08-14: 20 mg via INTRAVENOUS
  Administered 2021-08-14: 10 mg via INTRAVENOUS
  Administered 2021-08-14: 30 mg via INTRAVENOUS
  Administered 2021-08-14: 20 mg via INTRAVENOUS
  Administered 2021-08-14: 10 mg via INTRAVENOUS
  Administered 2021-08-14: 50 mg via INTRAVENOUS
  Administered 2021-08-14: 10 mg via INTRAVENOUS

## 2021-08-14 MED ORDER — BUPIVACAINE-EPINEPHRINE (PF) 0.25% -1:200000 IJ SOLN
INTRAMUSCULAR | Status: AC
  Filled 2021-08-14: qty 30

## 2021-08-14 MED ORDER — METOCLOPRAMIDE HCL 5 MG/ML IJ SOLN
INTRAMUSCULAR | Status: AC
  Filled 2021-08-14: qty 2

## 2021-08-14 MED ORDER — AMISULPRIDE (ANTIEMETIC) 5 MG/2ML IV SOLN
INTRAVENOUS | Status: AC
  Filled 2021-08-14: qty 4

## 2021-08-14 MED ORDER — CHLORHEXIDINE GLUCONATE 0.12 % MT SOLN
15.0000 mL | Freq: Once | OROMUCOSAL | Status: AC
  Administered 2021-08-14: 15 mL via OROMUCOSAL

## 2021-08-14 MED ORDER — CEFAZOLIN SODIUM-DEXTROSE 2-4 GM/100ML-% IV SOLN
INTRAVENOUS | Status: AC
  Filled 2021-08-14: qty 100

## 2021-08-14 MED ORDER — PHENYLEPHRINE 40 MCG/ML (10ML) SYRINGE FOR IV PUSH (FOR BLOOD PRESSURE SUPPORT)
PREFILLED_SYRINGE | INTRAVENOUS | Status: AC
  Filled 2021-08-14: qty 20

## 2021-08-14 MED ORDER — TRANEXAMIC ACID 1000 MG/10ML IV SOLN
2000.0000 mg | INTRAVENOUS | Status: DC
  Filled 2021-08-14: qty 20

## 2021-08-14 MED ORDER — PROPOFOL 10 MG/ML IV BOLUS
INTRAVENOUS | Status: AC
  Filled 2021-08-14: qty 20

## 2021-08-14 MED ORDER — 0.9 % SODIUM CHLORIDE (POUR BTL) OPTIME
TOPICAL | Status: DC | PRN
Start: 2021-08-14 — End: 2021-08-14
  Administered 2021-08-14: 250 mL

## 2021-08-14 MED ORDER — PHENYLEPHRINE 40 MCG/ML (10ML) SYRINGE FOR IV PUSH (FOR BLOOD PRESSURE SUPPORT)
PREFILLED_SYRINGE | INTRAVENOUS | Status: DC | PRN
  Administered 2021-08-14: 120 ug via INTRAVENOUS
  Administered 2021-08-14: 160 ug via INTRAVENOUS
  Administered 2021-08-14 (×3): 120 ug via INTRAVENOUS

## 2021-08-14 MED ORDER — MIDAZOLAM HCL 5 MG/5ML IJ SOLN
INTRAMUSCULAR | Status: DC | PRN
Start: 2021-08-14 — End: 2021-08-14
  Administered 2021-08-14 (×2): 1 mg via INTRAVENOUS

## 2021-08-14 MED ORDER — CEFAZOLIN SODIUM-DEXTROSE 2-4 GM/100ML-% IV SOLN
2.0000 g | INTRAVENOUS | Status: AC
  Administered 2021-08-14 (×2): 2 g via INTRAVENOUS
  Filled 2021-08-14: qty 100

## 2021-08-14 MED ORDER — METOCLOPRAMIDE HCL 5 MG/ML IJ SOLN
5.0000 mg | Freq: Once | INTRAMUSCULAR | Status: AC
  Administered 2021-08-14: 5 mg via INTRAVENOUS

## 2021-08-14 MED ORDER — FENTANYL CITRATE (PF) 250 MCG/5ML IJ SOLN
INTRAMUSCULAR | Status: AC
  Filled 2021-08-14: qty 5

## 2021-08-14 MED ORDER — ALBUMIN HUMAN 5 % IV SOLN
INTRAVENOUS | Status: AC
  Filled 2021-08-14: qty 250

## 2021-08-14 MED ORDER — SCOPOLAMINE 1 MG/3DAYS TD PT72
MEDICATED_PATCH | TRANSDERMAL | Status: AC
  Filled 2021-08-14: qty 1

## 2021-08-14 MED ORDER — AMISULPRIDE (ANTIEMETIC) 5 MG/2ML IV SOLN
10.0000 mg | Freq: Once | INTRAVENOUS | Status: AC
  Administered 2021-08-14: 10 mg via INTRAVENOUS

## 2021-08-14 MED ORDER — EPHEDRINE SULFATE-NACL 50-0.9 MG/10ML-% IV SOSY
PREFILLED_SYRINGE | INTRAVENOUS | Status: DC | PRN
  Administered 2021-08-14 (×7): 5 mg via INTRAVENOUS

## 2021-08-14 MED ORDER — SCOPOLAMINE 1 MG/3DAYS TD PT72
1.0000 | MEDICATED_PATCH | TRANSDERMAL | Status: DC
  Administered 2021-08-14: 1.5 mg via TRANSDERMAL

## 2021-08-14 MED ORDER — ROCURONIUM BROMIDE 10 MG/ML (PF) SYRINGE
PREFILLED_SYRINGE | INTRAVENOUS | Status: AC
  Filled 2021-08-14: qty 10

## 2021-08-14 MED ORDER — LIDOCAINE HCL (PF) 2 % IJ SOLN
INTRAMUSCULAR | Status: AC
  Filled 2021-08-14: qty 5

## 2021-08-14 MED ORDER — SODIUM CHLORIDE 0.9 % IR SOLN
Status: DC | PRN
  Administered 2021-08-14: 750 mL

## 2021-08-14 MED ORDER — PROMETHAZINE HCL 12.5 MG PO TABS: 12.5000 mg | ORAL_TABLET | Freq: Four times a day (QID) | ORAL | 0 refills | Status: DC | PRN

## 2021-08-14 SURGICAL SUPPLY — 78 items
ADH SKN CLS APL DERMABOND .7 (GAUZE/BANDAGES/DRESSINGS) ×1
AID PSTN UNV HD RSTRNT DISP (MISCELLANEOUS) ×1
APL SKNCLS STERI-STRIP NONHPOA (GAUZE/BANDAGES/DRESSINGS)
BAG COUNTER SPONGE SURGICOUNT (BAG) ×1 IMPLANT
BAG SPEC THK2 15X12 ZIP CLS (MISCELLANEOUS) ×1
BAG SPNG CNTER NS LX DISP (BAG) ×1
BAG ZIPLOCK 12X15 (MISCELLANEOUS) ×2 IMPLANT
BENZOIN TINCTURE PRP APPL 2/3 (GAUZE/BANDAGES/DRESSINGS) ×1 IMPLANT
BLADE SAW SGTL 18X1.27X75 (BLADE) ×2 IMPLANT
BLADE SURG SZ10 CARB STEEL (BLADE) ×4 IMPLANT
CEMENT BONE DEPUY (Cement) ×3 IMPLANT
DERMABOND ADVANCED (GAUZE/BANDAGES/DRESSINGS) ×1
DERMABOND ADVANCED .7 DNX12 (GAUZE/BANDAGES/DRESSINGS) IMPLANT
DRAPE POUCH INSTRU U-SHP 10X18 (DRAPES) ×1 IMPLANT
DRAPE SHEET LG 3/4 BI-LAMINATE (DRAPES) ×1 IMPLANT
DRAPE U-SHAPE 47X51 STRL (DRAPES) ×2 IMPLANT
DRESSING AQUACEL AG SP 3.5X10 (GAUZE/BANDAGES/DRESSINGS) IMPLANT
DRSG ADAPTIC 3X8 NADH LF (GAUZE/BANDAGES/DRESSINGS) ×1 IMPLANT
DRSG AQUACEL AG SP 3.5X10 (GAUZE/BANDAGES/DRESSINGS) ×2
DURAPREP 26ML APPLICATOR (WOUND CARE) ×1 IMPLANT
ELECT BLADE TIP CTD 4 INCH (ELECTRODE) ×2 IMPLANT
ELECT NDL TIP 2.8 STRL (NEEDLE) ×1 IMPLANT
ELECT NEEDLE TIP 2.8 STRL (NEEDLE) IMPLANT
ELECT PENCIL ROCKER SW 15FT (MISCELLANEOUS) ×1 IMPLANT
ELECT REM PT RETURN 15FT ADLT (MISCELLANEOUS) ×2 IMPLANT
EVACUATOR 1/8 PVC DRAIN (DRAIN) IMPLANT
EVACUATOR SILICONE 100CC (DRAIN) IMPLANT
FACESHIELD WRAPAROUND (MASK) ×6 IMPLANT
FACESHIELD WRAPAROUND OR TEAM (MASK) ×3 IMPLANT
FIBER TAPE 2MM (SUTURE) ×3 IMPLANT
GAUZE SPONGE 4X4 12PLY STRL (GAUZE/BANDAGES/DRESSINGS) ×2 IMPLANT
GLENOID PEGGED CORTILOC M40 (Orthopedic Implant) IMPLANT
GLOVE SRG 8 PF TXTR STRL LF DI (GLOVE) ×1 IMPLANT
GLOVE SURG ENC MOIS LTX SZ7.5 (GLOVE) ×4 IMPLANT
GLOVE SURG POLYISO LF SZ7.5 (GLOVE) ×1 IMPLANT
GLOVE SURG UNDER POLY LF SZ8 (GLOVE) ×2
GOWN SPEC L4 XLG W/TWL (GOWN DISPOSABLE) ×2 IMPLANT
GUIDE PIN 3X75 SHOULDER (PIN) ×2
GUIDEWIRE GLENOID 2.5X220 (WIRE) ×1 IMPLANT
HANDPIECE INTERPULSE COAX TIP (DISPOSABLE) ×2
HEAD HUM 48X18 (Miscellaneous) ×1 IMPLANT
KIT BASIN OR (CUSTOM PROCEDURE TRAY) ×2 IMPLANT
KIT TURNOVER KIT A (KITS) ×1 IMPLANT
NDL MAYO CATGUT SZ4 TPR NDL (NEEDLE) ×1 IMPLANT
NEEDLE MAYO CATGUT SZ4 (NEEDLE) ×2 IMPLANT
NUCLEUS SHOULDER SZ 2 (Miscellaneous) IMPLANT
NUCLEUS SZ 2 (Miscellaneous) ×2 IMPLANT
PACK SHOULDER (CUSTOM PROCEDURE TRAY) ×2 IMPLANT
PAD COLD SHLDR WRAP-ON (PAD) ×1 IMPLANT
PASSER SUT SWANSON 36MM LOOP (INSTRUMENTS) ×2 IMPLANT
PEGGED GLENOID CORTILOC (Orthopedic Implant) ×1 IMPLANT
PIN GUIDE 3X75 SHOULDER (PIN) IMPLANT
PROTECTOR NERVE ULNAR (MISCELLANEOUS) ×1 IMPLANT
RESTRAINT HEAD UNIVERSAL NS (MISCELLANEOUS) ×2 IMPLANT
SET HNDPC FAN SPRY TIP SCT (DISPOSABLE) ×1 IMPLANT
SLING ARM IMMOBILIZER LRG (SOFTGOODS) ×2 IMPLANT
SMARTMIX MINI TOWER (MISCELLANEOUS)
SPONGE T-LAP 18X18 ~~LOC~~+RFID (SPONGE) ×2 IMPLANT
SPONGE T-LAP 4X18 ~~LOC~~+RFID (SPONGE) ×2 IMPLANT
STAPLER VISISTAT 35W (STAPLE) ×1 IMPLANT
STRIP CLOSURE SKIN 1/2X4 (GAUZE/BANDAGES/DRESSINGS) ×1 IMPLANT
SUCTION FRAZIER HANDLE 12FR (TUBING) ×1
SUCTION TUBE FRAZIER 12FR DISP (TUBING) IMPLANT
SUPPORT WRAP ARM LG (MISCELLANEOUS) ×1 IMPLANT
SUT ETHIBOND NAB CT1 #1 30IN (SUTURE) ×8 IMPLANT
SUT FIBERWIRE #2 38 REV NDL BL (SUTURE) ×2
SUT MON AB 2-0 CT1 36 (SUTURE) ×1 IMPLANT
SUT MON AB 3-0 SH 27 (SUTURE) ×2
SUT MON AB 3-0 SH27 (SUTURE) IMPLANT
SUT VIC AB 0 CT1 36 (SUTURE) ×1 IMPLANT
SUT VIC AB 2-0 CT1 27 (SUTURE) ×4
SUT VIC AB 2-0 CT1 TAPERPNT 27 (SUTURE) ×2 IMPLANT
SUTURE FIBERWR#2 38 REV NDL BL (SUTURE) IMPLANT
TOWEL OR 17X26 10 PK STRL BLUE (TOWEL DISPOSABLE) ×2 IMPLANT
TOWER CARTRIDGE SMART MIX (DISPOSABLE) ×2 IMPLANT
TOWER SMARTMIX MINI (MISCELLANEOUS) IMPLANT
TRAY FOLEY MTR SLVR 16FR STAT (SET/KITS/TRAYS/PACK) ×2 IMPLANT
TUBE SUCTION HIGH CAP CLEAR NV (SUCTIONS) ×1 IMPLANT

## 2021-08-14 NOTE — Op Note (Addendum)
OPERATIVE NOTE  Raymond Walsh male 66 y.o. 08/14/2021  PREOPERATIVE DIAGNOSIS: Left glenohumeral osteoarthritis  POSTOPERATIVE DIAGNOSIS: Left glenohumeral osteoarthritis  PROCEDURE(S): Left anatomic total shoulder replacement (84166)  SURGEON: [name], M.D.  ASSISTANT(S): Nehemiah Massed, PA-C  Nehemiah Massed, PA-C, assisted throughout and was invaluable to the completion of the case by helping with retraction and maintaining exposure while hardware and/or components were placed.   ANESTHESIA: General  FINDINGS: Preoperative Examination: Active and passive forward flexion 120 degrees, external rotation at side about 25 degrees actively and passively.  5/5 supraspinatus, infraspinatus, and subscapularis isolation strength.  Intact EDC, FDP index and small finger, APB, and dorsal interossei strength and motion.  Sensation tact light touch in the median, radial, ulnar distributions.  Normal distal pulse.  Warm and well-perfused distally.  Operative Findings: End-stage glenohumeral osteoarthritis.  Intact rotator cuff.  Appropriate positioning and stable fixation of anatomic total shoulder arthroplasty components.  During trialing and following placement of components, 50% posterior translation of humeral head on the glenoid with spontaneous return.  Anatomic reduction and stable fixation of subscapularis lesser tuberosity osteotomy.  IMPLANTS: Implant Name Type Inv. Item Serial No. Manufacturer Lot No. LRB No. Used Action  PEGGED Victorio Palm - [id number] Orthopedic Implant PEGGED GLENOID CORTILOC [id number] [employer]  Left 1 Implanted  CEMENT BONE DEPUY - [id number] Cement CEMENT BONE DEPUY  DEPUY ORTHOPAEDICS [id number] Left 1 Implanted  NUCLEUS SZ 2 - [id number] Miscellaneous NUCLEUS SZ 2 [id number] [employer]  Left 1 Implanted  HEAD HUM 48X18 - [id number] Miscellaneous HEAD HUM 48X18 [id number] [employer]  Left 1 Implanted    INDICATIONS:  The patient is a 66  y.o. male with end-stage primary glenohumeral osteoarthritis of his left shoulder.  He had undergone extensive conservative treatment and was interested in pursuing definitive treatment due to the limitations in his activity and ADLs.  MRI obtained preoperatively demonstrated intact rotator cuff, so he was recommended to undergo total shoulder arthroplasty with anatomic arthroplasty components.  He understood the risks, benefits and alternatives to surgery which include but are not limited to bleeding, wound healing complications, infection, damage to surrounding structures, persistent pain, stiffness, lack of improvement, potential for subsequent arthritis or worsening of pre-existing arthritis, and need for further surgery, as well as complications related to anesthesia, cardiovascular complications, and death.  He also understood the potential for continued pain in that there were no guarantees of acceptable outcome.  After weighing these risks the patient opted to proceed with surgery.  TECHNIQUE: Patient was identified in the preoperative holding area.  The left shoulder was marked by myself.  Consent was signed by myself and the patient.  Interscalene block was performed by anesthesia in the preoperative holding area.  Patient was taken to the operative suite and placed supine on the operative table.  Anesthesia was induced by the anesthesia team.  The patient was positioned appropriately for the procedure and all bony prominences were well padded.  A tourniquet was not used.  Preoperative antibiotics were given. The extremity was prepped and draped in the usual sterile fashion and surgical timeout was performed.  Patient was positioned in the beachchair position.  Surface anatomy was marked out, and a longitudinal incision was marked out on the skin extending distally from the coracoid along the deltopectoral interval.  Skin was incised sharply.  Underlying subcutaneous tissues were dissected down the  deltopectoral interval with Bovie electrocautery, identifying, protecting, and releasing the cephalic vein laterally.  Deltopectoral approach down onto the shoulder was performed.  The biceps tendon and overlying sheath was identified and opened, along with the rotator interval, taking care to preserve the rotator interval, tissue for repair at the conclusion of the case.  The upper fibers of the pectoralis major tendon were released to facilitate exposure.  The biceps tendon was released approximately and tenodesed to the upper border of the pectoralis major with #1 Ethibond.  The remaining portion of the long head of the biceps was excised.  The upper border of the subscap and the remaining rotator interval tissue was released back to the coracoid.  The lesser tuberosity was osteotomized, and the remaining subscap surrounding the osteotomy was released with Bovie electrocautery.  The subscap was tagged with three #1 Ethibond sutures.  Capsule was released off the inside of the subscap and the anterior and inferior capsular tissue was excised.  The capsule was released inferiorly off the humerus.  There is extensive humeral osteophytosis.  Osteophytes were debrided taking care to preserve the native anatomy.  Using the 135 degree cut guide, the humeral head was resected, taking care to preserve the superior and posterior superior rotator cuff.  Central guidewire for the humeral head/nucleus components was placed bicortically, using a humeral head trial as a guide, in the center of the cut humerus.  The humeral cup was reamed onto a flat surface.  The cut surface was sized to a size 2 nucleus.  The nucleus trial was impacted in position and cut protector was placed on the humeral cut.  Attention was then turned to glenoid preparation.  Anterior and inferior capsular tissue was released and excised.  Remaining portion of the long head of the biceps as long as the labrum were excised from the glenoid.  The posterior  capsule was released.  Central guidewire for the glenoid was driven towards Madsen's point using the glenoid guide centrally in the glenoid.  Glenoid was then prepared and reamed.  Central and peg holes were drilled through the guide.  A trial glenoid was placed and achieved a fully seated position without any rocking, so a 40 medium glenoid was selected.  The glenoid was copiously irrigated.  The peripheral peg holes were dried and excellent hemostasis was obtained.  Pressurized cement was injected into the peripheral holes, and a 40 medium glenoid was impacted into position and stably held under pressure until the cement dried.  Attention was turned back to the humerus.  The humeral head was delivered anteriorly wound.  Cut protector was removed.  A size 48 x 18 trial head was placed on the trial nucleus and the humerus was reduced.  This resulted in stable shoulder without overstuffing, with 50% posterior translation with spontaneous return.  Trial components were removed and 3 holes were drilled to repair the lesser tuberosity osteotomy, and #2 fiber tape was passed through these holes.  #2 nucleus was impacted into position, followed by size 48 x 18 humeral head.  Head was again reduced and lesser tuberosity was repaired in an anatomic position with the central tape tied first holding the tuberosity reduced, followed by the proximal and distal tapes.  The rotator interval, tissue was likewise repaired in an anatomic tension-free position with interrupted figure-of-eight #2 FiberWire sutures.  Wound was copiously irrigated and hemostasis was obtained.  Vancomycin powder was placed deep in the wound, and the deltopectoral interval was approximated with interrupted figure-of-eight 0 Vicryl stitches.  Deeper fat layers were repaired with simple inverted interrupted 0 Vicryl, followed by 2-0 Vicryl deep dermal simple inverted  interrupted, followed by running 3-0 Monocryl subcuticular.  Skin was sealed with Dermabond  and an Aquacel dressing was placed.  The arm was placed in a sling immobilizer.  Patient was awakened from anesthesia and transferred to PACU in stable condition.  He tolerated procedure well.  There were no complications.  POST OPERATIVE INSTRUCTIONS: Mobility: Arm in sling at all times for 6 weeks unless doing therapy according to protocol, or for hygiene Pain control: Continue to wean/titrate to appropriate oral regimen DVT Prophylaxis: 81 mg aspirin twice a day for 1 week Further surgical plans: None LUE: Nonweightbearing Disposition: Home; follow up in clinic within 2 weeks Dressing care: Keep Aquacel dressing on and dry and intact for 5 days.  Do not allow surgical area to get wet before that.  Remove Aquacel dressing after 5 days and allow area to get wet in shower but do not submerge.  Skin glue was used, so no further dressing is necessary. Follow-up: Please call [facility] [phone]) to schedule follow-op appointment for 2 weeks after surgery.  TOURNIQUET TIME: N/A  BLOOD LOSS: 300 mL         DRAINS: none         SPECIMEN: none       COMPLICATIONS:  * No complications entered in OR log *         DISPOSITION: PACU - hemodynamically stable.         CONDITION: stable                                                                      [name] M.D. Orthopaedic Surgery [facility]   Portions of the record have been created with voice recognition software.  Grammatical and punctuation errors, random word insertions, wrong-word or "sound-a-like" substitutions, pronoun errors (inaccuracies and/or substitutions), and/or incomplete sentences may have occurred due to the inherent limitations of voice recognition software.  Not all errors are caught or corrected.  Although every attempt is made to root out erroneous and incomplete transcription, the note may still not fully represent the intent or opinion of the  author.  Read the chart carefully and recognize, using context, where errors/substitutions have occurred.  Any questions or concerns about the content of this note or information contained within the body of this dictation should be addressed directly with the author for clarification.

## 2021-08-14 NOTE — Transfer of Care (Signed)
Immediate Anesthesia Transfer of Care Note  Patient: Raymond Walsh  Procedure(s) Performed: LEFT TOTAL SHOULDER ARTHROPLASTY (Left: Shoulder)  Patient Location: PACU  Anesthesia Type:GA combined with regional for post-op pain  Level of Consciousness: drowsy and patient cooperative  Airway & Oxygen Therapy: Patient Spontanous Breathing and Patient connected to face mask oxygen  Post-op Assessment: Report given to RN and Post -op Vital signs reviewed and stable  Post vital signs: Reviewed and stable  Last Vitals:  Vitals Value Taken Time  BP 120/76 08/14/21 1322  Temp 36.5 C 08/14/21 1322  Pulse 93 08/14/21 1325  Resp 26 08/14/21 1325  SpO2 88 % 08/14/21 1325  Vitals shown include unvalidated device data.  Last Pain:  Vitals:   08/14/21 0552  TempSrc:   PainSc: 6       Patients Stated Pain Goal: 5 (36/06/77 0340)  Complications: No notable events documented.

## 2021-08-14 NOTE — Discharge Instructions (Signed)
Discharge instructions for Dr. Keveon Amsler, M.D.: Please refer to the two-sided discharge instructions paper that Dr. Kathi Dohn placed in the patient's paper chart. Please give this to the patient to take home after reviewing with the patient!!   General discharge instructions:  PLEASE REFER TO TWO-SIDED PAPER INSTRUCTIONS IN PAPER CHART FOR SPECIFIC INSTRUCTIONS!!!  Diet: As you were doing prior to hospitalization. Shower:  Unless otherwise specified (i.e. on two-sided paper instructions with paper chart) may shower but keep the wounds dry, use an occlusive plastic wrap, NO SOAKING IN TUB.  If the bandage gets wet, change with a clean dry gauze. Dressing:  Unless otherwise specified (i.e. on two-sided paper instructions with paper chart), may change your dressing 3-5 days after surgery.  Then change the dressing daily with sterile gauze dressing.  If there are sticky tapes (steri-strips) on your wounds and all the stitches are absorbable.  Leave the steri-strips in place when changing your dressings, they will peel off with time, usually 2-3 weeks. Activity:  Increase activity slowly as tolerated, but follow the restrictions on the two-sided paper discharge instructions sheet that Dr. Chino Sardo placed in the paper chart.  No lifting or driving for 6 weeks. Weight Bearing: NON-WEIGHTBEARING (NWB) on the RIGHT UPPER EXTREMITY. To prevent constipation: You may use over-the-counter stool softener(s) such as Colace (over the counter) 100 mg by mouth twice a day and/or Miralax (over the counter) for constipation as needed.  Drink plenty of fluids (prune juice may be helpful) and high fiber foods.  Itching:  If you experience itching with your medications, try taking only a single pain pill, or even half a pain pill at a time.  You can also use benadryl over the counter for itching or also to help with sleep.  Precautions:  If you experience chest pain or shortness of breath - call 911 immediately for transfer to  the hospital emergency department!!  PLEASE REFER TO TWO-SIDED PAPER INSTRUCTIONS IN PAPER CHART FOR SPECIFIC INSTRUCTIONS!!!  If you develop a fever greater that 101.1 deg F, purulent drainage from wound, increased redness or drainage from wound, or calf pain -- Call the office at [phone].  

## 2021-08-14 NOTE — Anesthesia Procedure Notes (Signed)
Procedure Name: Intubation Date/Time: 08/14/2021 7:36 AM Performed by: Montel Clock, CRNA Pre-anesthesia Checklist: Patient identified, Emergency Drugs available, Suction available, Patient being monitored and Timeout performed Patient Re-evaluated:Patient Re-evaluated prior to induction Oxygen Delivery Method: Circle system utilized Preoxygenation: Pre-oxygenation with 100% oxygen Induction Type: IV induction Ventilation: Mask ventilation without difficulty Laryngoscope Size: Mac and 4 Grade View: Grade II Tube type: Oral Tube size: 7.5 mm Number of attempts: 1 Airway Equipment and Method: Stylet Placement Confirmation: ETT inserted through vocal cords under direct vision, positive ETCO2 and breath sounds checked- equal and bilateral Secured at: 23 cm Tube secured with: Tape Dental Injury: Teeth and Oropharynx as per pre-operative assessment

## 2021-08-14 NOTE — Anesthesia Procedure Notes (Addendum)
°  Anesthesia Regional Block: Interscalene brachial plexus block   Pre-Anesthetic Checklist: , timeout performed,  Correct Patient, Correct Site, Correct Laterality,  Correct Procedure, Correct Position, site marked,  Risks and benefits discussed,  Surgical consent,  Pre-op evaluation,  At surgeon's request and post-op pain management  Laterality: Left  Prep: chloraprep       Needles:  Injection technique: Single-shot      Additional Needles:   Procedures: Doppler guided,,,, ultrasound used (permanent image in chart),,    Narrative:  Start time: 08/14/2021 6:55 AM End time: 08/14/2021 7:10 AM Injection made incrementally with aspirations every 5 mL.  Performed by: Personally  Anesthesiologist: Belinda Block, MD

## 2021-08-14 NOTE — H&P (Addendum)
PREOPERATIVE H&P  HPI: Raymond Walsh is a 66 y.o. male who has presented today for surgery, with the diagnosis of left shoulder osteoarthritis.  The various methods of treatment have been discussed with the patient and family.  After consideration of risks, benefits, and other options for treatment, the patient has consented to LEFT TOTAL SHOULDER ARTHROPLASTY as a surgical intervention.  The patient's history has been reviewed, patient examined, no change in status, stable for surgery.  I have reviewed the patient's chart and labs.  Questions were answered to the patient's satisfaction.    PMH: Past Medical History:  Diagnosis Date   A-fib [facility])    Arthritis    hands,   Cancer ([place])    prostate   Dysrhythmia 08/2019   A-fib from energy medication    Home Medications Allergies  No current facility-administered medications on file prior to encounter.   Current Outpatient Medications on File Prior to Encounter  Medication Sig Dispense Refill   fluticasone (FLONASE) 50 MCG/ACT nasal spray Place 1 spray into both nostrils daily as needed for allergies or rhinitis.     magnesium oxide (MAG-OX) 400 MG tablet Take 400 mg by mouth daily.     OVER THE COUNTER MEDICATION Take 2 Scoops by mouth daily. Nitric Oxide Nutrition     tamsulosin (FLOMAX) 0.4 MG CAPS capsule Take 0.4 mg by mouth at bedtime.     Allergies  Allergen Reactions   Hydromorphone Hcl Nausea And Vomiting   Oxycodone Nausea And Vomiting and Rash   Oxycodone-Acetaminophen Nausea And Vomiting and Rash     PSH: Past Surgical History:  Procedure Laterality Date   BACK SURGERY  2000   plate in I2--M3     Family History Social History  History reviewed. No pertinent family history.  Social History   Socioeconomic History   Marital status: Married    Spouse name: Not on file   Number of children: Not on file   Years of education: Not on file   Highest education level: Not on file  Occupational History   Not on file   Tobacco Use   Smoking status: Never   Smokeless tobacco: Never  Vaping Use   Vaping Use: Never used  Substance and Sexual Activity   Alcohol use: Yes    Comment: rare   Drug use: Yes    Frequency: 1.0 times per week    Types: Marijuana   Sexual activity: Not on file  Other Topics Concern   Not on file  Social History Narrative   Not on file   Social Determinants of Health   Financial Resource Strain: Not on file  Food Insecurity: Not on file  Transportation Needs: Not on file  Physical Activity: Not on file  Stress: Not on file  Social Connections: Not on file     Review of Systems: MSK: As noted per HPI above GI: No current Nausea/vomiting ENT: Denies sore throat, epistaxis CV: Denies chest pain Resp: No current shortness of breath  Other than mentioned above, there are no Constitutional, Neurological, Psychiatric, ENT, Ophthalmological, Cardiovascular, Respiratory, GI, GU, Musculoskeletal, Integumentary, Lymphatic, Endocrine or Allergic issues.   Physical Examination: CV: Normal distal pulses Lungs: Unlabored respirations RUE: FF 120 deg, ER at side 25 deg.  5/5 supra/infra/subscap.  +EDC, FDP index and small, APD, and DIO.  SILT M/U/R.  Normal radial pulse.  WWP.  Assessment/Plan: LEFT TOTAL SHOULDER ARTHROPLASTY    [name] M.D. Orthopaedic Surgery [facility]  Review  of this patient's medications prescribed by other providers does not in any way constitute an endorsement by this clinician of their use, indications, dosage, route, efficacy, interactions, or other clinical parameters.  Portions of the record have been created with voice recognition software.  Grammatical and punctuation errors, random word insertions, wrong-word or "sound-a-like" substitutions, pronoun errors (inaccuracies and/or substitutions), and/or incomplete sentences may have occurred due to the inherent limitations of voice recognition software.  Not  all errors are caught or corrected.  Although every attempt is made to root out erroneous and incomplete transcription, the note may still not fully represent the intent or opinion of the author.  Read the chart carefully and recognize, using context, where errors/substitutions have occurred.  Any questions or concerns about the content of this note or information contained within the body of this dictation should be addressed directly with the author for clarification.

## 2021-08-14 NOTE — Anesthesia Postprocedure Evaluation (Signed)
Anesthesia Post Note  Patient: Raymond Walsh  Procedure(s) Performed: LEFT TOTAL SHOULDER ARTHROPLASTY (Left: Shoulder)     Patient location during evaluation: PACU Anesthesia Type: General Level of consciousness: awake Pain management: pain level controlled Vital Signs Assessment: vitals unstable Respiratory status: spontaneous breathing Cardiovascular status: stable Postop Assessment: no apparent nausea or vomiting Anesthetic complications: no   No notable events documented.  Last Vitals:  Vitals:   08/14/21 1500 08/14/21 1533  BP: 105/75 127/79  Pulse: 97 99  Resp: (!) 21 18  Temp:  36.7 C  SpO2: 93% 93%    Last Pain:  Vitals:   08/14/21 1533  TempSrc:   PainSc: 0-No pain                 Nathalia Wismer

## 2021-08-15 ENCOUNTER — Encounter (HOSPITAL_COMMUNITY): Payer: Self-pay | Admitting: Orthopedic Surgery

## 2021-08-16 ENCOUNTER — Ambulatory Visit: Payer: Medicare Other | Attending: Orthopedic Surgery | Admitting: Rehabilitative and Restorative Service Providers"

## 2021-08-16 ENCOUNTER — Encounter: Payer: Self-pay | Admitting: Rehabilitative and Restorative Service Providers"

## 2021-08-16 ENCOUNTER — Other Ambulatory Visit: Payer: Self-pay

## 2021-08-16 DIAGNOSIS — R29898 Other symptoms and signs involving the musculoskeletal system: Secondary | ICD-10-CM | POA: Insufficient documentation

## 2021-08-16 DIAGNOSIS — G8929 Other chronic pain: Secondary | ICD-10-CM | POA: Diagnosis present

## 2021-08-16 DIAGNOSIS — M6281 Muscle weakness (generalized): Secondary | ICD-10-CM | POA: Insufficient documentation

## 2021-08-16 DIAGNOSIS — Z9889 Other specified postprocedural states: Secondary | ICD-10-CM | POA: Insufficient documentation

## 2021-08-16 DIAGNOSIS — R293 Abnormal posture: Secondary | ICD-10-CM | POA: Insufficient documentation

## 2021-08-16 DIAGNOSIS — M25512 Pain in left shoulder: Secondary | ICD-10-CM | POA: Insufficient documentation

## 2021-08-16 NOTE — Patient Instructions (Signed)
Access Code: [email or web address] URL: [email or web address] Date: 08/16/2021 Prepared by: Gillermo Murdoch  Exercises Circular Shoulder Pendulum with Table Support - 3-4 x daily - 7 x weekly - 1 sets - 20-30 reps Seated Shoulder Flexion Towel Slide at Table Top Full Range of Motion - 2 x daily - 7 x weekly - 1 sets - 5-10 reps - 10sec hold Standing Shoulder External Rotation AAROM with Dowel - 2 x daily - 7 x weekly - 1 sets - 5-10 reps - 10 sec hold

## 2021-08-16 NOTE — Therapy (Signed)
[place], [place], [address] Phone: [phone]   Fax:  [phone]  Physical Therapy Evaluation  Patient Details  Name: Raymond Walsh MRN: [id number] Date of Birth: Apr 14, 1956 Referring Provider (PT): Dr [name]   Encounter Date: 08/16/2021   PT End of Session - 08/16/21 1016     Visit Number 1    Number of Visits 32    Date for PT Re-Evaluation 12/06/21    PT Start Time 0937    PT Stop Time 1028    PT Time Calculation (min) 51 min    Activity Tolerance Patient tolerated treatment well             Past Medical History:  Diagnosis Date   A-fib [facility])    Arthritis    hands,   Cancer ([place])    prostate   Dysrhythmia 08/2019   A-fib from energy medication    Past Surgical History:  Procedure Laterality Date   BACK SURGERY  2000   plate in K2--I0   TOTAL SHOULDER 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    Subjective Assessment - 08/16/21 0937     Subjective Patient reports that he continued to work on HEP program for [employer] but continued to have significant pain in the shoulder. He underwent Lt TSA 9/73/53 without complications. Having continued post op pain.    Pertinent History arthritis; Rt TKA    Patient Stated Goals get to use the Lt arm again and be able to work without the shoulder hurting    Currently in Pain? Yes    Pain Score 4     Pain Location Shoulder    Pain Orientation Left    Pain Descriptors / Indicators Constant;Nagging    Pain Type Surgical pain;Chronic pain    Pain Onset More than a month ago    Pain Frequency Constant    Aggravating Factors  moving arm    Pain Relieving Factors meds; ice                [facility] PT Assessment - 08/16/21 0001       Assessment    Medical Diagnosis Lt TSA    Referring Provider (PT) Dr [name]    Onset Date/Surgical Date 08/14/21    Hand Dominance Right    Next MD Visit 08/29/21    Prior Therapy here for Lt shoulder pain      Precautions   Precautions Shoulder    Type of Shoulder Precautions per protocol      Balance Screen   Has the patient fallen in the past 6 months No    Has the patient had a decrease in activity level because of a fear of falling?  No    Is the patient reluctant to leave their home because of a fear of falling?  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 [employer]    Leisure yard work;ffeeds and  watches bird      Observation/Other Assessments   Observations bandage Lt anterior shoulder dry and intact    Focus on Therapeutic Outcomes (FOTO)  4      Observation/Other Assessments-Edema    Edema --   edema noted Lt shoulder girdle     Sensation   Additional Comments WFL's per pt report      Posture/Postural Control   Posture Comments head forward; shoulders rounded; increasd thoracic kyphosis      AROM   Right Shoulder Extension 62 Degrees    Right Shoulder Flexion 158 Degrees    Right Shoulder ABduction 158 Degrees    Right Shoulder Internal Rotation --   thumb T10   Right Shoulder External Rotation 90 Degrees   shoulder at 90 deg abd/elbow 90 deg     PROM   Left Shoulder Flexion 60 Degrees    Left Shoulder ABduction 25 Degrees    Left Shoulder External Rotation 0 Degrees      Strength   Overall Strength Comments Rt UE - WFL's Lt not tested      Palpation   Palpation comment muscular tightness Lt upper quarter                        Objective  measurements completed on examination: See above findings.       [facility] Adult PT Treatment/Exercise - 08/16/21 0001       Shoulder Exercises: Seated   External Rotation AAROM;Left;5 reps    External Rotation Limitations using cane to assist with ER - no ER > 30 deg (today to neutral per pt tolerance)    Flexion AAROM;Left;5 reps    Flexion Limitations table slide 10 sec hold x 5 reps      Shoulder Exercises: ROM/Strengthening   Pendulum 30 reps fwd/back; circle CW      Vasopneumatic   Number Minutes Vasopneumatic  15 minutes    Vasopnuematic Location  Shoulder    Vasopneumatic Pressure Low    Vasopneumatic Temperature  34      Manual Therapy   Passive ROM Lt shoulder flexion to ~ 60 deg; ER to neutral                     PT Education - 08/16/21 1001     Education Details POC HEP    Person(s) Educated Patient    Methods Explanation;Demonstration;Tactile cues;Verbal cues;Handout    Comprehension Verbalized understanding;Returned demonstration;Verbal cues required;Tactile cues required              PT Short Term Goals - 08/16/21 1254       PT SHORT TERM GOAL #1   Title Improve posture and alignment with patient to demonstrate improve upright posture with posterior shoulder girdle engaged    Time 8    Period Weeks    Status New    Target Date 10/11/21      PT SHORT TERM GOAL #2   Title Patient to demonstrate AROM Lt shoulder to 90-100 degrees; ER to 25 degrees in scapular plane    Time 8    Period Weeks    Status New    Target Date 10/11/21      PT SHORT TERM GOAL #3   Title Patient to demonstrate 3+/5 to 4/5 strength Lt shoulder in available range    Time 8    Period Weeks    Status New    Target Date 10/11/21  PT Long Term Goals - 08/16/21 1302       PT LONG TERM GOAL #1   Title Patient to demonstrate AROM Lt shoulder to 150-160 deg flexion; 75 degrees ER; 60 degrees IR    Time 16    Period Weeks    Status New     Target Date 12/06/21      PT LONG TERM GOAL #2   Title Increase strength Lt shoulder to 4/5 for ER; IR; abduction    Time 16    Period Weeks    Status New    Target Date 12/06/21      PT LONG TERM GOAL #3   Title Patient to report improved ability to reach overhead functionally    Time 16    Period Weeks    Status New    Target Date 12/06/21      PT LONG TERM GOAL #4   Title Independent in HEP    Time 16    Period Weeks    Status New    Target Date 12/06/21      PT LONG TERM GOAL #5   Title Improve functional limitation score to 57    Time 16    Period Weeks    Status New    Target Date 12/06/21                    Plan - 08/16/21 1249     Clinical Impression Statement Patient presents s/p Lt TSA 08/14/21 with post op bandage and abduction sling in place. He has poor posture; limited PROM/AROM, strength, function Lt UE. Patient will benefit from PT to address problems identified.    Examination-Activity Limitations Bathing;Bed Mobility;Carry;Dressing;Hygiene/Grooming;Lift;Reach Overhead;Sleep;Toileting    Examination-Participation Restrictions Cleaning;Community Activity;Driving;Meal Prep;Occupation    Stability/Clinical Decision Making Stable/Uncomplicated    Clinical Decision Making Low    Rehab Potential Good    PT Frequency 2x / week    PT Duration Other (comment)   16 weeks   PT Treatment/Interventions ADLs/Self Care Home Management;Aquatic Therapy;Cryotherapy;Electrical Stimulation;Iontophoresis 4mg /ml Dexamethasone;Moist Heat;Ultrasound;Functional mobility training;Therapeutic activities;Therapeutic exercise;Neuromuscular re-education;Patient/family education;Manual techniques;Passive range of motion;Dry needling;Taping;Vasopneumatic Device    PT Next Visit Plan review HEP; progress with PROM Lt shoulder per protocol (75 deg flexion, 30 deg ER/IR in scapular plane); modalities as indicated    PT Home Exercise Plan [id number]    Consulted and Agree with Plan  of Care Patient             Patient will benefit from skilled therapeutic intervention in order to improve the following deficits and impairments:  Decreased range of motion, Impaired UE functional use, Decreased activity tolerance, Pain, Impaired flexibility, Improper body mechanics, Decreased mobility, Decreased strength, Increased edema, Impaired sensation, Postural dysfunction  Visit Diagnosis: Muscle weakness (generalized)  Chronic left shoulder pain  Other symptoms and signs involving the musculoskeletal system  Abnormal posture     Problem List Patient Active Problem List   Diagnosis Date Noted   Greater trochanteric pain syndrome of left lower extremity 10/20/2019    Ellenora Talton Nilda Simmer, PT, MPH  08/16/2021, 1:06 PM  [facility] [place], [place], [address] Phone: [phone]   Fax:  [phone]  Name: Raymond Walsh MRN: [id number] Date of Birth: 1955-07-24

## 2021-08-20 ENCOUNTER — Other Ambulatory Visit: Payer: Self-pay

## 2021-08-20 ENCOUNTER — Encounter: Payer: Self-pay | Admitting: Physical Therapy

## 2021-08-20 ENCOUNTER — Ambulatory Visit: Payer: Medicare Other | Admitting: Physical Therapy

## 2021-08-20 DIAGNOSIS — M6281 Muscle weakness (generalized): Secondary | ICD-10-CM

## 2021-08-20 DIAGNOSIS — M25512 Pain in left shoulder: Secondary | ICD-10-CM

## 2021-08-20 DIAGNOSIS — G8929 Other chronic pain: Secondary | ICD-10-CM | POA: Diagnosis not present

## 2021-08-20 DIAGNOSIS — R29898 Other symptoms and signs involving the musculoskeletal system: Secondary | ICD-10-CM

## 2021-08-20 DIAGNOSIS — R293 Abnormal posture: Secondary | ICD-10-CM

## 2021-08-20 NOTE — Therapy (Signed)
[place], [place], [address] Phone: [phone]   Fax:  [phone]  Physical Therapy Treatment  Patient Details  Name: Raymond Walsh MRN: [id number] Date of Birth: 1956-05-14 Referring Provider (PT): Dr [name]   Encounter Date: 08/20/2021   PT End of Session - 08/20/21 0944     Visit Number 2    Number of Visits 32    Date for PT Re-Evaluation 12/06/21    PT Start Time 0933    PT Stop Time 1017    PT Time Calculation (min) 44 min    Activity Tolerance Patient tolerated treatment well             Past Medical History:  Diagnosis Date   A-fib [facility])    Arthritis    hands,   Cancer ([place])    prostate   Dysrhythmia 08/2019   A-fib from energy medication    Past Surgical History:  Procedure Laterality Date   BACK SURGERY  2000   plate in G6--K5   TOTAL SHOULDER 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   Subjective Assessment - 08/20/21 0944     Subjective Pt reports he is wearing his sling 24/7.  He tried to sleep without sling one night, "and that didn't last too long".  He ices 3x/ day after completing HEP.    Pertinent History arthritis; Rt TKA    Patient Stated Goals get to use the Lt arm again and be able to work without the shoulder hurting    Currently in Pain? Yes    Pain Score 2     Pain Location Shoulder    Pain Orientation Left    Pain Descriptors / Indicators Sore    Aggravating Factors  moving arm    Pain Relieving Factors ice, meds                [facility] PT Assessment - 08/20/21 0001       Assessment   Medical Diagnosis Lt TSA    Referring Provider (PT) Dr [name]    Onset Date/Surgical Date 08/14/21    Hand Dominance Right    Next MD Visit 08/29/21    Prior Therapy here  for Lt shoulder pain      Precautions   Precautions Shoulder    Type of Shoulder Precautions per protocol      PROM   Left Shoulder Flexion 75 Degrees              [facility] Adult PT Treatment/Exercise - 08/20/21 0001       Elbow Exercises   Elbow Flexion AROM;Left;10 reps   seated     Shoulder Exercises: Seated   External Rotation AAROM;Left;10 reps   LUE propped on pillow   External Rotation Limitations using cane to assist with ER - no ER > 30 deg    Flexion AAROM;Left;10 reps    Flexion Limitations table slide 10 sec hold to no greater than 75    Other Seated Exercises gentle scap squeeze x 5 sec x 10 reps    Other Seated Exercises Lt forearm pronation x 10      Shoulder Exercises: Standing   Other Standing Exercises Lt shoulder pendulum CW/ CCW x 10      Vasopneumatic   Number Minutes  Vasopneumatic  10 minutes    Vasopnuematic Location  Shoulder    Vasopneumatic Pressure Low    Vasopneumatic Temperature  34      Manual Therapy   Passive ROM Lt shoulder flexion to ~ 75 deg; ER to <30 - tissue limits and no pain.                PT Short Term Goals - 08/16/21 1254       PT SHORT TERM GOAL #1   Title Improve posture and alignment with patient to demonstrate improve upright posture with posterior shoulder girdle engaged    Time 8    Period Weeks    Status New    Target Date 10/11/21      PT SHORT TERM GOAL #2   Title Patient to demonstrate AROM Lt shoulder to 90-100 degrees; ER to 25 degrees in scapular plane    Time 8    Period Weeks    Status New    Target Date 10/11/21      PT SHORT TERM GOAL #3   Title Patient to demonstrate 3+/5 to 4/5 strength Lt shoulder in available range    Time 8    Period Weeks    Status New    Target Date 10/11/21               PT Long Term Goals - 08/16/21 1302       PT LONG TERM GOAL #1   Title Patient to demonstrate AROM Lt shoulder to 150-160 deg flexion; 75 degrees ER; 60 degrees IR    Time 16     Period Weeks    Status New    Target Date 12/06/21      PT LONG TERM GOAL #2   Title Increase strength Lt shoulder to 4/5 for ER; IR; abduction    Time 16    Period Weeks    Status New    Target Date 12/06/21      PT LONG TERM GOAL #3   Title Patient to report improved ability to reach overhead functionally    Time 16    Period Weeks    Status New    Target Date 12/06/21      PT LONG TERM GOAL #4   Title Independent in HEP    Time 16    Period Weeks    Status New    Target Date 12/06/21      PT LONG TERM GOAL #5   Title Improve functional limitation score to 57    Time 16    Period Weeks    Status New    Target Date 12/06/21                   Plan - 08/20/21 1113     Clinical Impression Statement Pt arrived holding his shoulder sling under his Lt arm.  Reveiwed precautions and protocol time line.  Pt demonstrated improved Lt shoulder flexion ROM to 75 (reminded pt to keep range <75 per protocol).  Goals are ongoing.    Examination-Activity Limitations Bathing;Bed Mobility;Carry;Dressing;Hygiene/Grooming;Lift;Reach Overhead;Sleep;Toileting    Examination-Participation Restrictions Cleaning;Community Activity;Driving;Meal Prep;Occupation    Stability/Clinical Decision Making Stable/Uncomplicated    Rehab Potential Good    PT Frequency 2x / week    PT Duration Other (comment)   16 weeks   PT Treatment/Interventions ADLs/Self Care Home Management;Aquatic Therapy;Cryotherapy;Electrical Stimulation;Iontophoresis 4mg /ml Dexamethasone;Moist Heat;Ultrasound;Functional mobility training;Therapeutic activities;Therapeutic exercise;Neuromuscular re-education;Patient/family education;Manual techniques;Passive range of motion;Dry needling;Taping;Vasopneumatic Device  PT Next Visit Plan review HEP; progress with PROM Lt shoulder per protocol (75 deg flexion, 30 deg ER/IR in scapular plane); modalities as indicated    PT Home Exercise Plan [email or web address]    Consulted and Agree  with Plan of Care Patient             Patient will benefit from skilled therapeutic intervention in order to improve the following deficits and impairments:  Decreased range of motion, Impaired UE functional use, Decreased activity tolerance, Pain, Impaired flexibility, Improper body mechanics, Decreased mobility, Decreased strength, Increased edema, Impaired sensation, Postural dysfunction  Visit Diagnosis: Muscle weakness (generalized)  Chronic left shoulder pain  Other symptoms and signs involving the musculoskeletal system  Abnormal posture     Problem List Patient Active Problem List   Diagnosis Date Noted   Greater trochanteric pain syndrome of left lower extremity 10/20/2019   Kerin Perna, PTA 08/20/21 11:23 AM  [place], [place], [address] Phone: [phone]   Fax:  [phone]  Name: Raymond Walsh MRN: [id number] Date of Birth: 1956-06-02

## 2021-08-22 ENCOUNTER — Ambulatory Visit: Payer: Medicare Other | Admitting: Rehabilitative and Restorative Service Providers"

## 2021-08-22 ENCOUNTER — Encounter: Payer: Self-pay | Admitting: Rehabilitative and Restorative Service Providers"

## 2021-08-22 ENCOUNTER — Other Ambulatory Visit: Payer: Self-pay

## 2021-08-22 DIAGNOSIS — G8929 Other chronic pain: Secondary | ICD-10-CM | POA: Diagnosis not present

## 2021-08-22 DIAGNOSIS — R293 Abnormal posture: Secondary | ICD-10-CM

## 2021-08-22 DIAGNOSIS — M6281 Muscle weakness (generalized): Secondary | ICD-10-CM

## 2021-08-22 DIAGNOSIS — R29898 Other symptoms and signs involving the musculoskeletal system: Secondary | ICD-10-CM

## 2021-08-22 NOTE — Therapy (Signed)
[name], [place], [address] Phone: [phone]   Fax:  [phone]  Physical Therapy Treatment  Patient Details  Name: Raymond Walsh MRN: [id number] Date of Birth: 1956-03-10 Referring Provider (PT): Dr [name]   Encounter Date: 08/22/2021   PT End of Session - 08/22/21 1349     Visit Number 3    Number of Visits 32    Date for PT Re-Evaluation 12/06/21    PT Start Time 3267    PT Stop Time 1432    PT Time Calculation (min) 47 min    Activity Tolerance Patient tolerated treatment well             Past Medical History:  Diagnosis Date   A-fib [facility])    Arthritis    hands,   Cancer ([place])    prostate   Dysrhythmia 08/2019   A-fib from energy medication    Past Surgical History:  Procedure Laterality Date   BACK SURGERY  2000   plate in T2--W5   TOTAL SHOULDER 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   Subjective Assessment - 08/22/21 1350     Subjective Patient reports that he is doing ok with exercises. He has been in the sling but now has a rash from the heat along his chest and side.    Currently in Pain? No/denies    Pain Score 0-No pain    Pain Location Shoulder                [facility] PT Assessment - 08/22/21 0001       Assessment   Medical Diagnosis Lt TSA    Referring Provider (PT) Dr [name]    Onset Date/Surgical Date 08/14/21    Hand Dominance Right    Next MD Visit 08/29/21    Prior Therapy here for Lt shoulder pain      PROM   Left Shoulder Flexion 75 Degrees    Left Shoulder ABduction 25 Degrees   in scapular plane   Left Shoulder External Rotation 18 Degrees   in scapular plane                          [facility] Adult PT Treatment/Exercise - 08/22/21 0001        Elbow Exercises   Elbow Flexion AROM;Left;5 reps   seated     Shoulder Exercises: Seated   External Rotation AAROM;Left;10 reps   LUE propped on pillow   External Rotation Limitations using cane to assist with ER - no ER > 30 deg    Flexion AAROM;Left;10 reps    Flexion Limitations table slide 10 sec hold to no greater than 75    Other Seated Exercises scap squeeze 5 sec x 10 reps    Other Seated Exercises Lt forearm pronation x 10      Shoulder Exercises: ROM/Strengthening   Pendulum 30 reps fwd/back; circle CW      Vasopneumatic   Number Minutes Vasopneumatic  10 minutes    Vasopnuematic Location  Shoulder    Vasopneumatic Pressure Low    Vasopneumatic Temperature  34      Manual Therapy   Manual therapy comments soft tissue work through the Lt shoulder girdle - pt supine  Passive ROM Lt shoulder flexion to  75 deg; ER to 18 deg all ROM to tissue limits and no pain.                       PT Short Term Goals - 08/16/21 1254       PT SHORT TERM GOAL #1   Title Improve posture and alignment with patient to demonstrate improve upright posture with posterior shoulder girdle engaged    Time 8    Period Weeks    Status New    Target Date 10/11/21      PT SHORT TERM GOAL #2   Title Patient to demonstrate AROM Lt shoulder to 90-100 degrees; ER to 25 degrees in scapular plane    Time 8    Period Weeks    Status New    Target Date 10/11/21      PT SHORT TERM GOAL #3   Title Patient to demonstrate 3+/5 to 4/5 strength Lt shoulder in available range    Time 8    Period Weeks    Status New    Target Date 10/11/21               PT Long Term Goals - 08/16/21 1302       PT LONG TERM GOAL #1   Title Patient to demonstrate AROM Lt shoulder to 150-160 deg flexion; 75 degrees ER; 60 degrees IR    Time 16    Period Weeks    Status New    Target Date 12/06/21      PT LONG TERM GOAL #2   Title Increase strength Lt shoulder to 4/5 for ER; IR;  abduction    Time 16    Period Weeks    Status New    Target Date 12/06/21      PT LONG TERM GOAL #3   Title Patient to report improved ability to reach overhead functionally    Time 16    Period Weeks    Status New    Target Date 12/06/21      PT LONG TERM GOAL #4   Title Independent in HEP    Time 16    Period Weeks    Status New    Target Date 12/06/21      PT LONG TERM GOAL #5   Title Improve functional limitation score to 57    Time 16    Period Weeks    Status New    Target Date 12/06/21                   Plan - 08/22/21 1436     Clinical Impression Statement Continued to reinforce shoulder precautions and protocol. Reviewed HEP. Added manual work and PROM in supine. Note gains in PROM. Tolerated treatment well.             Patient will benefit from skilled therapeutic intervention in order to improve the following deficits and impairments:     Visit Diagnosis: Muscle weakness (generalized)  Chronic left shoulder pain  Other symptoms and signs involving the musculoskeletal system  Abnormal posture     Problem List Patient Active Problem List   Diagnosis Date Noted   Greater trochanteric pain syndrome of left lower extremity 10/20/2019    Raymond Walsh, PT, MPH 08/22/2021, 2:40 PM  [facility] [place], [place], [address] Phone: [phone]   Fax:  [phone]  Name: Raymond Walsh MRN: [id number] Date of Birth: 08/16/1955

## 2021-08-28 ENCOUNTER — Encounter: Payer: Self-pay | Admitting: Rehabilitative and Restorative Service Providers"

## 2021-08-28 ENCOUNTER — Ambulatory Visit: Payer: Medicare Other | Admitting: Rehabilitative and Restorative Service Providers"

## 2021-08-28 ENCOUNTER — Other Ambulatory Visit: Payer: Self-pay

## 2021-08-28 DIAGNOSIS — M6281 Muscle weakness (generalized): Secondary | ICD-10-CM

## 2021-08-28 DIAGNOSIS — R293 Abnormal posture: Secondary | ICD-10-CM

## 2021-08-28 DIAGNOSIS — G8929 Other chronic pain: Secondary | ICD-10-CM

## 2021-08-28 DIAGNOSIS — R29898 Other symptoms and signs involving the musculoskeletal system: Secondary | ICD-10-CM

## 2021-08-28 NOTE — Patient Instructions (Signed)
Access Code: [email or web address] URL: [email or web address] Date: 08/28/2021 Prepared by: Gillermo Murdoch  Exercises Circular Shoulder Pendulum with Table Support - 3-4 x daily - 7 x weekly - 1 sets - 20-30 reps Seated Shoulder Flexion Towel Slide at Table Top Full Range of Motion - 2 x daily - 7 x weekly - 1 sets - 5-10 reps - 10sec hold Isometric Shoulder Abduction at Wall - 2 x daily - 7 x weekly - 1 sets - 5-10 reps - 5 sec hold Isometric Shoulder External Rotation at Wall - 2 x daily - 7 x weekly - 1 sets - 5 reps - 5 sec hold Standing Isometric Shoulder Internal Rotation with Towel Roll at Doorway - 2 x daily - 7 x weekly - 1 sets - 5 reps - 5 sec hold Seated Isometric Elbow Flexion - 2 x daily - 7 x weekly - 1 sets - 5-10 reps - 5 sec hold Seated Shoulder Flexion AAROM with Pulley Behind - 2 x daily - 7 x weekly - 1 sets - 10 reps - 10 sec hold

## 2021-08-28 NOTE — Therapy (Signed)
[place], [place], [address] Phone: [phone]   Fax:  [phone]  Physical Therapy Treatment  Patient Details  Name: Raymond Walsh MRN: [id number] Date of Birth: 19-Nov-1955 Referring Provider (PT): Dr [name]   Encounter Date: 08/28/2021   PT End of Session - 08/28/21 1152     Visit Number 4    Number of Visits 32    Date for PT Re-Evaluation 12/06/21    PT Start Time 1150    PT Stop Time 1240    PT Time Calculation (min) 50 min    Activity Tolerance Patient tolerated treatment well             Past Medical History:  Diagnosis Date   A-fib [facility])    Arthritis    hands,   Cancer ([place])    prostate   Dysrhythmia 08/2019   A-fib from energy medication    Past Surgical History:  Procedure Laterality Date   BACK SURGERY  2000   plate in V6--X4   TOTAL SHOULDER 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   Subjective Assessment - 08/28/21 1152     Subjective Patient reports that he has not felt well the past couple of days. He "got drunk" Saturday night and has not felt well since. Has been working on his exercises at home.    Currently in Pain? No/denies    Pain Score 0-No pain    Pain Location Shoulder    Pain Orientation Left                [facility] PT Assessment - 08/28/21 0001       Assessment   Medical Diagnosis Lt TSA    Referring Provider (PT) Dr [name]    Onset Date/Surgical Date 08/14/21    Hand Dominance Right    Next MD Visit 08/29/21    Prior Therapy here for Lt shoulder pain      PROM   Left Shoulder Flexion 75 Degrees    Left Shoulder ABduction 25 Degrees   in scapular plane   Left Shoulder Internal Rotation 30 Degrees   in 30 deg scapular plane   Left Shoulder External  Rotation 0 Degrees   in 30 deg scapular plane                          [facility] Adult PT Treatment/Exercise - 08/28/21 0001       Elbow Exercises   Elbow Flexion AROM;Left;5 reps   seated     Shoulder Exercises: Seated   External Rotation AAROM;Left;10 reps   LUE propped on pillow   External Rotation Limitations using cane to assist with ER - no ER > 30 deg    Flexion AAROM;Left;10 reps    Flexion Limitations table slide 10 sec hold to no greater than 75    Other Seated Exercises scap squeeze 5 sec x 10 reps    Other Seated Exercises Lt forearm pronation x 10      Shoulder Exercises: Pulleys   Flexion Limitations 10 sec hold x 7 reps      Shoulder Exercises: ROM/Strengthening   Pendulum 30 reps fwd/back; circle CW      Shoulder Exercises: Isometric Strengthening   External Rotation 5X5"  Internal Rotation 5X5"    ABduction 5X5"    Other Isometric Exercises elbow flexion 5 sec x 5 reps      Vasopneumatic   Number Minutes Vasopneumatic  10 minutes    Vasopnuematic Location  Shoulder    Vasopneumatic Pressure Low    Vasopneumatic Temperature  34      Manual Therapy   Manual therapy comments soft tissue work through the Lt shoulder girdle - pt supine    Passive ROM Lt shoulder flexion to  75 deg; ER to 18 deg all ROM to tissue limits and no pain.                     PT Education - 08/28/21 1210     Education Details HEP    Person(s) Educated Patient    Methods Explanation;Demonstration;Tactile cues;Verbal cues;Handout    Comprehension Verbalized understanding;Returned demonstration;Verbal cues required;Tactile cues required              PT Short Term Goals - 08/16/21 1254       PT SHORT TERM GOAL #1   Title Improve posture and alignment with patient to demonstrate improve upright posture with posterior shoulder girdle engaged    Time 8    Period Weeks    Status New    Target Date 10/11/21      PT SHORT TERM GOAL #2   Title  Patient to demonstrate AROM Lt shoulder to 90-100 degrees; ER to 25 degrees in scapular plane    Time 8    Period Weeks    Status New    Target Date 10/11/21      PT SHORT TERM GOAL #3   Title Patient to demonstrate 3+/5 to 4/5 strength Lt shoulder in available range    Time 8    Period Weeks    Status New    Target Date 10/11/21               PT Long Term Goals - 08/16/21 1302       PT LONG TERM GOAL #1   Title Patient to demonstrate AROM Lt shoulder to 150-160 deg flexion; 75 degrees ER; 60 degrees IR    Time 16    Period Weeks    Status New    Target Date 12/06/21      PT LONG TERM GOAL #2   Title Increase strength Lt shoulder to 4/5 for ER; IR; abduction    Time 16    Period Weeks    Status New    Target Date 12/06/21      PT LONG TERM GOAL #3   Title Patient to report improved ability to reach overhead functionally    Time 16    Period Weeks    Status New    Target Date 12/06/21      PT LONG TERM GOAL #4   Title Independent in HEP    Time 16    Period Weeks    Status New    Target Date 12/06/21      PT LONG TERM GOAL #5   Title Improve functional limitation score to 57    Time 16    Period Weeks    Status New    Target Date 12/06/21                   Plan - 08/28/21 1201     Clinical Impression Statement Gradual progress with P/AAROM Lt UE. Added isometric exercise  and pulley flexion.  Progressing well with exercises per protocol.    Rehab Potential Good    PT Frequency 2x / week    PT Duration Other (comment)   16 weeks   PT Treatment/Interventions ADLs/Self Care Home Management;Aquatic Therapy;Cryotherapy;Electrical Stimulation;Iontophoresis 4mg /ml Dexamethasone;Moist Heat;Ultrasound;Functional mobility training;Therapeutic activities;Therapeutic exercise;Neuromuscular re-education;Patient/family education;Manual techniques;Passive range of motion;Dry needling;Taping;Vasopneumatic Device    PT Next Visit Plan review HEP; progress with  PROM Lt shoulder per protocol (75 deg flexion, 30 deg ER/IR in scapular plane); modalities as indicated    PT Home Exercise Plan [id number]    Consulted and Agree with Plan of Care Patient             Patient will benefit from skilled therapeutic intervention in order to improve the following deficits and impairments:     Visit Diagnosis: Muscle weakness (generalized)  Chronic left shoulder pain  Other symptoms and signs involving the musculoskeletal system  Abnormal posture     Problem List Patient Active Problem List   Diagnosis Date Noted   Greater trochanteric pain syndrome of left lower extremity 10/20/2019    Meah Jiron Nilda Simmer, PT, MPH 08/28/2021, 12:32 PM  [facility] [place], [place], [address] Phone: [phone]   Fax:  [phone]  Name: Jerron Niblack MRN: [id number] Date of Birth: 01/09/1956

## 2021-08-30 ENCOUNTER — Other Ambulatory Visit: Payer: Self-pay

## 2021-08-30 ENCOUNTER — Encounter: Payer: Self-pay | Admitting: Rehabilitative and Restorative Service Providers"

## 2021-08-30 ENCOUNTER — Ambulatory Visit: Payer: Medicare Other | Attending: Orthopedic Surgery | Admitting: Rehabilitative and Restorative Service Providers"

## 2021-08-30 DIAGNOSIS — M25561 Pain in right knee: Secondary | ICD-10-CM | POA: Insufficient documentation

## 2021-08-30 DIAGNOSIS — R29898 Other symptoms and signs involving the musculoskeletal system: Secondary | ICD-10-CM | POA: Diagnosis present

## 2021-08-30 DIAGNOSIS — R600 Localized edema: Secondary | ICD-10-CM | POA: Insufficient documentation

## 2021-08-30 DIAGNOSIS — R293 Abnormal posture: Secondary | ICD-10-CM | POA: Insufficient documentation

## 2021-08-30 DIAGNOSIS — M6281 Muscle weakness (generalized): Secondary | ICD-10-CM | POA: Diagnosis present

## 2021-08-30 DIAGNOSIS — M25512 Pain in left shoulder: Secondary | ICD-10-CM | POA: Insufficient documentation

## 2021-08-30 DIAGNOSIS — G8929 Other chronic pain: Secondary | ICD-10-CM | POA: Insufficient documentation

## 2021-08-30 NOTE — Therapy (Signed)
[place] ?[facility] ?[address] ?[place], [place], [address] ?Phone: [phone]   Fax:  [phone] ? ?Physical Therapy Treatment ? ?Patient Details  ?Name: Raymond Walsh ?MRN: [id number] ?Date of Birth: 03/09/1956 ?Referring Provider (PT): Dr [name] ? ? ?Encounter Date: 08/30/2021 ? ? PT End of Session - 08/30/21 1402   ? ? Visit Number 5   ? Number of Visits 32   ? Date for PT Re-Evaluation 12/06/21   ? PT Start Time 1401   ? PT Stop Time 6962   ? PT Time Calculation (min) 44 min   ? Activity Tolerance Patient tolerated treatment well   ? ?  ?  ? ?  ? ? ?Past Medical History:  ?Diagnosis Date  ? A-fib ([place])   ? 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 Cancer [facility])   ? prostate  ? Dysrhythmia 08/2019  ? A-fib from energy medication  ? ? ?Past Surgical History:  ?Procedure Laterality Date  ? BACK SURGERY  2000  ? plate in X5--M8  ? TOTAL SHOULDER ARTHROPLASTY Left 08/14/2021  ? Procedure: LEFT TOTAL SHOULDER ARTHROPLASTY;  Surgeon: [name], MD;  Location: [facility];  Service: Orthopedics;  Laterality: Left;  ? ? ?There were no vitals filed for this visit. ? ? Subjective Assessment - 08/30/21 1402   ? ? Subjective MD was pleased with progress so far. Raymond Walsh will continue in abduction sling for 4 more weeks and continue rehab per protocol.   ? Currently in Pain? No/denies   ? Pain Score 0-No pain   ? ?  ?  ? ?  ? ? ? ? ? ? ? ? ? ? ? ? ? ? ? ? ? ? ? ? [place] Adult PT Treatment/Exercise - 08/30/21 0001   ? ?  ? Shoulder Exercises: Seated  ? External Rotation AAROM;Left;10 reps   LUE propped on pillow  ? External Rotation Limitations using cane to assist with ER - no ER > 30 deg   ? Flexion AAROM;Left;10 reps   ? Flexion Limitations counter step back 10 sec x 5 reps   ? Other Seated Exercises scap squeeze 5 sec x 10 reps   ?  ? Shoulder Exercises: Pulleys  ? Flexion Limitations 10 sec hold x 10 reps   ?  ? Shoulder Exercises: ROM/Strengthening  ? Pendulum 30 reps fwd/back; circle CW    ?  ? Shoulder Exercises: Isometric Strengthening  ? External Rotation 5X5"   ? Internal Rotation 5X5"   ? ABduction 5X5"   ? Other Isometric Exercises elbow flexion 5 sec x 5 reps   ?  ? Cryotherapy  ? Number Minutes Cryotherapy 10 Minutes   ? Cryotherapy Location Shoulder   ? Type of Cryotherapy Ice pack   ?  ? Manual Therapy  ? Manual therapy comments soft tissue work through the Lt shoulder girdle - pt supine   ? Passive ROM Lt shoulder flexion to  75 deg; ER to 18 deg all ROM to tissue limits and no pain.   ? ?  ?  ? ?  ? ? ? ? ? ? ? ? ? ? PT Education - 08/30/21 1426   ? ? Education Details HEP   ? Person(s) Educated Patient   ? Methods Explanation;Demonstration;Tactile cues;Verbal cues;Handout   ? Comprehension Verbalized understanding;Returned demonstration;Verbal cues required;Tactile cues required   ? ?  ?  ? ?  ? ? ? PT Short Term Goals - 08/16/21 1254   ? ?  ?  PT SHORT TERM GOAL #1  ? Title Improve posture and alignment with patient to demonstrate improve upright posture with posterior shoulder girdle engaged   ? Time 8   ? Period Weeks   ? Status New   ? Target Date 10/11/21   ?  ? PT SHORT TERM GOAL #2  ? Title Patient to demonstrate AROM Lt shoulder to 90-100 degrees; ER to 25 degrees in scapular plane   ? Time 8   ? Period Weeks   ? Status New   ? Target Date 10/11/21   ?  ? PT SHORT TERM GOAL #3  ? Title Patient to demonstrate 3+/5 to 4/5 strength Lt shoulder in available range   ? Time 8   ? Period Weeks   ? Status New   ? Target Date 10/11/21   ? ?  ?  ? ?  ? ? ? ? PT Long Term Goals - 08/16/21 1302   ? ?  ? PT LONG TERM GOAL #1  ? Title Patient to demonstrate AROM Lt shoulder to 150-160 deg flexion; 75 degrees ER; 60 degrees IR   ? Time 16   ? Period Weeks   ? Status New   ? Target Date 12/06/21   ?  ? PT LONG TERM GOAL #2  ? Title Increase strength Lt shoulder to 4/5 for ER; IR; abduction   ? Time 16   ? Period Weeks   ? Status New   ? Target Date 12/06/21   ?  ? PT LONG TERM GOAL #3  ? Title  Patient to report improved ability to reach overhead functionally   ? Time 16   ? Period Weeks   ? Status New   ? Target Date 12/06/21   ?  ? PT LONG TERM GOAL #4  ? Title Independent in [place]   ? Time 16   ? Period Weeks   ? Status New   ? Target Date 12/06/21   ?  ? PT LONG TERM GOAL #5  ? Title Improve functional limitation score to 57   ? Time 16   ? Period Weeks   ? Status New   ? Target Date 12/06/21   ? ?  ?  ? ?  ? ? ? ? ? ? ? ? Plan - 08/30/21 1410   ? ? Clinical Impression Statement MD pleased with progress. To continue with rehab per protocol. Good increase in PROM. Tolerated exercises well.   ? Rehab Potential Good   ? PT Frequency 2x / week   ? PT Duration Other (comment)   16 weeks  ? PT Treatment/Interventions ADLs/Self Care Home Management;Aquatic Therapy;Cryotherapy;Electrical Stimulation;Iontophoresis 4mg /ml Dexamethasone;Moist Heat;Ultrasound;Functional mobility training;Therapeutic activities;Therapeutic exercise;Neuromuscular re-education;Patient/family education;Manual techniques;Passive range of motion;Dry needling;Taping;Vasopneumatic Device   ? PT Next Visit Plan review HEP; progress with PROM Lt shoulder per protocol (90-100 deg flexion, 45 deg ER/IR in scapular plane); modalities as indicated   ? [place] [id number]   ? Consulted and Agree with Plan of Care Patient   ? ?  ?  ? ?  ? ? ?Patient will benefit from skilled therapeutic intervention in order to improve the following deficits and impairments:    ? ?Visit Diagnosis: ?Muscle weakness (generalized) ? ?Chronic left shoulder pain ? ?Other symptoms and signs involving the musculoskeletal system ? ?Abnormal posture ? ? ? ? ?Problem List ?Patient Active Problem List  ? Diagnosis Date Noted  ? Greater trochanteric pain syndrome of left lower extremity  10/20/2019  ? ? ?Everardo All, PT, MPH ?08/30/2021, 2:40 PM ? ?[place] ?[facility] ?[address] ?[place], [place], [address] ?Phone:  [phone]   Fax:  [phone] ? ?Name: Raymond Walsh ?MRN: [id number] ?Date of Birth: 17-Mar-1956 ? ? ? ?

## 2021-08-30 NOTE — Patient Instructions (Signed)
Access Code: [email or web address] ?URL: [email or web address] ?Date: 08/30/2021 ?Prepared by: Gillermo Murdoch ? ?Exercises ?Circular Shoulder Pendulum with Table Support - 3-4 x daily - 7 x weekly - 1 sets - 20-30 reps ?Seated Shoulder Flexion Towel Slide at Table Top Full Range of Motion - 2 x daily - 7 x weekly - 1 sets - 5-10 reps - 10sec hold ?Isometric Shoulder Abduction at Wall - 2 x daily - 7 x weekly - 1 sets - 5-10 reps - 5 sec hold ?Isometric Shoulder External Rotation at Wall - 2 x daily - 7 x weekly - 1 sets - 5 reps - 5 sec hold ?Standing Isometric Shoulder Internal Rotation with Towel Roll at Doorway - 2 x daily - 7 x weekly - 1 sets - 5 reps - 5 sec hold ?Seated Isometric Elbow Flexion - 2 x daily - 7 x weekly - 1 sets - 5-10 reps - 5 sec hold ?Seated Shoulder Flexion AAROM with Pulley Behind - 2 x daily - 7 x weekly - 1 sets - 10 reps - 10 sec hold ?Seated Shoulder Scaption AAROM with Pulley at Side - 2 x daily - 7 x weekly - 1 sets - 10 reps - 10sec hold ?Standing Shoulder and Trunk Flexion at Table - 2 x daily - 7 x weekly - 1 sets - 5 reps - 5 sec hold ? ?

## 2021-09-03 ENCOUNTER — Encounter: Payer: Self-pay | Admitting: Physical Therapy

## 2021-09-03 ENCOUNTER — Ambulatory Visit: Payer: Medicare Other | Admitting: Physical Therapy

## 2021-09-03 ENCOUNTER — Other Ambulatory Visit: Payer: Self-pay

## 2021-09-03 DIAGNOSIS — R293 Abnormal posture: Secondary | ICD-10-CM

## 2021-09-03 DIAGNOSIS — R29898 Other symptoms and signs involving the musculoskeletal system: Secondary | ICD-10-CM

## 2021-09-03 DIAGNOSIS — M25512 Pain in left shoulder: Secondary | ICD-10-CM

## 2021-09-03 DIAGNOSIS — M6281 Muscle weakness (generalized): Secondary | ICD-10-CM | POA: Diagnosis not present

## 2021-09-03 DIAGNOSIS — G8929 Other chronic pain: Secondary | ICD-10-CM

## 2021-09-03 NOTE — Patient Instructions (Signed)
Access Code: [email or web address] ?URL: [email or web address] ?Date: 09/03/2021 ?Prepared by: Almyra Free ? ?Exercises ?Circular Shoulder Pendulum with Table Support - 3-4 x daily - 7 x weekly - 1 sets - 20-30 reps ?Seated Shoulder Flexion Towel Slide at Table Top Full Range of Motion - 2 x daily - 7 x weekly - 1 sets - 5-10 reps - 10sec hold ?Isometric Shoulder Abduction at Wall - 2 x daily - 7 x weekly - 1 sets - 5-10 reps - 5 sec hold ?Isometric Shoulder External Rotation at Wall - 2 x daily - 7 x weekly - 1 sets - 5 reps - 5 sec hold ?Standing Isometric Shoulder Internal Rotation with Towel Roll at Doorway - 2 x daily - 7 x weekly - 1 sets - 5 reps - 5 sec hold ?Seated Isometric Elbow Flexion - 2 x daily - 7 x weekly - 1 sets - 5-10 reps - 5 sec hold ?Seated Shoulder Flexion AAROM with Pulley Behind - 2 x daily - 7 x weekly - 1 sets - 10 reps - 10 sec hold ?Seated Shoulder Scaption AAROM with Pulley at Side - 2 x daily - 7 x weekly - 1 sets - 10 reps - 10sec hold ?Standing Shoulder and Trunk Flexion at Table - 2 x daily - 7 x weekly - 1 sets - 5 reps - 5 sec hold ?Seated Levator Scapulae Stretch - 2 x daily - 7 x weekly - 3 reps - 30 hold ?Seated Upper Trapezius Stretch - 2 x daily - 7 x weekly - 3 reps - 30 hold ? ?

## 2021-09-03 NOTE — Therapy (Signed)
[place] ?[facility] ?[address] ?[place], [place], [address] ?Phone: [phone]   Fax:  [phone] ? ?Physical Therapy Treatment ? ?Patient Details  ?Name: Raymond Walsh ?MRN: [id number] ?Date of Birth: Dec 04, 1955 ?Referring Provider (PT): Dr [name] ? ? ?Encounter Date: 09/03/2021 ? ? PT End of Session - 09/03/21 1350   ? ? Visit Number 6   ? Number of Visits 32   ? Date for PT Re-Evaluation 12/06/21   ? PT Start Time 4888   ? PT Stop Time 9169   ? PT Time Calculation (min) 48 min   ? Activity Tolerance Patient tolerated treatment well   ? Behavior During Therapy [facility] for tasks assessed/performed   ? ?  ?  ? ?  ? ? ?Past Medical History:  ?Diagnosis Date  ? A-fib ([place])   ? 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 Cancer [facility])   ? prostate  ? Dysrhythmia 08/2019  ? A-fib from energy medication  ? ? ?Past Surgical History:  ?Procedure Laterality Date  ? BACK SURGERY  2000  ? plate in I5--W3  ? TOTAL SHOULDER ARTHROPLASTY Left 08/14/2021  ? Procedure: LEFT TOTAL SHOULDER ARTHROPLASTY;  Surgeon: [name], MD;  [facility]: [facility];  Service: Orthopedics;  Laterality: Left;  ? ? ?There were no vitals filed for this visit. ? ? Subjective Assessment - 09/03/21 1546   ? ? Subjective No new complaints. No pain reported.   ? Patient Stated Goals get to use the Lt arm again and be able to work without the shoulder hurting   ? Currently in Pain? No/denies   ? ?  ?  ? ?  ? ? ? ? ? ? ? ? ? ? ? ? ? ? ? ? ? ? ? ? [facility] Adult PT Treatment/Exercise - 09/03/21 0001   ? ?  ? Exercises  ? Exercises Shoulder   ?  ? Shoulder Exercises: Supine  ? External Rotation AAROM;Left;10 reps   ? External Rotation Limitations using cane to assist with ER - no ER > 30 deg; arm supported by towel and in armpit   ?  ? Shoulder Exercises: Seated  ? External Rotation --   LUE propped on pillow  ? Flexion AAROM;Left;10 reps   ? Flexion Limitations counter step back 10 sec x 5 reps   ?  ? Shoulder Exercises: Pulleys   ? Flexion Limitations 10 sec hold x 10 reps   ?  ? Shoulder Exercises: ROM/Strengthening  ? Pendulum 30 reps circle CW   ?  ? Shoulder Exercises: Isometric Strengthening  ? External Rotation 5X10"   ? Internal Rotation 5X10"   ? ABduction 5X10"   ? Other Isometric Exercises elbow flexion 10 sec x 5 reps   ?  ? Shoulder Exercises: Stretch  ? Other Shoulder Stretches neck stretches: UT and LS done once in supine x 30 sec each; then in sitting 1x30 sec each.   ?  ? Cryotherapy  ? Number Minutes Cryotherapy 10 Minutes   ? Cryotherapy Location Shoulder   ? Type of Cryotherapy Ice pack   ?  ? Manual Therapy  ? Manual therapy comments TPR to left UT which spasmed during seated ER   ? Passive ROM Lt shoulder flexion to  75 deg; ER to 18 deg all ROM to tissue limits and no pain.   ? ?  ?  ? ?  ? ? ? ? ? ? ? ? ? ? PT Education -  09/03/21 1428   ? ? Education Details HEP   ? Person(s) Educated Patient   ? Methods Explanation;Demonstration;Handout   ? Comprehension Verbalized understanding;Returned demonstration   ? ?  ?  ? ?  ? ? ? PT Short Term Goals - 08/16/21 1254   ? ?  ? PT SHORT TERM GOAL #1  ? Title Improve posture and alignment with patient to demonstrate improve upright posture with posterior shoulder girdle engaged   ? Time 8   ? Period Weeks   ? Status New   ? Target Date 10/11/21   ?  ? PT SHORT TERM GOAL #2  ? Title Patient to demonstrate AROM Lt shoulder to 90-100 degrees; ER to 25 degrees in scapular plane   ? Time 8   ? Period Weeks   ? Status New   ? Target Date 10/11/21   ?  ? PT SHORT TERM GOAL #3  ? Title Patient to demonstrate 3+/5 to 4/5 strength Lt shoulder in available range   ? Time 8   ? Period Weeks   ? Status New   ? Target Date 10/11/21   ? ?  ?  ? ?  ? ? ? ? PT Long Term Goals - 08/16/21 1302   ? ?  ? PT LONG TERM GOAL #1  ? Title Patient to demonstrate AROM Lt shoulder to 150-160 deg flexion; 75 degrees ER; 60 degrees IR   ? Time 16   ? Period Weeks   ? Status New   ? Target Date 12/06/21   ?  ?  PT LONG TERM GOAL #2  ? Title Increase strength Lt shoulder to 4/5 for ER; IR; abduction   ? Time 16   ? Period Weeks   ? Status New   ? Target Date 12/06/21   ?  ? PT LONG TERM GOAL #3  ? Title Patient to report improved ability to reach overhead functionally   ? Time 16   ? Period Weeks   ? Status New   ? Target Date 12/06/21   ?  ? PT LONG TERM GOAL #4  ? Title Independent in [place]   ? Time 16   ? Period Weeks   ? Status New   ? Target Date 12/06/21   ?  ? PT LONG TERM GOAL #5  ? Title Improve functional limitation score to 57   ? Time 16   ? Period Weeks   ? Status New   ? Target Date 12/06/21   ? ?  ?  ? ?  ? ? ? ? ? ? ? ? Plan - 09/03/21 1541   ? ? Clinical Impression Statement Patient on track with protocol. Tends to spasm with seated ER, so we did in supine today. Neck stretches also issued.   ? PT Frequency 2x / week   ? PT Duration Other (comment)   ? PT Treatment/Interventions ADLs/Self Care Home Management;Aquatic Therapy;Cryotherapy;Electrical Stimulation;Iontophoresis '4mg'$ /ml Dexamethasone;Moist Heat;Ultrasound;Functional mobility training;Therapeutic activities;Therapeutic exercise;Neuromuscular re-education;Patient/family education;Manual techniques;Passive range of motion;Dry needling;Taping;Vasopneumatic Device   ? PT Next Visit Plan review neck stretches; progress with PROM Lt shoulder per protocol (90-100 deg flexion, 45 deg ER/IR in scapular plane); modalities as indicated   ? [place] [email or web address]   ? Consulted and Agree with Plan of Care Patient   ? ?  ?  ? ?  ? ? ?Patient will benefit from skilled therapeutic intervention in order to improve the following deficits and impairments:  Decreased  range of motion, Impaired UE functional use, Decreased activity tolerance, Pain, Impaired flexibility, Improper body mechanics, Decreased mobility, Decreased strength, Increased edema, Impaired sensation, Postural dysfunction ? ?Visit Diagnosis: ?Muscle weakness (generalized) ? ?Chronic left  shoulder pain ? ?Other symptoms and signs involving the musculoskeletal system ? ?Abnormal posture ? ? ? ? ?Problem List ?Patient Active Problem List  ? Diagnosis Date Noted  ? Greater trochanteric pain syndrome of left lower extremity 10/20/2019  ? ? ?Madelyn Flavors, PT ?09/03/2021, 3:48 PM ? ?[place] ?[facility] ?[address] ?[place], [place], [address] ?Phone: [phone]   Fax:  [phone] ? ?Name: Raymond Walsh ?MRN: [id number] ?Date of Birth: 05-13-1956 ? ? ? ?

## 2021-09-06 ENCOUNTER — Other Ambulatory Visit: Payer: Self-pay

## 2021-09-06 ENCOUNTER — Ambulatory Visit: Payer: Medicare Other | Admitting: Rehabilitative and Restorative Service Providers"

## 2021-09-06 ENCOUNTER — Encounter: Payer: Self-pay | Admitting: Rehabilitative and Restorative Service Providers"

## 2021-09-06 DIAGNOSIS — M6281 Muscle weakness (generalized): Secondary | ICD-10-CM | POA: Diagnosis not present

## 2021-09-06 DIAGNOSIS — R293 Abnormal posture: Secondary | ICD-10-CM

## 2021-09-06 DIAGNOSIS — M25512 Pain in left shoulder: Secondary | ICD-10-CM

## 2021-09-06 DIAGNOSIS — R29898 Other symptoms and signs involving the musculoskeletal system: Secondary | ICD-10-CM

## 2021-09-06 NOTE — Therapy (Signed)
[place] ?[facility] ?[place] ?[place], [place], [address] ?Phone: [phone]   Fax:  [phone] ? ?Physical Therapy Treatment ? ?Patient Details  ?Name: Raymond Walsh ?MRN: [id number] ?Date of Birth: [birth year] ?Referring Provider (PT): Dr [name] ? ? ?Encounter Date: 09/06/2021 ? ? PT End of Session - 09/06/21 1406   ? ? Visit Number 7   ? Number of Visits 32   ? Date for PT Re-Evaluation 12/06/21   ? PT Start Time 2482   ? PT Stop Time 5003   ? PT Time Calculation (min) 49 min   ? Activity Tolerance Patient tolerated treatment well   ? ?  ?  ? ?  ? ? ?Past Medical History:  ?Diagnosis Date  ? A-fib ([place])   ? 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 Cancer [facility])   ? prostate  ? Dysrhythmia 08/2019  ? A-fib from energy medication  ? ? ?Past Surgical History:  ?Procedure Laterality Date  ? BACK SURGERY  2000  ? plate in B0--W8  ? TOTAL SHOULDER ARTHROPLASTY Left 08/14/2021  ? Procedure: LEFT TOTAL SHOULDER ARTHROPLASTY;  Surgeon: [name], MD;  Location: [facility];  Service: Orthopedics;  Laterality: Left;  ? ? ?There were no vitals filed for this visit. ? ? Subjective Assessment - 09/06/21 1408   ? ? Subjective Has left the sling off because his wife can't put it on and he can't put it on by himself. Still having the most trouble with the one pushing his arm out to the side.   ? Currently in Pain? No/denies   ? Pain Score 0-No pain   ? ?  ?  ? ?  ? ? ? ? ? ? ? ? ? ? ? ? ? ? ? ? ? ? ? ? [facility] Adult PT Treatment/Exercise - 09/06/21 0001   ? ?  ? Shoulder Exercises: Seated  ? External Rotation AAROM;Left;10 reps   ? External Rotation Limitations using cane to assist with ER; towel at side - no ER > 30 deg   ? Flexion AAROM;Left;10 reps   ? Flexion Limitations counter step back 10 sec x 5 reps   ? Other Seated Exercises scap squeeze 5 sec x 10 reps   ?  ? Shoulder Exercises: Pulleys  ? Flexion Limitations 10 sec hold x 10 reps   ?  ? Shoulder Exercises: ROM/Strengthening  ?  Pendulum 30 reps circle CW   ?  ? Shoulder Exercises: Isometric Strengthening  ? External Rotation 5X10"   ? Internal Rotation 5X10"   ? ABduction 5X10"   ? Other Isometric Exercises elbow flexion 10 sec x 5 reps   ?  ? Shoulder Exercises: Stretch  ? Other Shoulder Stretches upper trap stretch bilat 20 sec x 3 reps; chin tuck 10 sec x 5 reps   ?  ? Cryotherapy  ? Number Minutes Cryotherapy 10 Minutes   ? Cryotherapy Location Shoulder   ? Type of Cryotherapy Ice pack   ?  ? Manual Therapy  ? Manual therapy comments TPR to left UT which spasmed during seated ER   ? Passive ROM Lt shoulder flexion to  75 deg; ER to 18 deg all ROM to tissue limits and no pain.   ? ?  ?  ? ?  ? ? ? ? ? ? ? ? ? ? ? ? PT Short Term Goals - 08/16/21 1254   ? ?  ? PT SHORT TERM GOAL #1  ?  Title Improve posture and alignment with patient to demonstrate improve upright posture with posterior shoulder girdle engaged   ? Time 8   ? Period Weeks   ? Status New   ? Target Date 10/11/21   ?  ? PT SHORT TERM GOAL #2  ? Title Patient to demonstrate AROM Lt shoulder to 90-100 degrees; ER to 25 degrees in scapular plane   ? Time 8   ? Period Weeks   ? Status New   ? Target Date 10/11/21   ?  ? PT SHORT TERM GOAL #3  ? Title Patient to demonstrate 3+/5 to 4/5 strength Lt shoulder in available range   ? Time 8   ? Period Weeks   ? Status New   ? Target Date 10/11/21   ? ?  ?  ? ?  ? ? ? ? PT Long Term Goals - 08/16/21 1302   ? ?  ? PT LONG TERM GOAL #1  ? Title Patient to demonstrate AROM Lt shoulder to 150-160 deg flexion; 75 degrees ER; 60 degrees IR   ? Time 16   ? Period Weeks   ? Status New   ? Target Date 12/06/21   ?  ? PT LONG TERM GOAL #2  ? Title Increase strength Lt shoulder to 4/5 for ER; IR; abduction   ? Time 16   ? Period Weeks   ? Status New   ? Target Date 12/06/21   ?  ? PT LONG TERM GOAL #3  ? Title Patient to report improved ability to reach overhead functionally   ? Time 16   ? Period Weeks   ? Status New   ? Target Date 12/06/21   ?   ? PT LONG TERM GOAL #4  ? Title Independent in [place]   ? Time 16   ? Period Weeks   ? Status New   ? Target Date 12/06/21   ?  ? PT LONG TERM GOAL #5  ? Title Improve functional limitation score to 57   ? Time 16   ? Period Weeks   ? Status New   ? Target Date 12/06/21   ? ?  ?  ? ?  ? ? ? ? ? ? ? ? Plan - 09/06/21 1409   ? ? Clinical Impression Statement Patient has discontinued his sling for part of his day. He is working on exercises and P/AAROM continues to gradually improve. Patient is working on posterior shoulder girdle strengthening in standing and sitting. Progressing well per protocol.   ? Rehab Potential Good   ? PT Frequency 2x / week   ? PT Duration Other (comment)   16 weeks  ? PT Treatment/Interventions ADLs/Self Care Home Management;Aquatic Therapy;Cryotherapy;Electrical Stimulation;Iontophoresis '4mg'$ /ml Dexamethasone;Moist Heat;Ultrasound;Functional mobility training;Therapeutic activities;Therapeutic exercise;Neuromuscular re-education;Patient/family education;Manual techniques;Passive range of motion;Dry needling;Taping;Vasopneumatic Device   ? PT Next Visit Plan progress with PROM Lt shoulder per protocol (90-100 deg flexion, 45 deg ER/IR in scapular plane); modalities as indicated   ? [place] [id number]   ? Consulted and Agree with Plan of Care Patient   ? ?  ?  ? ?  ? ? ?Patient will benefit from skilled therapeutic intervention in order to improve the following deficits and impairments:    ? ?Visit Diagnosis: ?Muscle weakness (generalized) ? ?Chronic left shoulder pain ? ?Other symptoms and signs involving the musculoskeletal system ? ?Abnormal posture ? ? ? ? ?Problem List ?Patient Active Problem List  ? Diagnosis Date  Noted  ? Greater trochanteric pain syndrome of left lower extremity 10/20/2019  ? ? ?Everardo All, PT, MPH ?09/06/2021, 2:44 PM ? ?[place] ?[facility] ?[address] ?[place], [place], [address] ?Phone: [phone]   Fax:   [phone] ? ?Name: Raymond Walsh ?MRN: [id number] ?Date of Birth: 1955-10-26 ? ? ? ?

## 2021-09-10 ENCOUNTER — Ambulatory Visit: Payer: Medicare Other | Admitting: Rehabilitative and Restorative Service Providers"

## 2021-09-10 ENCOUNTER — Encounter: Payer: Self-pay | Admitting: Rehabilitative and Restorative Service Providers"

## 2021-09-10 ENCOUNTER — Other Ambulatory Visit: Payer: Self-pay

## 2021-09-10 DIAGNOSIS — R293 Abnormal posture: Secondary | ICD-10-CM

## 2021-09-10 DIAGNOSIS — M6281 Muscle weakness (generalized): Secondary | ICD-10-CM

## 2021-09-10 DIAGNOSIS — G8929 Other chronic pain: Secondary | ICD-10-CM

## 2021-09-10 DIAGNOSIS — R29898 Other symptoms and signs involving the musculoskeletal system: Secondary | ICD-10-CM

## 2021-09-10 DIAGNOSIS — M25512 Pain in left shoulder: Secondary | ICD-10-CM

## 2021-09-10 NOTE — Therapy (Signed)
[place] ?[facility] ?[place] ?[place], [place], [address] ?Phone: [phone]   Fax:  [phone] ? ?Physical Therapy Treatment ? ?Patient Details  ?Name: Raymond Walsh ?MRN: [id number] ?Date of Birth: [birth year] ?Referring Provider (PT): Dr [name] ? ? ?Encounter Date: 09/10/2021 ? ? PT End of Session - 09/10/21 1614   ? ? Visit Number 8   ? Number of Visits 32   ? Date for PT Re-Evaluation 12/06/21   ? PT Start Time 1348   ? PT Stop Time 1430   ? PT Time Calculation (min) 42 min   ? Activity Tolerance Patient tolerated treatment well   ? ?  ?  ? ?  ? ? ?Past Medical History:  ?Diagnosis Date  ? A-fib ([place])   ? 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 Cancer [facility])   ? prostate  ? Dysrhythmia 08/2019  ? A-fib from energy medication  ? ? ?Past Surgical History:  ?Procedure Laterality Date  ? BACK SURGERY  2000  ? plate in O9--G2  ? TOTAL SHOULDER ARTHROPLASTY Left 08/14/2021  ? Procedure: LEFT TOTAL SHOULDER ARTHROPLASTY;  Surgeon: [name], MD;  Location: [facility];  Service: Orthopedics;  Laterality: Left;  ? ? ?There were no vitals filed for this visit. ? ? Subjective Assessment - 09/10/21 1354   ? ? Subjective The patient reports no pain, but gets some soreness in the L arm intermittently at top of the shoulder (anterior deltoid).   ? Pertinent History arthritis; Rt TKA   ? Patient Stated Goals get to use the Lt arm again and be able to work without the shoulder hurting   ? Currently in Pain? No/denies   ? Pain Score 0-No pain   ? Pain Location Shoulder   ? Pain Orientation Left   ? Pain Descriptors / Indicators Sore   ? Pain Type Surgical pain   ? Pain Onset More than a month ago   ? Pain Frequency Constant   ? Aggravating Factors  moving arm   ? Pain Relieving Factors ice, meds   ? ?  ?  ? ?  ? ? ? ? ? [facility] PT Assessment - 09/10/21 1355   ? ?  ? Assessment  ? Medical Diagnosis Lt TSA   ? Referring Provider (PT) Dr [name]   ? Onset Date/Surgical Date 08/14/21    ? Hand Dominance Right   ? Next MD Visit 2 more weeks   ? ?  ?  ? ?  ? ? ? ? ? ? ? ? ? ? ? ? ? ? ? ? [facility] Adult PT Treatment/Exercise - 09/10/21 1356   ? ?  ? Exercises  ? Exercises Shoulder   ?  ? Shoulder Exercises: Supine  ? External Rotation AAROM;Left;10 reps   ? External Rotation Limitations using cane to assist with ER - no ER > 30 deg; arm supported by towel in scapular plane   ? Internal Rotation AAROM;Left;10 reps   ? Internal Rotation Limitations arm supported by towel in scapular plane   ? Flexion PROM;AAROM;10 reps   ? Flexion Limitations scaption   ?  ? Shoulder Exercises: Sidelying  ? External Rotation AAROM;Left;10 reps   ? Flexion AAROM;Left;10 reps   ? Other Sidelying Exercises scapular retraction sidelying x 10 reps   ?  ? Shoulder Exercises: Standing  ? Flexion AAROM   ? Flexion Limitations on ball x 10 reps   ? Other Standing Exercises rolling a  ball anteriorly for flexion AAROM x 10 reps (measured to 115 degrees)   ?  ? Shoulder Exercises: Pulleys  ? Flexion Limitations 10 second holds x 10 reps   ?  ? Shoulder Exercises: ROM/Strengthening  ? Pendulum 20 reps circle CW/CCW   ?  ? Shoulder Exercises: Isometric Strengthening  ? External Rotation 3X5"   ? Internal Rotation 3X5"   ? ABduction 3X5"   ? Other Isometric Exercises did isometrics supine and then standing 3 x 5" for ER (standing)   ?  ? Manual Therapy  ? Manual therapy comments scar tissue massage on L anterior shoulder;   ? Passive ROM L shoulder flexion to 100 degrees, ER to 20 degrees in plane of scapula   ? ?  ?  ? ?  ? ? ? ? ? ? ? ? ? ? ? ? PT Short Term Goals - 08/16/21 1254   ? ?  ? PT SHORT TERM GOAL #1  ? Title Improve posture and alignment with patient to demonstrate improve upright posture with posterior shoulder girdle engaged   ? Time 8   ? Period Weeks   ? Status New   ? Target Date 10/11/21   ?  ? PT SHORT TERM GOAL #2  ? Title Patient to demonstrate AROM Lt shoulder to 90-100 degrees; ER to 25 degrees in scapular plane    ? Time 8   ? Period Weeks   ? Status New   ? Target Date 10/11/21   ?  ? PT SHORT TERM GOAL #3  ? Title Patient to demonstrate 3+/5 to 4/5 strength Lt shoulder in available range   ? Time 8   ? Period Weeks   ? Status New   ? Target Date 10/11/21   ? ?  ?  ? ?  ? ? ? ? PT Long Term Goals - 08/16/21 1302   ? ?  ? PT LONG TERM GOAL #1  ? Title Patient to demonstrate AROM Lt shoulder to 150-160 deg flexion; 75 degrees ER; 60 degrees IR   ? Time 16   ? Period Weeks   ? Status New   ? Target Date 12/06/21   ?  ? PT LONG TERM GOAL #2  ? Title Increase strength Lt shoulder to 4/5 for ER; IR; abduction   ? Time 16   ? Period Weeks   ? Status New   ? Target Date 12/06/21   ?  ? PT LONG TERM GOAL #3  ? Title Patient to report improved ability to reach overhead functionally   ? Time 16   ? Period Weeks   ? Status New   ? Target Date 12/06/21   ?  ? PT LONG TERM GOAL #4  ? Title Independent in [place]   ? Time 16   ? Period Weeks   ? Status New   ? Target Date 12/06/21   ?  ? PT LONG TERM GOAL #5  ? Title Improve functional limitation score to 57   ? Time 16   ? Period Weeks   ? Status New   ? Target Date 12/06/21   ? ?  ?  ? ?  ? ? ? ? ? ? ? ? Plan - 09/10/21 1614   ? ? Clinical Impression Statement The patient is improving with ROM and tolerating isometrics well.  Patient is transitioning in protocol next visit to > 4 weeks (on 09/11/21) allowing further progression as patient tolerates.   ?  Examination-Activity Limitations Bathing;Bed Mobility;Carry;Dressing;Hygiene/Grooming;Lift;Reach Overhead;Sleep;Toileting   ? Examination-Participation Restrictions Cleaning;Community Activity;Driving;Meal Prep;Occupation   ? PT Frequency 2x / week   ? PT Duration Other (comment)   ? PT Treatment/Interventions ADLs/Self Care Home Management;Aquatic Therapy;Cryotherapy;Electrical Stimulation;Iontophoresis '4mg'$ /ml Dexamethasone;Moist Heat;Ultrasound;Functional mobility training;Therapeutic activities;Therapeutic exercise;Neuromuscular  re-education;Patient/family education;Manual techniques;Passive range of motion;Dry needling;Taping;Vasopneumatic Device   ? PT Next Visit Plan progress with PROM Lt shoulder per protocol (90-100 deg flexion, 45 deg ER/IR in scapular plane); modalities as indicated   ? [place] [id number]   ? Consulted and Agree with Plan of Care Patient   ? ?  ?  ? ?  ? ? ?Patient will benefit from skilled therapeutic intervention in order to improve the following deficits and impairments:  Decreased range of motion, Impaired UE functional use, Decreased activity tolerance, Pain, Impaired flexibility, Improper body mechanics, Decreased mobility, Decreased strength, Increased edema, Impaired sensation, Postural dysfunction ? ?Visit Diagnosis: ?Muscle weakness (generalized) ? ?Chronic left shoulder pain ? ?Other symptoms and signs involving the musculoskeletal system ? ?Abnormal posture ? ? ? ? ?Problem List ?Patient Active Problem List  ? Diagnosis Date Noted  ? Greater trochanteric pain syndrome of left lower extremity 10/20/2019  ? ? ?Raymond Walsh, [place] ?09/10/2021, 4:19 PM ? ?[place] ?[facility] ?[place] ?[place], [place], [address] ?Phone: [phone]   Fax:  [phone] ? ?Name: Raymond Walsh ?MRN: [id number] ?Date of Birth: 01-23-1956 ? ? ? ?

## 2021-09-13 ENCOUNTER — Other Ambulatory Visit: Payer: Self-pay

## 2021-09-13 ENCOUNTER — Encounter: Payer: Self-pay | Admitting: Rehabilitative and Restorative Service Providers"

## 2021-09-13 ENCOUNTER — Ambulatory Visit: Payer: Medicare Other | Admitting: Rehabilitative and Restorative Service Providers"

## 2021-09-13 DIAGNOSIS — M6281 Muscle weakness (generalized): Secondary | ICD-10-CM | POA: Diagnosis not present

## 2021-09-13 NOTE — Therapy (Signed)
[place] [facility] ?[place] ?[place], [place], [address] ?Phone: [phone]   Fax:  [phone] ? ?Physical Therapy Treatment ? ?Patient Details  ?Name: Raymond Walsh ?MRN: [id number] ?Date of Birth: 1956/05/21 ?Referring Provider (PT): Dr [name] ? ? ?Encounter Date: 09/13/2021 ? ? PT End of Session - 09/13/21 1405   ? ? Visit Number 9   ? Number of Visits 32   ? Date for PT Re-Evaluation 12/06/21   ? PT Start Time 1402   ? PT Stop Time 1450   ? PT Time Calculation (min) 48 min   ? Activity Tolerance Patient tolerated treatment well   ? ?  ?  ? ?  ? ? ?Past Medical History:  ?Diagnosis Date  ? A-fib ([place])   ? 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 Cancer [facility])   ? prostate  ? Dysrhythmia 08/2019  ? A-fib from energy medication  ? ? ?Past Surgical History:  ?Procedure Laterality Date  ? BACK SURGERY  2000  ? plate in L7--L8  ? TOTAL SHOULDER ARTHROPLASTY Left 08/14/2021  ? Procedure: LEFT TOTAL SHOULDER ARTHROPLASTY;  Surgeon: [name], MD;  Location: [facility];  Service: Orthopedics;  Laterality: Left;  ? ? ?There were no vitals filed for this visit. ? ? Subjective Assessment - 09/13/21 1406   ? ? Subjective The patient reports no pain. Working on his exercises at home. Surprised that he has lost so much muscule in his arm. Has some soreness in the L arm intermittently at top of the shoulder (anterior deltoid).   ? Pain Score 0-No pain   ? ?  ?  ? ?  ? ? ? ? ? ? ? ? ? ? ? ? ? ? ? ? ? ? ? ? [facility] Adult PT Treatment/Exercise - 09/13/21 0001   ? ?  ? Shoulder Exercises: Supine  ? Flexion AAROM;Left;10 reps   ? Flexion Limitations with cane   ?  ? Shoulder Exercises: Seated  ? Flexion AAROM;Left;5 reps   ? Flexion Limitations counter step back 10 sec x 5 reps   ? Other Seated Exercises scap squeeze 5 sec x 10 reps   ?  ? Shoulder Exercises: Standing  ? Flexion AAROM   ? Flexion Limitations on ball x 10 reps   ? Other Standing Exercises rolling a ball anteriorly for  flexion AAROM x 10 reps (measured to 115 degrees)   ?  ? Shoulder Exercises: Pulleys  ? Flexion Limitations 10 second holds x 10 reps   ?  ? Shoulder Exercises: ROM/Strengthening  ? Pendulum 20 reps circle CW/CCW   ? Rhythmic Stabilization, Supine flex/ext; horizontal ab/add; circles CW/CCW x 10 reps each   ?  ? Shoulder Exercises: Isometric Strengthening  ? External Rotation 5X5"   ? Internal Rotation 5X5"   ? ABduction 5X5"   ? Other Isometric Exercises elbow flexioin 10 sec hold x 5 reps   ?  ? Shoulder Exercises: Stretch  ? External Rotation Stretch 5 reps;10 seconds   standing towel under arm using cane  ? Other Shoulder Stretches upper trap stretch bilat 20 sec x 3 reps; chin tuck 10 sec x 5 reps   ?  ? Vasopneumatic  ? Number Minutes Vasopneumatic  10 minutes   ? Vasopnuematic Location  Shoulder   ? Vasopneumatic Pressure Low   ? Vasopneumatic Temperature  34?   ?  ? Manual Therapy  ? Manual therapy comments scar tissue massage on L  anterior shoulder;   ? Passive ROM L shoulder flexion to 100 degrees, ER to 25 degrees in plane of scapula   ? ?  ?  ? ?  ? ? ? ? ? ? ? ? ? ? ? ? PT Short Term Goals - 08/16/21 1254   ? ?  ? PT SHORT TERM GOAL #1  ? Title Improve posture and alignment with patient to demonstrate improve upright posture with posterior shoulder girdle engaged   ? Time 8   ? Period Weeks   ? Status New   ? Target Date 10/11/21   ?  ? PT SHORT TERM GOAL #2  ? Title Patient to demonstrate AROM Lt shoulder to 90-100 degrees; ER to 25 degrees in scapular plane   ? Time 8   ? Period Weeks   ? Status New   ? Target Date 10/11/21   ?  ? PT SHORT TERM GOAL #3  ? Title Patient to demonstrate 3+/5 to 4/5 strength Lt shoulder in available range   ? Time 8   ? Period Weeks   ? Status New   ? Target Date 10/11/21   ? ?  ?  ? ?  ? ? ? ? PT Long Term Goals - 08/16/21 1302   ? ?  ? PT LONG TERM GOAL #1  ? Title Patient to demonstrate AROM Lt shoulder to 150-160 deg flexion; 75 degrees ER; 60 degrees IR   ? Time 16    ? Period Weeks   ? Status New   ? Target Date 12/06/21   ?  ? PT LONG TERM GOAL #2  ? Title Increase strength Lt shoulder to 4/5 for ER; IR; abduction   ? Time 16   ? Period Weeks   ? Status New   ? Target Date 12/06/21   ?  ? PT LONG TERM GOAL #3  ? Title Patient to report improved ability to reach overhead functionally   ? Time 16   ? Period Weeks   ? Status New   ? Target Date 12/06/21   ?  ? PT LONG TERM GOAL #4  ? Title Independent in [place]   ? Time 16   ? Period Weeks   ? Status New   ? Target Date 12/06/21   ?  ? PT LONG TERM GOAL #5  ? Title Improve functional limitation score to 57   ? Time 16   ? Period Weeks   ? Status New   ? Target Date 12/06/21   ? ?  ?  ? ?  ? ? ? ? ? ? ? ? Plan - 09/13/21 1421   ? ? Clinical Impression Statement Continued progress within limits of protocol. Can progress to resistive bands at 5 weeks.   ? Rehab Potential Good   ? PT Frequency 2x / week   ? PT Duration 6 weeks   ? PT Treatment/Interventions ADLs/Self Care Home Management;Aquatic Therapy;Cryotherapy;Electrical Stimulation;Iontophoresis '4mg'$ /ml Dexamethasone;Moist Heat;Ultrasound;Functional mobility training;Therapeutic activities;Therapeutic exercise;Neuromuscular re-education;Patient/family education;Manual techniques;Passive range of motion;Dry needling;Taping;Vasopneumatic Device   ? PT Next Visit Plan progress with PROM Lt shoulder per protocol (90-100 deg flexion, 45 deg ER/IR in scapular plane); modalities as indicated   ? [place] [id number]   ? Consulted and Agree with Plan of Care Patient   ? ?  ?  ? ?  ? ? ?Patient will benefit from skilled therapeutic intervention in order to improve the following deficits and impairments:    ? ?  Visit Diagnosis: ?Muscle weakness (generalized) ? ?Chronic left shoulder pain ? ?Other symptoms and signs involving the musculoskeletal system ? ?Abnormal posture ? ? ? ? ?Problem List ?Patient Active Problem List  ? Diagnosis Date Noted  ? Greater trochanteric pain syndrome  of left lower extremity 10/20/2019  ? ? ?Everardo All, PT, MPH  ?09/13/2021, 2:46 PM ? ?[place] [facility] ?[place] ?[place], [place], [address] ?Phone: [phone]   Fax:  [phone] ? ?Name: Doyal Saric ?MRN: [id number] ?Date of Birth: October 06, 1955 ? ? ? ?

## 2021-09-17 ENCOUNTER — Other Ambulatory Visit: Payer: Self-pay

## 2021-09-17 ENCOUNTER — Ambulatory Visit: Payer: Medicare Other | Admitting: Physical Therapy

## 2021-09-17 ENCOUNTER — Encounter: Payer: Self-pay | Admitting: Physical Therapy

## 2021-09-17 DIAGNOSIS — R29898 Other symptoms and signs involving the musculoskeletal system: Secondary | ICD-10-CM

## 2021-09-17 DIAGNOSIS — G8929 Other chronic pain: Secondary | ICD-10-CM

## 2021-09-17 DIAGNOSIS — R293 Abnormal posture: Secondary | ICD-10-CM

## 2021-09-17 DIAGNOSIS — M6281 Muscle weakness (generalized): Secondary | ICD-10-CM

## 2021-09-17 NOTE — Patient Instructions (Signed)

## 2021-09-17 NOTE — Therapy (Signed)
[place] ?[facility] ?[address] ?[place], [place], [address] ?Phone: [phone]   Fax:  [phone] ? ?Physical Therapy Treatment and Progress Note ? ?Patient Details  ?Name: Raymond Walsh ?MRN: [id number] ?Date of Birth: 1956/06/20 ?Referring Provider (PT): Dr [name] ? ? ?Encounter Date: 09/17/2021 ? ?Physical Therapy Progress Note ? ?Dates of Reporting Period: 08/16/21 to 09/17/21 ? ? ? PT End of Session - 09/17/21 1346   ? ? Visit Number 10   ? Number of Visits 32   ? Date for PT Re-Evaluation 12/06/21   ? PT Start Time 1346   ? PT Stop Time 2094   ? PT Time Calculation (min) 51 min   ? Activity Tolerance Patient tolerated treatment well   ? Behavior During Therapy [facility] for tasks assessed/performed   ? ?  ?  ? ?  ? ? ?Past Medical History:  ?Diagnosis Date  ? A-fib ([place])   ? 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 Cancer [facility])   ? prostate  ? Dysrhythmia 08/2019  ? A-fib from energy medication  ? ? ?Past Surgical History:  ?Procedure Laterality Date  ? BACK SURGERY  2000  ? plate in B0--J6  ? TOTAL SHOULDER ARTHROPLASTY Left 08/14/2021  ? Procedure: LEFT TOTAL SHOULDER ARTHROPLASTY;  Surgeon: [name], MD;  Location: [facility];  Service: Orthopedics;  Laterality: Left;  ? ? ?There were no vitals filed for this visit. ? ? Subjective Assessment - 09/17/21 1347   ? ? Subjective Frustrated with pain in ant shoulder when doing TE. Sharp pain about 5-6/10.   ? Pertinent History arthritis; Rt TKA   ? Patient Stated Goals get to use the Lt arm again and be able to work without the shoulder hurting   ? Currently in Pain? No/denies   ? ?  ?  ? ?  ? ? ? ? ? [facility] PT Assessment - 09/17/21 0001   ? ?  ? AROM  ? Overall AROM Comments AAROM   ? AROM Assessment Site Shoulder   ? Right/Left Shoulder Left   ? Right Shoulder Flexion 140 Degrees   ? Right Shoulder ABduction 92 Degrees   ?  ? PROM  ? PROM Assessment Site Shoulder   ? Right/Left Shoulder Left   ? Left Shoulder Flexion 146  Degrees   ? Left Shoulder ABduction 120 Degrees   ? Left Shoulder Internal Rotation 58 Degrees   at 45 deg ABD  ? Left Shoulder External Rotation 27 Degrees   at 45 deg ABD  ? ?  ?  ? ?  ? ? ? ? ? ? ? ? ? ? ? ? ? ? ? ? [facility] Adult PT Treatment/Exercise - 09/17/21 0001   ? ?  ? Shoulder Exercises: Supine  ? External Rotation AAROM;10 reps   ? External Rotation Limitations with cane and towel under arm in scapular plane within limits   ? Flexion AAROM;Left;10 reps   ? Flexion Limitations with cane   ? ABduction Left;10 reps   ? ABduction Limitations with cane   ?  ? Shoulder Exercises: Standing  ? Flexion AAROM;15 reps   ? Flexion Limitations to tolerance; on wall with wash cloth   ?  ? Shoulder Exercises: Pulleys  ? Flexion Limitations 10 second holds x 10 reps   ?  ? Shoulder Exercises: Stretch  ? External Rotation Stretch 5 reps;10 seconds   seated towel under arm using cane  ?  ? Modalities  ?  Modalities Iontophoresis   ?  ? Iontophoresis  ? Type of Iontophoresis Dexamethasone   ? Location ant left shoulder   ? Dose 1.0 ml   ? Time 8 hour patch   ? ?  ?  ? ?  ? ? ? ? ? ? ? ? ? ? ? ? PT Short Term Goals - 09/17/21 1350   ? ?  ? PT SHORT TERM GOAL #1  ? Title Improve posture and alignment with patient to demonstrate improve upright posture with posterior shoulder girdle engaged   ? Status Partially Met   ?  ? PT SHORT TERM GOAL #2  ? Title Patient to demonstrate AROM Lt shoulder to 90-100 degrees; ER to 25 degrees in scapular plane   ? Status On-going   ?  ? PT SHORT TERM GOAL #3  ? Title Patient to demonstrate 3+/5 to 4/5 strength Lt shoulder in available range   ? Status On-going   ? ?  ?  ? ?  ? ? ? ? PT Long Term Goals - 08/16/21 1302   ? ?  ? PT LONG TERM GOAL #1  ? Title Patient to demonstrate AROM Lt shoulder to 150-160 deg flexion; 75 degrees ER; 60 degrees IR   ? Time 16   ? Period Weeks   ? Status New   ? Target Date 12/06/21   ?  ? PT LONG TERM GOAL #2  ? Title Increase strength Lt shoulder to 4/5 for ER;  IR; abduction   ? Time 16   ? Period Weeks   ? Status New   ? Target Date 12/06/21   ?  ? PT LONG TERM GOAL #3  ? Title Patient to report improved ability to reach overhead functionally   ? Time 16   ? Period Weeks   ? Status New   ? Target Date 12/06/21   ?  ? PT LONG TERM GOAL #4  ? Title Independent in [place]   ? Time 16   ? Period Weeks   ? Status New   ? Target Date 12/06/21   ?  ? PT LONG TERM GOAL #5  ? Title Improve functional limitation score to 57   ? Time 16   ? Period Weeks   ? Status New   ? Target Date 12/06/21   ? ?  ?  ? ?  ? ? ? ? ? ? ? ? Plan - 09/17/21 1438   ? ? Clinical Impression Statement Patient is progressing with ROM in all planes within protocol limits. He is still wearing his sling and is limited with all ADLs due to post surgery restrictions. He will be able to progress with strengthening after today. He reports ongoing anterior shoulder pain with ROM and is tender to palpation at [facility] biceps. Trial of ionto given today to address this. He will benefit from ongoing skilled PT to gain functional ROM and strength to meet his LTGs. Complete FOTO next visit.   ? PT Frequency 2x / week   ? PT Duration 6 weeks   ? PT Treatment/Interventions ADLs/Self Care Home Management;Aquatic Therapy;Cryotherapy;Electrical Stimulation;Iontophoresis 69m/ml Dexamethasone;Moist Heat;Ultrasound;Functional mobility training;Therapeutic activities;Therapeutic exercise;Neuromuscular re-education;Patient/family education;Manual techniques;Passive range of motion;Dry needling;Taping;Vasopneumatic Device   ? PT Next Visit Plan FOTO; Able to progress with ROM and strengthening per protocol.   ? PT Home Exercise Plan [email or web address]  ? Consulted and Agree with Plan of Care Patient   ? ?  ?  ? ?  ? ? ?  Patient will benefit from skilled therapeutic intervention in order to improve the following deficits and impairments:  Decreased range of motion, Impaired UE functional use, Decreased activity tolerance, Pain, Impaired flexibility,  Improper body mechanics, Decreased mobility, Decreased strength, Increased edema, Impaired sensation, Postural dysfunction ? ?Visit Diagnosis: ?Muscle weakness (generalized) ? ?Chronic left shoulder pain ? ?Other symptoms and signs involving the musculoskeletal system ? ?Abnormal posture ? ? ? ? ?Problem List ?Patient Active Problem List  ? Diagnosis Date Noted  ? Greater trochanteric pain syndrome of left lower extremity 10/20/2019  ? ? ?Madelyn Flavors,  PT ?09/17/2021, 5:02 PM ? ?[place] ?[facility] ?[address] ?[place], [place], [address] ?Phone: [phone]   Fax:  [phone] ? ?Name: Jakarri Lesko ?MRN: [id number] ?Date of Birth: [birth year] ? ? ? ?

## 2021-09-20 ENCOUNTER — Ambulatory Visit: Payer: Medicare Other | Admitting: Rehabilitative and Restorative Service Providers"

## 2021-09-20 ENCOUNTER — Other Ambulatory Visit: Payer: Self-pay

## 2021-09-20 ENCOUNTER — Encounter: Payer: Self-pay | Admitting: Rehabilitative and Restorative Service Providers"

## 2021-09-20 DIAGNOSIS — G8929 Other chronic pain: Secondary | ICD-10-CM

## 2021-09-20 DIAGNOSIS — M6281 Muscle weakness (generalized): Secondary | ICD-10-CM

## 2021-09-20 DIAGNOSIS — R29898 Other symptoms and signs involving the musculoskeletal system: Secondary | ICD-10-CM

## 2021-09-20 DIAGNOSIS — R293 Abnormal posture: Secondary | ICD-10-CM

## 2021-09-20 NOTE — Therapy (Signed)
[place] ?[facility] ?[address] ?[place], [place], [address] ?Phone: [phone]   Fax:  [phone] ? ?Physical Therapy Treatment ? ?Patient Details  ?Name: Raymond Walsh ?MRN: [id number] ?Date of Birth: [birth year] ?Referring Provider (PT): Dr [name] ? ? ?Encounter Date: 09/20/2021 ? ? PT End of Session - 09/20/21 1409   ? ? Visit Number 11   ? Number of Visits 32   ? Date for PT Re-Evaluation 12/06/21   ? PT Start Time 5003   ? PT Stop Time 1455   ? PT Time Calculation (min) 52 min   ? Activity Tolerance Patient tolerated treatment well   ? ?  ?  ? ?  ? ? ?Past Medical History:  ?Diagnosis Date  ? A-fib ([place])   ? 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 Cancer [facility])   ? prostate  ? Dysrhythmia 08/2019  ? A-fib from energy medication  ? ? ?Past Surgical History:  ?Procedure Laterality Date  ? BACK SURGERY  2000  ? plate in B0--W8  ? TOTAL SHOULDER ARTHROPLASTY Left 08/14/2021  ? Procedure: LEFT TOTAL SHOULDER ARTHROPLASTY;  Surgeon: [name], MD;  Location: [facility];  Service: Orthopedics;  Laterality: Left;  ? ? ?There were no vitals filed for this visit. ? ? Subjective Assessment - 09/20/21 1410   ? ? Subjective Shoulder still hurts in the front. Sometimes feels like he is not making any progress.   ? Pertinent History arthritis; Rt TKA   ? Currently in Pain? No/denies   ? Pain Score 0-No pain   ? Pain Location Shoulder   ? Pain Descriptors / Indicators Sore   ? ?  ?  ? ?  ? ? ? ? ? [facility] PT Assessment - 09/20/21 0001   ? ?  ? Assessment  ? Medical Diagnosis Lt TSA   ? Referring Provider (PT) Dr [name]   ? Onset Date/Surgical Date 08/14/21   ? Hand Dominance Right   ? Next MD Visit 09/30/21   ? Prior Therapy here for Lt shoulder pain   ?  ? Observation/Other Assessments  ? Focus on Therapeutic Outcomes (FOTO)  43   ?  ? AROM  ? Overall AROM Comments AAROM   ? Right/Left Shoulder Left   ? Left Shoulder Flexion 119 Degrees   with scapular dyskinesis  ? Left Shoulder  ABduction 92 Degrees   with scapular dyskinesis  ?  ? PROM  ? PROM Assessment Site Shoulder   ? Right/Left Shoulder Left   ? Left Shoulder Flexion 146 Degrees   ? Left Shoulder ABduction 120 Degrees   ? Left Shoulder Internal Rotation 58 Degrees   at 45 deg ABD  ? Left Shoulder External Rotation 27 Degrees   at 45 deg ABD  ?  ? Palpation  ? Palpation comment muscular tightness Lt upper quarter - through the pecs; anterior deltiod; biceps   ? ?  ?  ? ?  ? ? ? ? ? ? ? ? ? ? ? ? ? ? ? ? [place] Adult PT Treatment/Exercise - 09/20/21 0001   ? ?  ? Shoulder Exercises: Standing  ? External Rotation Strengthening;Left;10 reps;Theraband   ? Theraband Level (Shoulder External Rotation) Level 1 (Yellow)   ? External Rotation Limitations reactive isometric   ? Internal Rotation Strengthening;Left;10 reps;Theraband   ? Theraband Level (Shoulder Internal Rotation) Level 1 (Yellow)   ? Internal Rotation Limitations reactive isometric   ? Flexion AROM;AAROM;Left;10 reps   ?  Flexion Limitations to tolerance; on wall with wash cloth   ?  ? Shoulder Exercises: Pulleys  ? Flexion Limitations 10 second holds x 10 reps   ?  ? Shoulder Exercises: Isometric Strengthening  ? External Rotation 5X5"   ? Internal Rotation 5X5"   ? ABduction 5X5"   ?  ? Shoulder Exercises: Stretch  ? External Rotation Stretch 5 reps;10 seconds   standing towel under arm using cane  ?  ? Vasopneumatic  ? Number Minutes Vasopneumatic  10 minutes   ? Vasopnuematic Location  Shoulder   ? Vasopneumatic Pressure Low   ? Vasopneumatic Temperature  34?   ?  ? Manual Therapy  ? Manual therapy comments scar tissue massage on L anterior shoulder;   ? Soft tissue mobilization soft tissue work through the anterior shoulder girdle - pecs/anterior deltior/biceps; upper trap   ? Passive ROM Lt soulder flexion to ~ 115 degrees; ER in scaption to ~ 25 degrees; extesnion to neutral with elbow extension UE resting on table   ? ?  ?  ? ?  ? ? ? ? ? ? ? ? ? ? PT Education - 09/20/21  1428   ? ? Education Details HEP   ? Person(s) Educated Patient   ? Methods Explanation;Demonstration;Tactile cues;Verbal cues;Handout   ? Comprehension Verbalized understanding;Returned demonstration;Verbal cues required;Tactile cues required   ? ?  ?  ? ?  ? ? ? PT Short Term Goals - 09/17/21 1350   ? ?  ? PT SHORT TERM GOAL #1  ? Title Improve posture and alignment with patient to demonstrate improve upright posture with posterior shoulder girdle engaged   ? Status Partially Met   ?  ? PT SHORT TERM GOAL #2  ? Title Patient to demonstrate AROM Lt shoulder to 90-100 degrees; ER to 25 degrees in scapular plane   ? Status On-going   ?  ? PT SHORT TERM GOAL #3  ? Title Patient to demonstrate 3+/5 to 4/5 strength Lt shoulder in available range   ? Status On-going   ? ?  ?  ? ?  ? ? ? ? PT Long Term Goals - 08/16/21 1302   ? ?  ? PT LONG TERM GOAL #1  ? Title Patient to demonstrate AROM Lt shoulder to 150-160 deg flexion; 75 degrees ER; 60 degrees IR   ? Time 16   ? Period Weeks   ? Status New   ? Target Date 12/06/21   ?  ? PT LONG TERM GOAL #2  ? Title Increase strength Lt shoulder to 4/5 for ER; IR; abduction   ? Time 16   ? Period Weeks   ? Status New   ? Target Date 12/06/21   ?  ? PT LONG TERM GOAL #3  ? Title Patient to report improved ability to reach overhead functionally   ? Time 16   ? Period Weeks   ? Status New   ? Target Date 12/06/21   ?  ? PT LONG TERM GOAL #4  ? Title Independent in [place]   ? Time 16   ? Period Weeks   ? Status New   ? Target Date 12/06/21   ?  ? PT LONG TERM GOAL #5  ? Title Improve functional limitation score to 57   ? Time 16   ? Period Weeks   ? Status New   ? Target Date 12/06/21   ? ?  ?  ? ?  ? ? ? ? ? ? ? ?  Plan - 09/20/21 1411   ? ? Clinical Impression Statement Patient reports continued soreness and discomfort in the Lt shoulder. He is still in the sling and working on his exercises. Added light resistiveIR/ER per protocol.Continued with manual work and PROM. Working through  the tight musculature anterior shoulder with PROM per protocol   ? Rehab Potential Good   ? PT Frequency 2x / week   ? PT Duration 6 weeks   ? PT Treatment/Interventions ADLs/Self Care Home Management;Aquatic Therapy;Cryotherapy;Electrical Stimulation;Iontophoresis 46m/ml Dexamethasone;Moist Heat;Ultrasound;Functional mobility training;Therapeutic activities;Therapeutic exercise;Neuromuscular re-education;Patient/family education;Manual techniques;Passive range of motion;Dry needling;Taping;Vasopneumatic Device   ? PT Next Visit Plan FOTO; Able to progress with ROM and strengthening per protocol.   ? PT Home Exercise Plan [email or web address]  ? Consulted and Agree with Plan of Care Patient   ? ?  ?  ? ?  ? ? ?Patient will benefit from skilled therapeutic intervention in order to improve the following deficits and impairments:    ? ?Visit Diagnosis: ?Muscle weakness (generalized) ? ?Chronic left shoulder pain ? ?Other symptoms and signs involving the musculoskeletal system ? ?Abnormal posture ? ? ? ? ?Problem List ?Patient Active Problem List  ? Diagnosis Date Noted  ? Greater trochanteric pain syndrome of left lower extremity 10/20/2019  ? ? ?CEverardo All PT, MPH ?09/20/2021, 3:00 PM ? ?[place] ?[facility] ?[address]?[place] [address]?Phone: [phone]  Fax:  [phone]? ?Name: Raymond Walsh?MRN: [id number]?Date of Birth: [birth year]? ? ? ?

## 2021-09-20 NOTE — Patient Instructions (Signed)
?  Access Code: [email or web address] ?URL: [email or web address] ?Date: 09/20/2021 ?Prepared by: Gillermo Murdoch ? ?Exercises ?- Circular Shoulder Pendulum with Table Support  - 3-4 x daily - 7 x weekly - 1 sets - 20-30 reps ?- Seated Shoulder Flexion Towel Slide at Table Top Full Range of Motion  - 2 x daily - 7 x weekly - 1 sets - 5-10 reps - 10sec  hold ?- Isometric Shoulder Abduction at Wall  - 2 x daily - 7 x weekly - 1 sets - 5-10 reps - 5 sec  hold ?- Isometric Shoulder External Rotation at Wall  - 2 x daily - 7 x weekly - 1 sets - 5 reps - 5 sec  hold ?- Standing Isometric Shoulder Internal Rotation with Towel Roll at Doorway  - 2 x daily - 7 x weekly - 1 sets - 5 reps - 5 sec  hold ?- Seated Isometric Elbow Flexion  - 2 x daily - 7 x weekly - 1 sets - 5-10 reps - 5 sec  hold ?- Seated Shoulder Flexion AAROM with Pulley Behind  - 2 x daily - 7 x weekly - 1 sets - 10 reps - 10 sec  hold ?- Seated Shoulder Scaption AAROM with Pulley at Side  - 2 x daily - 7 x weekly - 1 sets - 10 reps - 10sec  hold ?- Standing Shoulder and Trunk Flexion at Table  - 2 x daily - 7 x weekly - 1 sets - 5 reps - 5 sec  hold ?- Seated Levator Scapulae Stretch  - 2 x daily - 7 x weekly - 3 reps - 30 hold ?- Seated Upper Trapezius Stretch  - 2 x daily - 7 x weekly - 3 reps - 30 hold ?- Shoulder Internal Rotation Reactive Isometrics  - 2 x daily - 7 x weekly - 1 sets - 10 reps - 3-5 sec  hold ?- Shoulder External Rotation Reactive Isometrics  - 2 x daily - 7 x weekly - 1 sets - 10 reps - 3 sec  hold ? ?

## 2021-09-24 ENCOUNTER — Ambulatory Visit: Payer: Medicare Other | Admitting: Physical Therapy

## 2021-09-24 ENCOUNTER — Encounter: Payer: Self-pay | Admitting: Physical Therapy

## 2021-09-24 ENCOUNTER — Other Ambulatory Visit: Payer: Self-pay

## 2021-09-24 DIAGNOSIS — G8929 Other chronic pain: Secondary | ICD-10-CM

## 2021-09-24 DIAGNOSIS — R600 Localized edema: Secondary | ICD-10-CM

## 2021-09-24 DIAGNOSIS — M6281 Muscle weakness (generalized): Secondary | ICD-10-CM

## 2021-09-24 DIAGNOSIS — M25561 Pain in right knee: Secondary | ICD-10-CM

## 2021-09-24 DIAGNOSIS — R29898 Other symptoms and signs involving the musculoskeletal system: Secondary | ICD-10-CM

## 2021-09-24 DIAGNOSIS — R293 Abnormal posture: Secondary | ICD-10-CM

## 2021-09-24 NOTE — Therapy (Signed)
[place] ?[facility] ?[address] ?[place], [place], [address] ?Phone: [phone]   Fax:  [phone] ? ?Physical Therapy Treatment ? ?Patient Details  ?Name: Raymond Walsh ?MRN: [id number] ?Date of Birth: 1956/06/14 ?Referring Provider (PT): Dr [name] ? ? ?Encounter Date: 09/24/2021 ? ? PT End of Session - 09/24/21 1348   ? ? Visit Number 12   ? Number of Visits 32   ? Date for PT Re-Evaluation 12/06/21   ? PT Start Time 1348   ? PT Stop Time 1438   ? PT Time Calculation (min) 50 min   ? Activity Tolerance Patient tolerated treatment well   ? Behavior During Therapy [facility] for tasks assessed/performed   ? ?  ?  ? ?  ? ? ?Past Medical History:  ?Diagnosis Date  ? A-fib ([place])   ? 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 Cancer [facility])   ? prostate  ? Dysrhythmia 08/2019  ? A-fib from energy medication  ? ? ?Past Surgical History:  ?Procedure Laterality Date  ? BACK SURGERY  2000  ? plate in U9--N2  ? TOTAL SHOULDER ARTHROPLASTY Left 08/14/2021  ? Procedure: LEFT TOTAL SHOULDER ARTHROPLASTY;  Surgeon: [name], MD;  Location: [facility];  Service: Orthopedics;  Laterality: Left;  ? ? ?There were no vitals filed for this visit. ? ? Subjective Assessment - 09/24/21 1348   ? ? Subjective Shoulder still hurts in the front. I feel like it's getting a little better in places.   ? Pertinent History arthritis; Rt TKA   ? Patient Stated Goals get to use the Lt arm again and be able to work without the shoulder hurting   ? Currently in Pain? No/denies   ? ?  ?  ? ?  ? ? ? ? ? [facility] PT Assessment - 09/24/21 0001   ? ?  ? Assessment  ? Next MD Visit 10/01/21   ? ?  ?  ? ?  ? ? ? ? ? ? ? ? ? ? ? ? ? ? ? ? [place] Adult PT Treatment/Exercise - 09/24/21 0001   ? ?  ? Shoulder Exercises: Standing  ? External Rotation Strengthening;Left;10 reps;Theraband   ? Theraband Level (Shoulder External Rotation) Level 1 (Yellow)   ? External Rotation Limitations reactive isometric   ? Internal Rotation  Strengthening;Left;10 reps;Theraband   ? Theraband Level (Shoulder Internal Rotation) Level 1 (Yellow)   ? Internal Rotation Limitations reactive isometric   ? Flexion AROM;AAROM;Left;10 reps   ? Flexion Limitations to tolerance; on wall with wash cloth   with step in toward wall  ?  ? Shoulder Exercises: Pulleys  ? Flexion 1 minute   ? Flexion Limitations 10 second holds x 10 reps   ?  ? Shoulder Exercises: Isometric Strengthening  ? External Rotation 5X5"   ? Internal Rotation 5X5"   ? ABduction 5X5"   ?  ? Shoulder Exercises: Stretch  ? External Rotation Stretch 5 reps;10 seconds   2 sets; standing towel under arm using cane  ?  ? Vasopneumatic  ? Number Minutes Vasopneumatic  10 minutes   ? Vasopnuematic Location  Shoulder   ? Vasopneumatic Pressure Low   ? Vasopneumatic Temperature  34   ?  ? Manual Therapy  ? Manual Therapy Soft tissue mobilization   ? Soft tissue mobilization IASTM  to ant/post shoulder; deltoids, biceps   ? ?  ?  ? ?  ? ? ? ? ? ? ? ? ? ? ? ?  PT Short Term Goals - 09/17/21 1350   ? ?  ? PT SHORT TERM GOAL #1  ? Title Improve posture and alignment with patient to demonstrate improve upright posture with posterior shoulder girdle engaged   ? Status Partially Met   ?  ? PT SHORT TERM GOAL #2  ? Title Patient to demonstrate AROM Lt shoulder to 90-100 degrees; ER to 25 degrees in scapular plane   ? Status On-going   ?  ? PT SHORT TERM GOAL #3  ? Title Patient to demonstrate 3+/5 to 4/5 strength Lt shoulder in available range   ? Status On-going   ? ?  ?  ? ?  ? ? ? ? PT Long Term Goals - 08/16/21 1302   ? ?  ? PT LONG TERM GOAL #1  ? Title Patient to demonstrate AROM Lt shoulder to 150-160 deg flexion; 75 degrees ER; 60 degrees IR   ? Time 16   ? Period Weeks   ? Status New   ? Target Date 12/06/21   ?  ? PT LONG TERM GOAL #2  ? Title Increase strength Lt shoulder to 4/5 for ER; IR; abduction   ? Time 16   ? Period Weeks   ? Status New   ? Target Date 12/06/21   ?  ? PT LONG TERM GOAL #3  ? Title  Patient to report improved ability to reach overhead functionally   ? Time 16   ? Period Weeks   ? Status New   ? Target Date 12/06/21   ?  ? PT LONG TERM GOAL #4  ? Title Independent in [place]   ? Time 16   ? Period Weeks   ? Status New   ? Target Date 12/06/21   ?  ? PT LONG TERM GOAL #5  ? Title Improve functional limitation score to 57   ? Time 16   ? Period Weeks   ? Status New   ? Target Date 12/06/21   ? ?  ?  ? ?  ? ? ? ? ? ? ? ? Plan - 09/24/21 1430   ? ? Clinical Impression Statement continued pain in anterior left shoulder. Tolerated IASTM well with good response to tissues.   ? PT Frequency 2x / week   ? PT Duration 6 weeks   ? PT Treatment/Interventions ADLs/Self Care Home Management;Aquatic Therapy;Cryotherapy;Electrical Stimulation;Iontophoresis 68m/ml Dexamethasone;Moist Heat;Ultrasound;Functional mobility training;Therapeutic activities;Therapeutic exercise;Neuromuscular re-education;Patient/family education;Manual techniques;Passive range of motion;Dry needling;Taping;Vasopneumatic Device   ? PT Next Visit Plan Able to progress with ROM and strengthening per protocol.   ? Consulted and Agree with Plan of Care Patient   ? ?  ?  ? ?  ? ? ?Patient will benefit from skilled therapeutic intervention in order to improve the following deficits and impairments:  Decreased range of motion, Impaired UE functional use, Decreased activity tolerance, Pain, Impaired flexibility, Improper body mechanics, Decreased mobility, Decreased strength, Increased edema, Impaired sensation, Postural dysfunction ? ?Visit Diagnosis: ?Muscle weakness (generalized) ? ?Chronic left shoulder pain ? ?Other symptoms and signs involving the musculoskeletal system ? ?Abnormal posture ? ?Acute pain of right knee ? ?Localized edema ? ? ? ? ?Problem List ?Patient Active Problem List  ? Diagnosis Date Noted  ? Greater trochanteric pain syndrome of left lower extremity 10/20/2019  ? ? ?Jule Emersyn Kotarski, PT ?09/24/2021, 3:24 PM ? ?[facility] ?[facility] ?[address]?[place] [address]?Phone: [phone]  Fax:  [phone] ? ?Name: Jocsan Mcginley ?MRN: [id number] ?Date of Birth: 1956/02/13 ? ? ? ?

## 2021-09-27 ENCOUNTER — Ambulatory Visit: Payer: Medicare Other | Admitting: Rehabilitative and Restorative Service Providers"

## 2021-09-27 ENCOUNTER — Encounter: Payer: Self-pay | Admitting: Rehabilitative and Restorative Service Providers"

## 2021-09-27 DIAGNOSIS — R29898 Other symptoms and signs involving the musculoskeletal system: Secondary | ICD-10-CM

## 2021-09-27 DIAGNOSIS — G8929 Other chronic pain: Secondary | ICD-10-CM

## 2021-09-27 DIAGNOSIS — R293 Abnormal posture: Secondary | ICD-10-CM

## 2021-09-27 DIAGNOSIS — M6281 Muscle weakness (generalized): Secondary | ICD-10-CM

## 2021-09-27 NOTE — Therapy (Signed)
[place] [facility] ?[facility] ?[address] ?[place], [place], [address] ?Phone: [phone]   Fax:  [phone] ? ?Physical Therapy Treatment ? ?Patient Details  ?Name: Raymond Walsh ?MRN: [id number] ?Date of Birth: October 05, 1955 ?Referring Provider (PT): Dr [name] ? ? ?Encounter Date: 09/27/2021 ? ? PT End of Session - 09/27/21 1404   ? ? Visit Number 13   ? Number of Visits 32   ? Date for PT Re-Evaluation 12/06/21   ? PT Start Time 1400   ? PT Stop Time 6378   ? PT Time Calculation (min) 48 min   ? Activity Tolerance Patient tolerated treatment well   ? ?  ?  ? ?  ? ? ?Past Medical History:  ?Diagnosis Date  ? A-fib ([place])   ? 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 Cancer [facility])   ? prostate  ? Dysrhythmia 08/2019  ? A-fib from energy medication  ? ? ?Past Surgical History:  ?Procedure Laterality Date  ? BACK SURGERY  2000  ? plate in H8--I5  ? TOTAL SHOULDER ARTHROPLASTY Left 08/14/2021  ? Procedure: LEFT TOTAL SHOULDER ARTHROPLASTY;  Surgeon: [name], MD;  Location: [facility];  Service: Orthopedics;  Laterality: Left;  ? ? ?There were no vitals filed for this visit. ? ? Subjective Assessment - 09/27/21 1406   ? ? Subjective Shoulder is feeling a lot better. Less pain and able to do ore with the shoulder.   ? Currently in Pain? No/denies   ? Pain Score 0-No pain   ? Pain Location Shoulder   ? Pain Orientation Left   ? ?  ?  ? ?  ? ? ? ? ? ? ? ? ? ? ? ? ? ? ? ? ? ? ? ? [place] Adult PT Treatment/Exercise - 09/27/21 0001   ? ?  ? Shoulder Exercises: Prone  ? Retraction Strengthening;Both;10 reps   5 sec hold  ? Extension Strengthening;Left;10 reps   5 sec hold  ? Other Prone Exercises row 10 reps x 5 sec hold   ?  ? Shoulder Exercises: Standing  ? External Rotation Strengthening;Left;10 reps;Theraband   ? Theraband Level (Shoulder External Rotation) Level 1 (Yellow)   ? External Rotation Limitations reactive isometric   ? Internal Rotation Strengthening;Left;10 reps;Theraband   ?  Theraband Level (Shoulder Internal Rotation) Level 1 (Yellow)   ? Internal Rotation Limitations reactive isometric   ? Flexion AROM;AAROM;Left;10 reps   ? Flexion Limitations to tolerance; on wall with wash cloth   with step in toward wall  ?  ? Shoulder Exercises: Pulleys  ? Flexion Limitations 10 second holds x 10 reps   ?  ? Shoulder Exercises: Stretch  ? External Rotation Stretch 5 reps;10 seconds   2 sets; standing towel under arm using cane  ?  ? Cryotherapy  ? Number Minutes Cryotherapy 10 Minutes   ? Cryotherapy Location Shoulder   ? Type of Cryotherapy Ice pack   ?  ? Manual Therapy  ? Manual Therapy Soft tissue mobilization   ? Soft tissue mobilization STM ant/post shoulder; deltoids, biceps   ? Passive ROM Lt soulder flexion to ~ 115 degrees; ER in scaption to ~ 25 degrees; extesnion to neutral with elbow extension UE resting on table   ? ?  ?  ? ?  ? ? ? ? ? ? ? ? ? ? PT Education - 09/27/21 1420   ? ? Education Details HEP   ? Person(s) Educated Patient   ?  Methods Explanation;Demonstration;Tactile cues;Verbal cues;Handout   ? Comprehension Verbalized understanding;Returned demonstration;Verbal cues required;Tactile cues required   ? ?  ?  ? ?  ? ? ? PT Short Term Goals - 09/17/21 1350   ? ?  ? PT SHORT TERM GOAL #1  ? Title Improve posture and alignment with patient to demonstrate improve upright posture with posterior shoulder girdle engaged   ? Status Partially Met   ?  ? PT SHORT TERM GOAL #2  ? Title Patient to demonstrate AROM Lt shoulder to 90-100 degrees; ER to 25 degrees in scapular plane   ? Status On-going   ?  ? PT SHORT TERM GOAL #3  ? Title Patient to demonstrate 3+/5 to 4/5 strength Lt shoulder in available range   ? Status On-going   ? ?  ?  ? ?  ? ? ? ? PT Long Term Goals - 08/16/21 1302   ? ?  ? PT LONG TERM GOAL #1  ? Title Patient to demonstrate AROM Lt shoulder to 150-160 deg flexion; 75 degrees ER; 60 degrees IR   ? Time 16   ? Period Weeks   ? Status New   ? Target Date 12/06/21    ?  ? PT LONG TERM GOAL #2  ? Title Increase strength Lt shoulder to 4/5 for ER; IR; abduction   ? Time 16   ? Period Weeks   ? Status New   ? Target Date 12/06/21   ?  ? PT LONG TERM GOAL #3  ? Title Patient to report improved ability to reach overhead functionally   ? Time 16   ? Period Weeks   ? Status New   ? Target Date 12/06/21   ?  ? PT LONG TERM GOAL #4  ? Title Independent in [place]   ? Time 16   ? Period Weeks   ? Status New   ? Target Date 12/06/21   ?  ? PT LONG TERM GOAL #5  ? Title Improve functional limitation score to 57   ? Time 16   ? Period Weeks   ? Status New   ? Target Date 12/06/21   ? ?  ?  ? ?  ? ? ? ? ? ? ? ? Plan - 09/27/21 1410   ? ? Clinical Impression Statement Progressing well with less pain and he is progressing well with exercises within the protocol. Note to MD at next visit.   ? Rehab Potential Good   ? PT Frequency 2x / week   ? PT Duration 6 weeks   ? PT Treatment/Interventions ADLs/Self Care Home Management;Aquatic Therapy;Cryotherapy;Electrical Stimulation;Iontophoresis 50m/ml Dexamethasone;Moist Heat;Ultrasound;Functional mobility training;Therapeutic activities;Therapeutic exercise;Neuromuscular re-education;Patient/family education;Manual techniques;Passive range of motion;Dry needling;Taping;Vasopneumatic Device   ? PT Next Visit Plan progress with ROM and strengthening per protocol.   ? PT Home Exercise Plan [email or web address]  ? Consulted and Agree with Plan of Care Patient   ? ?  ?  ? ?  ? ? ?Patient will benefit from skilled therapeutic intervention in order to improve the following deficits and impairments:    ? ?Visit Diagnosis: ?Muscle weakness (generalized) ? ?Chronic left shoulder pain ? ?Other symptoms and signs involving the musculoskeletal system ? ?Abnormal posture ? ? ? ? ?Problem List ?Patient Active Problem List  ? Diagnosis Date Noted  ? Greater trochanteric pain syndrome of left lower extremity 10/20/2019  ? ? ?CEverardo All PT, MPH  ?09/27/2021, 2:43 PM ? ?[facility] ?[facility] ?[address] ?[place], [place], [address] ?Phone: [phone]   Fax:  [phone] ? ?Name: Raymond Walsh ?MRN: [id number] ?Date of Birth: October 12, 1955 ? ? ? ?

## 2021-09-27 NOTE — Patient Instructions (Signed)
Access Code: [email or web address] ?URL: [email or web address] ?Date: 09/27/2021 ?Prepared by: Gillermo Murdoch ? ?Exercises ?- Circular Shoulder Pendulum with Table Support  - 3-4 x daily - 7 x weekly - 1 sets - 20-30 reps ?- Seated Shoulder Flexion Towel Slide at Table Top Full Range of Motion  - 2 x daily - 7 x weekly - 1 sets - 5-10 reps - 10sec  hold ?- Isometric Shoulder Abduction at Wall  - 2 x daily - 7 x weekly - 1 sets - 5-10 reps - 5 sec  hold ?- Isometric Shoulder External Rotation at Wall  - 2 x daily - 7 x weekly - 1 sets - 5 reps - 5 sec  hold ?- Standing Isometric Shoulder Internal Rotation with Towel Roll at Doorway  - 2 x daily - 7 x weekly - 1 sets - 5 reps - 5 sec  hold ?- Seated Isometric Elbow Flexion  - 2 x daily - 7 x weekly - 1 sets - 5-10 reps - 5 sec  hold ?- Seated Shoulder Flexion AAROM with Pulley Behind  - 2 x daily - 7 x weekly - 1 sets - 10 reps - 10 sec  hold ?- Seated Shoulder Scaption AAROM with Pulley at Side  - 2 x daily - 7 x weekly - 1 sets - 10 reps - 10sec  hold ?- Standing Shoulder and Trunk Flexion at Table  - 2 x daily - 7 x weekly - 1 sets - 5 reps - 5 sec  hold ?- Seated Levator Scapulae Stretch  - 2 x daily - 7 x weekly - 3 reps - 30 hold ?- Seated Upper Trapezius Stretch  - 2 x daily - 7 x weekly - 3 reps - 30 hold ?- Shoulder Internal Rotation Reactive Isometrics  - 2 x daily - 7 x weekly - 1 sets - 10 reps - 3-5 sec  hold ?- Shoulder External Rotation Reactive Isometrics  - 2 x daily - 7 x weekly - 1 sets - 10 reps - 3 sec  hold ?- Prone Scapular Retraction  - 1 x daily - 7 x weekly - 1-2 sets - 5-10 reps - 3-5 sec  hold ?- Prone Shoulder Extension  - 1 x daily - 7 x weekly - 1-2 sets - 10 reps - 3 sec  hold ?- Prone Scapular Retraction and Row  - 1 x daily - 7 x weekly - 1-2 sets - 10 reps - 3 sec  hold ?

## 2021-10-01 ENCOUNTER — Encounter: Payer: Self-pay | Admitting: Rehabilitative and Restorative Service Providers"

## 2021-10-01 ENCOUNTER — Ambulatory Visit: Payer: Medicare Other | Attending: Orthopedic Surgery | Admitting: Rehabilitative and Restorative Service Providers"

## 2021-10-01 DIAGNOSIS — R600 Localized edema: Secondary | ICD-10-CM | POA: Diagnosis present

## 2021-10-01 DIAGNOSIS — M25512 Pain in left shoulder: Secondary | ICD-10-CM | POA: Diagnosis present

## 2021-10-01 DIAGNOSIS — R29898 Other symptoms and signs involving the musculoskeletal system: Secondary | ICD-10-CM

## 2021-10-01 DIAGNOSIS — G8929 Other chronic pain: Secondary | ICD-10-CM | POA: Diagnosis present

## 2021-10-01 DIAGNOSIS — R293 Abnormal posture: Secondary | ICD-10-CM | POA: Diagnosis present

## 2021-10-01 DIAGNOSIS — M6281 Muscle weakness (generalized): Secondary | ICD-10-CM

## 2021-10-01 NOTE — Therapy (Signed)
[place] ?[facility] ?[address] ?[place], [place], [address] ?Phone: [phone]   Fax:  [phone] ? ?Physical Therapy Treatment ? ?Patient Details  ?Name: Raymond Walsh ?MRN: [id number] ?Date of Birth: [birth year] ?Referring Provider (PT): Dr [name] ? ? ?Encounter Date: 10/01/2021 ? ? PT End of Session - 10/01/21 1352   ? ? Visit Number 14   ? Number of Visits 32   ? Date for PT Re-Evaluation 12/06/21   ? PT Start Time 1348   ? PT Stop Time 1436   ? PT Time Calculation (min) 48 min   ? Activity Tolerance Patient tolerated treatment well   ? ?  ?  ? ?  ? ? ?Past Medical History:  ?Diagnosis Date  ? A-fib ([place])   ? 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 Cancer [facility])   ? prostate  ? Dysrhythmia 08/2019  ? A-fib from energy medication  ? ? ?Past Surgical History:  ?Procedure Laterality Date  ? BACK SURGERY  2000  ? plate in B2--I2  ? TOTAL SHOULDER ARTHROPLASTY Left 08/14/2021  ? Procedure: LEFT TOTAL SHOULDER ARTHROPLASTY;  Surgeon: [name], MD;  Location: [facility];  Service: Orthopedics;  Laterality: Left;  ? ? ?There were no vitals filed for this visit. ? ? Subjective Assessment - 10/01/21 1355   ? ? Subjective Didn't sleep well last night. Tried to sleep without the sling. Also reached back behind his body and had a sharp pain. Experiencing some sharp pains in the anterior shoulder today. RTD tomorrow.   ? Currently in Pain? No/denies   ? Pain Score 0-No pain   ? Pain Location Shoulder   ? Pain Orientation Left   ? Pain Descriptors / Indicators Sore   ? ?  ?  ? ?  ? ? ? ? ? [facility] PT Assessment - 10/01/21 0001   ? ?  ? Assessment  ? Medical Diagnosis Lt TSA   ? Referring Provider (PT) Dr [name]   ? Onset Date/Surgical Date 08/14/21   ? Hand Dominance Right   ? Next MD Visit 10/02/21   ? Prior Therapy here for Lt shoulder pain   ?  ? AROM  ? Overall AROM Comments unable to assess AAROM due to pain   ?  ? PROM  ? Overall PROM Comments within tissue limits and minimal  pain   ? PROM Assessment Site Shoulder   ? Right/Left Shoulder Left   ? Left Shoulder Flexion 118 Degrees   ? Left Shoulder ABduction 120 Degrees   in scapular plane  ? Left Shoulder Internal Rotation 30 Degrees   in scapular plane  ? Left Shoulder External Rotation 51 Degrees   in scapular plane  ?  ? Palpation  ? Palpation comment muscular tightness Lt upper quarter - through the pecs; anterior deltiod; biceps - sinificant tightness noted in biceps tendon area and into biceps muscle   ? ?  ?  ? ?  ? ? ? ? ? ? ? ? ? ? ? ? ? ? ? ? ? ? ? ? ? ? ? ? ? ? ? PT Short Term Goals - 09/17/21 1350   ? ?  ? PT SHORT TERM GOAL #1  ? Title Improve posture and alignment with patient to demonstrate improve upright posture with posterior shoulder girdle engaged   ? Status Partially Met   ?  ? PT SHORT TERM GOAL #2  ? Title Patient to demonstrate AROM Lt  shoulder to 90-100 degrees; ER to 25 degrees in scapular plane   ? Status On-going   ?  ? PT SHORT TERM GOAL #3  ? Title Patient to demonstrate 3+/5 to 4/5 strength Lt shoulder in available range   ? Status On-going   ? ?  ?  ? ?  ? ? ? ? PT Long Term Goals - 08/16/21 1302   ? ?  ? PT LONG TERM GOAL #1  ? Title Patient to demonstrate AROM Lt shoulder to 150-160 deg flexion; 75 degrees ER; 60 degrees IR   ? Time 16   ? Period Weeks   ? Status New   ? Target Date 12/06/21   ?  ? PT LONG TERM GOAL #2  ? Title Increase strength Lt shoulder to 4/5 for ER; IR; abduction   ? Time 16   ? Period Weeks   ? Status New   ? Target Date 12/06/21   ?  ? PT LONG TERM GOAL #3  ? Title Patient to report improved ability to reach overhead functionally   ? Time 16   ? Period Weeks   ? Status New   ? Target Date 12/06/21   ?  ? PT LONG TERM GOAL #4  ? Title Independent in [place]   ? Time 16   ? Period Weeks   ? Status New   ? Target Date 12/06/21   ?  ? PT LONG TERM GOAL #5  ? Title Improve functional limitation score to 57   ? Time 16   ? Period Weeks   ? Status New   ? Target Date 12/06/21   ? ?  ?  ? ?   ? ? ? ? ? ? ? ? Plan - 10/01/21 1357   ? ? Clinical Impression Statement Progressing well with decreased pain and gradually increasing ROM and exercise tolerance prior to today's appointment. Patient reports incresaed pain in anterior Lt shoulder with movement and most exercises. Did not tolerate exercises today. Continued with manual work and PROM to pt tolerance followed by vaso. MD to evaluate pain and irritation of shoulder. PT to proceed as indicated. Patient may benefit from trial of DN. Will continue rehab as tolerated, per MD and per rehab protocol.   ? Rehab Potential Good   ? PT Frequency 2x / week   ? PT Duration 12 weeks   ? PT Treatment/Interventions ADLs/Self Care Home Management;Aquatic Therapy;Cryotherapy;Electrical Stimulation;Iontophoresis 77m/ml Dexamethasone;Moist Heat;Ultrasound;Functional mobility training;Therapeutic activities;Therapeutic exercise;Neuromuscular re-education;Patient/family education;Manual techniques;Passive range of motion;Dry needling;Taping;Vasopneumatic Device   ? PT Next Visit Plan progress with ROM and strengthening per protocol.   ? PT Home Exercise Plan [email or web address]  ? Consulted and Agree with Plan of Care Patient   ? ?  ?  ? ?  ? ? ?Patient will benefit from skilled therapeutic intervention in order to improve the following deficits and impairments:    ? ?Visit Diagnosis: ?Muscle weakness (generalized) ? ?Chronic left shoulder pain ? ?Other symptoms and signs involving the musculoskeletal system ? ? ? ? ?Problem List ?Patient Active Problem List  ? Diagnosis Date Noted  ? Greater trochanteric pain syndrome of left lower extremity 10/20/2019  ? ? ?CEverardo All PT. MPH ?10/01/2021, 2:35 PM ? ?[facility] ?[facility] ?[address] [address]?Phone: [phone]  Fax:  [phone]? ?Name: Raymond Walsh?MRN: [id number]?Date of Birth: [birth year]? ? ? ?

## 2021-10-04 ENCOUNTER — Ambulatory Visit: Payer: Medicare Other | Admitting: Rehabilitative and Restorative Service Providers"

## 2021-10-08 ENCOUNTER — Encounter: Payer: Self-pay | Admitting: Rehabilitative and Restorative Service Providers"

## 2021-10-08 ENCOUNTER — Ambulatory Visit: Payer: Medicare Other | Admitting: Rehabilitative and Restorative Service Providers"

## 2021-10-08 DIAGNOSIS — R600 Localized edema: Secondary | ICD-10-CM

## 2021-10-08 DIAGNOSIS — R29898 Other symptoms and signs involving the musculoskeletal system: Secondary | ICD-10-CM

## 2021-10-08 DIAGNOSIS — M6281 Muscle weakness (generalized): Secondary | ICD-10-CM | POA: Diagnosis not present

## 2021-10-08 DIAGNOSIS — R293 Abnormal posture: Secondary | ICD-10-CM

## 2021-10-08 DIAGNOSIS — G8929 Other chronic pain: Secondary | ICD-10-CM

## 2021-10-08 NOTE — Therapy (Signed)
[place] ?[facility] ?[address] ?[place], [place], [address] ?Phone: [phone]   Fax:  [phone] ? ?Physical Therapy Treatment ? ?Patient Details  ?Name: Raymond Walsh ?MRN: [id number] ?Date of Birth: [birth year] ?Referring Provider (PT): Dr [name] ? ? ?Encounter Date: 10/08/2021 ? ? PT End of Session - 10/08/21 1354   ? ? Visit Number 15   ? Number of Visits 32   ? Date for PT Re-Evaluation 12/06/21   ? PT Start Time 1351   pt late for appt  ? PT Stop Time 1435   ? PT Time Calculation (min) 44 min   ? Activity Tolerance Patient tolerated treatment well   ? ?  ?  ? ?  ? ? ?Past Medical History:  ?Diagnosis Date  ? A-fib ([place])   ? 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 Cancer [facility])   ? prostate  ? Dysrhythmia 08/2019  ? A-fib from energy medication  ? ? ?Past Surgical History:  ?Procedure Laterality Date  ? BACK SURGERY  2000  ? plate in X8--Z3  ? TOTAL SHOULDER ARTHROPLASTY Left 08/14/2021  ? Procedure: LEFT TOTAL SHOULDER ARTHROPLASTY;  Surgeon: [name], MD;  Location: [facility];  Service: Orthopedics;  Laterality: Left;  ? ? ?There were no vitals filed for this visit. ? ? Subjective Assessment - 10/08/21 1355   ? ? Subjective MD said to hold progression of exercises due to flare up of shoulder pain. Patient reports that he fell at home 10/03/21 striking his head requiring sutures. He did not hit his Lt shoulder and feels that he did not further injure his shoulder. He has felt sore all over following the fall.   ? Currently in Pain? Yes   ? Pain Score 0-No pain   ? Pain Location Shoulder   ? Pain Orientation Left   ? Pain Descriptors / Indicators Sore;Tightness   ? Pain Type Surgical pain   ? Pain Onset More than a month ago   ? Pain Frequency Intermittent   ? Aggravating Factors  moving arm   ? Pain Relieving Factors ice, meds   ? ?  ?  ? ?  ? ? ? ? ? ? ? ? ? ? ? ? ? ? ? ? ? ? ? ? [place] Adult PT Treatment/Exercise - 10/08/21 0001   ? ?  ? Shoulder Exercises: Standing   ? External Rotation Strengthening;Left;10 reps;Theraband   ? Theraband Level (Shoulder External Rotation) Level 1 (Yellow)   ? External Rotation Limitations reactive isometric   ? Internal Rotation Strengthening;Left;10 reps;Theraband   ? Theraband Level (Shoulder Internal Rotation) Level 1 (Yellow)   ? Internal Rotation Limitations reactive isometric   ? Flexion AROM;AAROM;Left;10 reps   ?  ? Shoulder Exercises: Pulleys  ? Flexion 1 minute   ? Flexion Limitations 10 second holds x 10 reps   ?  ? Shoulder Exercises: Isometric Strengthening  ? External Rotation 5X5"   ? Internal Rotation 5X5"   ? ABduction 5X5"   ?  ? Shoulder Exercises: Stretch  ? External Rotation Stretch 5 reps;10 seconds   2 sets; standing towel under arm using cane  ? Other Shoulder Stretches table slide active ROM hip height x 10 reps then flexion on ~ 40 deg incline x 10 reps   ?  ? Vasopneumatic  ? Number Minutes Vasopneumatic  10 minutes   ? Vasopnuematic Location  Shoulder   ? Vasopneumatic Pressure Low   ? Vasopneumatic Temperature  34   ?  ? Manual Therapy  ? Manual Therapy Soft tissue mobilization   ? Soft tissue mobilization STM ant/post shoulder; deltoids, biceps   ? Passive ROM Lt soulder flexion to ~ 115 degrees; ER in scaption to ~ 25 degrees; extesnion to neutral with elbow extension UE resting on table   ? ?  ?  ? ?  ? ? ? ? ? ? ? ? ? ? ? ? PT Short Term Goals - 09/17/21 1350   ? ?  ? PT SHORT TERM GOAL #1  ? Title Improve posture and alignment with patient to demonstrate improve upright posture with posterior shoulder girdle engaged   ? Status Partially Met   ?  ? PT SHORT TERM GOAL #2  ? Title Patient to demonstrate AROM Lt shoulder to 90-100 degrees; ER to 25 degrees in scapular plane   ? Status On-going   ?  ? PT SHORT TERM GOAL #3  ? Title Patient to demonstrate 3+/5 to 4/5 strength Lt shoulder in available range   ? Status On-going   ? ?  ?  ? ?  ? ? ? ? PT Long Term Goals - 08/16/21 1302   ? ?  ? PT LONG TERM GOAL #1  ?  Title Patient to demonstrate AROM Lt shoulder to 150-160 deg flexion; 75 degrees ER; 60 degrees IR   ? Time 16   ? Period Weeks   ? Status New   ? Target Date 12/06/21   ?  ? PT LONG TERM GOAL #2  ? Title Increase strength Lt shoulder to 4/5 for ER; IR; abduction   ? Time 16   ? Period Weeks   ? Status New   ? Target Date 12/06/21   ?  ? PT LONG TERM GOAL #3  ? Title Patient to report improved ability to reach overhead functionally   ? Time 16   ? Period Weeks   ? Status New   ? Target Date 12/06/21   ?  ? PT LONG TERM GOAL #4  ? Title Independent in [place]   ? Time 16   ? Period Weeks   ? Status New   ? Target Date 12/06/21   ?  ? PT LONG TERM GOAL #5  ? Title Improve functional limitation score to 57   ? Time 16   ? Period Weeks   ? Status New   ? Target Date 12/06/21   ? ?  ?  ? ?  ? ? ? ? ? ? ? ? Plan - 10/08/21 1400   ? ? Clinical Impression Statement Lt shoulder remains sore and irritated from last week. He has pain with gentle exercises and continues to have limited toerance for PROM. Pain with movement. Will hold on progression of exercises per protocol pending improvement of symptoms.   ? Rehab Potential Good   ? PT Frequency 2x / week   ? PT Duration 12 weeks   ? PT Treatment/Interventions ADLs/Self Care Home Management;Aquatic Therapy;Cryotherapy;Electrical Stimulation;Iontophoresis 31m/ml Dexamethasone;Moist Heat;Ultrasound;Functional mobility training;Therapeutic activities;Therapeutic exercise;Neuromuscular re-education;Patient/family education;Manual techniques;Passive range of motion;Dry needling;Taping;Vasopneumatic Device   ? PT Next Visit Plan progress with ROM and strengthening as patient tolerates and per protocol.   ? PT Home Exercise Plan [id number]  ? Consulted and Agree with Plan of Care Patient   ? ?  ?  ? ?  ? ? ?Patient will benefit from skilled therapeutic intervention in order to improve the following deficits and impairments:    ? ?  Visit Diagnosis: ?Muscle weakness (generalized) ? ?Chronic  left shoulder pain ? ?Other symptoms and signs involving the musculoskeletal system ? ?Abnormal posture ? ?Localized edema ? ? ? ? ?Problem List ?Patient Active Problem List  ? Diagnosis Date Noted  ? Greater trochanteric pain syndrome of left lower extremity 10/20/2019  ? ? ?Everardo All, PT, MPH  ?10/08/2021, 2:18 PM ? ?[place] ?[facility] ?[address] ?[place], [place], [address] ?Phone: [phone]   Fax:  [phone] ? ?Name: Raymond Walsh ?MRN: [id number] ?Date of Birth: [birth year] ? ? ? ?

## 2021-10-11 ENCOUNTER — Ambulatory Visit: Payer: Medicare Other | Admitting: Rehabilitative and Restorative Service Providers"

## 2021-10-11 ENCOUNTER — Encounter: Payer: Self-pay | Admitting: Rehabilitative and Restorative Service Providers"

## 2021-10-11 DIAGNOSIS — R293 Abnormal posture: Secondary | ICD-10-CM

## 2021-10-11 DIAGNOSIS — M6281 Muscle weakness (generalized): Secondary | ICD-10-CM | POA: Diagnosis not present

## 2021-10-11 DIAGNOSIS — G8929 Other chronic pain: Secondary | ICD-10-CM

## 2021-10-11 DIAGNOSIS — R600 Localized edema: Secondary | ICD-10-CM

## 2021-10-11 DIAGNOSIS — R29898 Other symptoms and signs involving the musculoskeletal system: Secondary | ICD-10-CM

## 2021-10-11 NOTE — Therapy (Signed)
[place] ?[facility] ?[place] [place] ?[place], [place], [address] ?Phone: [phone]   Fax:  [phone] ? ?Physical Therapy Treatment ? ?Patient Details  ?Name: Raymond Walsh ?MRN: [id number] ?Date of Birth: Dec 03, 1955 ?Referring Provider (PT): Dr [name] ? ? ?Encounter Date: 10/11/2021 ? ? PT End of Session - 10/11/21 1407   ? ? Visit Number 16   ? Number of Visits 32   ? Date for PT Re-Evaluation 12/06/21   ? Progress Note Due on Visit 20   ? PT Start Time 1402   ? PT Stop Time 1450   ? PT Time Calculation (min) 48 min   ? Activity Tolerance Patient tolerated treatment well   ? ?  ?  ? ?  ? ? ?Past Medical History:  ?Diagnosis Date  ? A-fib ([place])   ? 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 Cancer [facility])   ? prostate  ? Dysrhythmia 08/2019  ? A-fib from energy medication  ? ? ?Past Surgical History:  ?Procedure Laterality Date  ? BACK SURGERY  2000  ? plate in A6--T0  ? TOTAL SHOULDER ARTHROPLASTY Left 08/14/2021  ? Procedure: LEFT TOTAL SHOULDER ARTHROPLASTY;  Surgeon: [name], MD;  Location: [facility];  Service: Orthopedics;  Laterality: Left;  ? ? ?There were no vitals filed for this visit. ? ? Subjective Assessment - 10/11/21 1408   ? ? Subjective Shoulder is feeling better than last week. Feels that he has recovered from the flare up last week reaching back and then falling.   ? Currently in Pain? No/denies   ? Pain Score 0-No pain   ? Pain Location Shoulder   ? ?  ?  ? ?  ? ? ? ? ? ? ? ? ? ? ? ? ? ? ? ? ? ? ? ? [place] Adult PT Treatment/Exercise - 10/11/21 0001   ? ?  ? Shoulder Exercises: Sidelying  ? External Rotation AROM;Strengthening;Left;20 reps   ?  ? Shoulder Exercises: Standing  ? External Rotation Strengthening;Left;10 reps;Theraband   ? Theraband Level (Shoulder External Rotation) Level 1 (Yellow)   ? External Rotation Limitations reactive isometric   ? Internal Rotation Strengthening;Left;10 reps;Theraband   ? Theraband Level (Shoulder Internal Rotation) Level  1 (Yellow)   ? Internal Rotation Limitations reactive isometric   ?  ? Shoulder Exercises: Pulleys  ? Flexion 1 minute   ? Flexion Limitations 10 second holds x 10 reps   ?  ? Shoulder Exercises: ROM/Strengthening  ? Rhythmic Stabilization, Supine flex/ext; horizontal ab/add; circles CW/CCW x 10 reps each   ?  ? Shoulder Exercises: Isometric Strengthening  ? External Rotation 5X5"   ? Internal Rotation 5X5"   ? ABduction 5X5"   ?  ? Shoulder Exercises: Stretch  ? External Rotation Stretch 5 reps;10 seconds   2 sets; standing towel under arm using cane  ? Other Shoulder Stretches table slide active ROM hip height x 10 reps then flexion on ~ 40 deg incline x 10 reps   ?  ? Vasopneumatic  ? Number Minutes Vasopneumatic  10 minutes   ? Vasopnuematic Location  Shoulder   ? Vasopneumatic Pressure Low   ? Vasopneumatic Temperature  34   ?  ? Manual Therapy  ? Manual Therapy Soft tissue mobilization   ? Soft tissue mobilization STM ant/post shoulder; deltoids, biceps, into the teres and   ? Passive ROM Lt soulder flexion to ~ 115 degrees; ER in scaption to ~ 25 degrees;  extesnion to neutral with elbow extension UE resting on table   ? ?  ?  ? ?  ? ? ? ? ? ? ? ? ? ? PT Education - 10/11/21 1426   ? ? Education Details HEP   ? Person(s) Educated Patient   ? Methods Explanation;Demonstration;Tactile cues;Verbal cues;Handout   ? Comprehension Verbalized understanding;Returned demonstration;Verbal cues required;Tactile cues required   ? ?  ?  ? ?  ? ? ? PT Short Term Goals - 09/17/21 1350   ? ?  ? PT SHORT TERM GOAL #1  ? Title Improve posture and alignment with patient to demonstrate improve upright posture with posterior shoulder girdle engaged   ? Status Partially Met   ?  ? PT SHORT TERM GOAL #2  ? Title Patient to demonstrate AROM Lt shoulder to 90-100 degrees; ER to 25 degrees in scapular plane   ? Status On-going   ?  ? PT SHORT TERM GOAL #3  ? Title Patient to demonstrate 3+/5 to 4/5 strength Lt shoulder in available  range   ? Status On-going   ? ?  ?  ? ?  ? ? ? ? PT Long Term Goals - 08/16/21 1302   ? ?  ? PT LONG TERM GOAL #1  ? Title Patient to demonstrate AROM Lt shoulder to 150-160 deg flexion; 75 degrees ER; 60 degrees IR   ? Time 16   ? Period Weeks   ? Status New   ? Target Date 12/06/21   ?  ? PT LONG TERM GOAL #2  ? Title Increase strength Lt shoulder to 4/5 for ER; IR; abduction   ? Time 16   ? Period Weeks   ? Status New   ? Target Date 12/06/21   ?  ? PT LONG TERM GOAL #3  ? Title Patient to report improved ability to reach overhead functionally   ? Time 16   ? Period Weeks   ? Status New   ? Target Date 12/06/21   ?  ? PT LONG TERM GOAL #4  ? Title Independent in [place]   ? Time 16   ? Period Weeks   ? Status New   ? Target Date 12/06/21   ?  ? PT LONG TERM GOAL #5  ? Title Improve functional limitation score to 57   ? Time 16   ? Period Weeks   ? Status New   ? Target Date 12/06/21   ? ?  ?  ? ?  ? ? ? ? ? ? ? ? Plan - 10/11/21 1411   ? ? Clinical Impression Statement Lt shoulder is improving with resolution of pain from last weeks reach back and when he fell. Reviewed and progressed with exercises as tolerated.   ? Rehab Potential Good   ? PT Frequency 2x / week   ? PT Duration 12 weeks   ? PT Treatment/Interventions ADLs/Self Care Home Management;Aquatic Therapy;Cryotherapy;Electrical Stimulation;Iontophoresis 23m/ml Dexamethasone;Moist Heat;Ultrasound;Functional mobility training;Therapeutic activities;Therapeutic exercise;Neuromuscular re-education;Patient/family education;Manual techniques;Passive range of motion;Dry needling;Taping;Vasopneumatic Device   ? PT Next Visit Plan progress with ROM and strengthening as patient tolerates and per protocol.   ? PT Home Exercise Plan [email or web address]  ? Consulted and Agree with Plan of Care Patient   ? ?  ?  ? ?  ? ? ?Patient will benefit from skilled therapeutic intervention in order to improve the following deficits and impairments:    ? ?Visit Diagnosis: ?Muscle weakness  (generalized) ? ?Chronic  left shoulder pain ? ?Other symptoms and signs involving the musculoskeletal system ? ?Abnormal posture ? ?Localized edema ? ? ? ? ?Problem List ?Patient Active Problem List  ? Diagnosis Date Noted  ? Greater trochanteric pain syndrome of left lower extremity 10/20/2019  ? ? ?Everardo All, PT, MPH  ?10/11/2021, 2:37 PM ? ?[place] ?[facility] ?[place] ?[place], [place], [address] ?Phone: [phone]   Fax:  [phone] ? ?Name: Raymond Walsh ?MRN: [id number] ?Date of Birth: July 20, 1955 ? ? ? ?

## 2021-10-11 NOTE — Patient Instructions (Signed)
Access Code: [email or web address] ?URL: [email or web address] ?Date: 10/11/2021 ?Prepared by: Gillermo Murdoch ? ?Exercises ?- Circular Shoulder Pendulum with Table Support  - 3-4 x daily - 7 x weekly - 1 sets - 20-30 reps ?- Seated Shoulder Flexion Towel Slide at Table Top Full Range of Motion  - 2 x daily - 7 x weekly - 1 sets - 5-10 reps - 10sec  hold ?- Isometric Shoulder Abduction at Wall  - 2 x daily - 7 x weekly - 1 sets - 5-10 reps - 5 sec  hold ?- Isometric Shoulder External Rotation at Wall  - 2 x daily - 7 x weekly - 1 sets - 5 reps - 5 sec  hold ?- Standing Isometric Shoulder Internal Rotation with Towel Roll at Doorway  - 2 x daily - 7 x weekly - 1 sets - 5 reps - 5 sec  hold ?- Seated Isometric Elbow Flexion  - 2 x daily - 7 x weekly - 1 sets - 5-10 reps - 5 sec  hold ?- Seated Shoulder Flexion AAROM with Pulley Behind  - 2 x daily - 7 x weekly - 1 sets - 10 reps - 10 sec  hold ?- Seated Shoulder Scaption AAROM with Pulley at Side  - 2 x daily - 7 x weekly - 1 sets - 10 reps - 10sec  hold ?- Standing Shoulder and Trunk Flexion at Table  - 2 x daily - 7 x weekly - 1 sets - 5 reps - 5 sec  hold ?- Seated Levator Scapulae Stretch  - 2 x daily - 7 x weekly - 3 reps - 30 hold ?- Seated Upper Trapezius Stretch  - 2 x daily - 7 x weekly - 3 reps - 30 hold ?- Shoulder Internal Rotation Reactive Isometrics  - 2 x daily - 7 x weekly - 1 sets - 10 reps - 3-5 sec  hold ?- Shoulder External Rotation Reactive Isometrics  - 2 x daily - 7 x weekly - 1 sets - 10 reps - 3 sec  hold ?- Prone Scapular Retraction  - 1 x daily - 7 x weekly - 1-2 sets - 5-10 reps - 3-5 sec  hold ?- Prone Shoulder Extension  - 1 x daily - 7 x weekly - 1-2 sets - 10 reps - 3 sec  hold ?- Prone Scapular Retraction and Row  - 1 x daily - 7 x weekly - 1-2 sets - 10 reps - 3 sec  hold ?- Sidelying Shoulder External Rotation  - 1 x daily - 7 x weekly - 1 sets - 10 reps - 3 sec  hold ?- Supine Shoulder Circles  - 1 x daily - 7 x weekly - 1-2 sets - 10  reps ?

## 2021-10-15 ENCOUNTER — Encounter: Payer: Self-pay | Admitting: Rehabilitative and Restorative Service Providers"

## 2021-10-15 ENCOUNTER — Ambulatory Visit: Payer: Medicare Other | Admitting: Rehabilitative and Restorative Service Providers"

## 2021-10-15 DIAGNOSIS — R29898 Other symptoms and signs involving the musculoskeletal system: Secondary | ICD-10-CM

## 2021-10-15 DIAGNOSIS — M6281 Muscle weakness (generalized): Secondary | ICD-10-CM | POA: Diagnosis not present

## 2021-10-15 DIAGNOSIS — R600 Localized edema: Secondary | ICD-10-CM

## 2021-10-15 DIAGNOSIS — R293 Abnormal posture: Secondary | ICD-10-CM

## 2021-10-15 DIAGNOSIS — G8929 Other chronic pain: Secondary | ICD-10-CM

## 2021-10-15 NOTE — Patient Instructions (Signed)
Access Code: [email or web address] ?URL: [email or web address] ?Date: 10/15/2021 ?Prepared by: Gillermo Murdoch ? ?Exercises ?- Circular Shoulder Pendulum with Table Support  - 3-4 x daily - 7 x weekly - 1 sets - 20-30 reps ?- Seated Shoulder Flexion Towel Slide at Table Top Full Range of Motion  - 2 x daily - 7 x weekly - 1 sets - 5-10 reps - 10sec  hold ?- Isometric Shoulder Abduction at Wall  - 2 x daily - 7 x weekly - 1 sets - 5-10 reps - 5 sec  hold ?- Isometric Shoulder External Rotation at Wall  - 2 x daily - 7 x weekly - 1 sets - 5 reps - 5 sec  hold ?- Standing Isometric Shoulder Internal Rotation with Towel Roll at Doorway  - 2 x daily - 7 x weekly - 1 sets - 5 reps - 5 sec  hold ?- Seated Isometric Elbow Flexion  - 2 x daily - 7 x weekly - 1 sets - 5-10 reps - 5 sec  hold ?- Seated Shoulder Flexion AAROM with Pulley Behind  - 2 x daily - 7 x weekly - 1 sets - 10 reps - 10 sec  hold ?- Seated Shoulder Scaption AAROM with Pulley at Side  - 2 x daily - 7 x weekly - 1 sets - 10 reps - 10sec  hold ?- Standing Shoulder and Trunk Flexion at Table  - 2 x daily - 7 x weekly - 1 sets - 5 reps - 5 sec  hold ?- Seated Levator Scapulae Stretch  - 2 x daily - 7 x weekly - 3 reps - 30 hold ?- Seated Upper Trapezius Stretch  - 2 x daily - 7 x weekly - 3 reps - 30 hold ?- Shoulder Internal Rotation Reactive Isometrics  - 2 x daily - 7 x weekly - 1 sets - 10 reps - 3-5 sec  hold ?- Shoulder External Rotation Reactive Isometrics  - 2 x daily - 7 x weekly - 1 sets - 10 reps - 3 sec  hold ?- Prone Scapular Retraction  - 1 x daily - 7 x weekly - 1-2 sets - 5-10 reps - 3-5 sec  hold ?- Prone Shoulder Extension  - 1 x daily - 7 x weekly - 1-2 sets - 10 reps - 3 sec  hold ?- Prone Scapular Retraction and Row  - 1 x daily - 7 x weekly - 1-2 sets - 10 reps - 3 sec  hold ?- Sidelying Shoulder External Rotation  - 1 x daily - 7 x weekly - 1 sets - 10 reps - 3 sec  hold ?- Supine Shoulder Circles  - 1 x daily - 7 x weekly - 1-2 sets - 10  reps ?- Sidelying Shoulder Flexion 15 Degrees  - 1 x daily - 7 x weekly - 1-2 sets - 10 reps - 2-3 sec  hold ?

## 2021-10-15 NOTE — Therapy (Signed)
[place] ?[facility] ?[place] ?[place], [place], [address] ?Phone: [phone]   Fax:  [phone] ? ?Physical Therapy Treatment ? ?Patient Details  ?Name: Raymond Walsh ?MRN: [id number] ?Date of Birth: April 13, 1956 ?Referring Provider (PT): Dr [name] ? ? ?Encounter Date: 10/15/2021 ? ? PT End of Session - 10/15/21 1350   ? ? Visit Number 17   ? Number of Visits 32   ? Date for PT Re-Evaluation 12/06/21   ? Authorization - Visit Number 17   ? Progress Note Due on Visit 20   ? PT Start Time 1348   ? PT Stop Time 1436   ? PT Time Calculation (min) 48 min   ? Activity Tolerance Patient tolerated treatment well   ? ?  ?  ? ?  ? ? ?Past Medical History:  ?Diagnosis Date  ? A-fib ([place])   ? 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 Cancer [facility])   ? prostate  ? Dysrhythmia 08/2019  ? A-fib from energy medication  ? ? ?Past Surgical History:  ?Procedure Laterality Date  ? BACK SURGERY  2000  ? plate in O3--F2  ? TOTAL SHOULDER ARTHROPLASTY Left 08/14/2021  ? Procedure: LEFT TOTAL SHOULDER ARTHROPLASTY;  Surgeon: [name], MD;  Location: [facility];  Service: Orthopedics;  Laterality: Left;  ? ? ?There were no vitals filed for this visit. ? ? Subjective Assessment - 10/15/21 1351   ? ? Subjective Working on exercises. Now driving and out of the sling except when sleeping. Has continued pain through the front of the shoulder with occasional sharp pain in that area.   ? Currently in Pain? No/denies   ? Pain Score 0-No pain   ? Pain Location Shoulder   ? Pain Orientation Left   ? Pain Descriptors / Indicators Sore;Tightness   ? Pain Onset More than a month ago   ? Pain Frequency Intermittent   ? ?  ?  ? ?  ? ? ? ? ? [facility] PT Assessment - 10/15/21 0001   ? ?  ? Assessment  ? Medical Diagnosis Lt TSA   ? Referring Provider (PT) Dr [name]   ? Onset Date/Surgical Date 08/14/21   ? Hand Dominance Right   ? Next MD Visit 10/02/21   ? Prior Therapy here for Lt shoulder pain   ?  ?  Observation/Other Assessments-Edema   ? Edema --   persistent mild to mod edema Lt shoulder girdle  ?  ? AROM  ? Right Shoulder Extension 62 Degrees   ? Right Shoulder Flexion 140 Degrees   ? Right Shoulder ABduction 92 Degrees   ? Right Shoulder Internal Rotation --   thumb T10  ? Right Shoulder External Rotation 90 Degrees   shoulder 90 deg/elbow 90 deg flexion  ? Left Shoulder Flexion 122 Degrees   ? Left Shoulder ABduction 125 Degrees   scapular plane  ? Left Shoulder External Rotation 17 Degrees   elbow at side  ?  ? PROM  ? Overall PROM Comments within tissue limits and minimal pain   ? PROM Assessment Site Shoulder   ? Left Shoulder Flexion 130 Degrees   ? Left Shoulder ABduction 133 Degrees   in scapular plane  ? Left Shoulder Internal Rotation 30 Degrees   in scapular plane  ? Left Shoulder External Rotation 51 Degrees   in scapular plane  ?  ? Palpation  ? Palpation comment muscular tightness Lt upper quarter - through the  pecs; anterior deltiod; biceps - sinificant tightness noted in biceps tendon area and into biceps muscle   ? ?  ?  ? ?  ? ? ? ? ? ? ? ? ? ? ? ? ? ? ? ? [place] Adult PT Treatment/Exercise - 10/15/21 0001   ? ?  ? Shoulder Exercises: Prone  ? Retraction Strengthening;Both;10 reps   ? Retraction Limitations prone row 5 sec hold   ? Extension Strengthening;Left;10 reps   5 sec hold  ?  ? Shoulder Exercises: Sidelying  ? External Rotation AROM;Strengthening;Left;20 reps   ? Flexion AAROM;Strengthening;Left;10 reps   ? Flexion Limitations hand supported on cane for assist to support weight of Lt UE as patient moved through flexin range to tolerance   ?  ? Shoulder Exercises: Standing  ? External Rotation Strengthening;Left;10 reps;Theraband   ? Theraband Level (Shoulder External Rotation) Level 1 (Yellow)   ? External Rotation Limitations reactive isometric   ? Internal Rotation Strengthening;Left;10 reps;Theraband   ? Theraband Level (Shoulder Internal Rotation) Level 1 (Yellow)   ? Internal  Rotation Limitations reactive isometric   ? Flexion AROM;AAROM;Strengthening;Left;20 reps   ? Shoulder Flexion Weight (lbs) hand on pillowcase sliding hand up wall   ?  ? Shoulder Exercises: Pulleys  ? Flexion 1 minute   ? Flexion Limitations 10 second holds x 10 reps   ? Other Pulley Exercises butterfly with UE's within patient tolerance to work on gentle ROM Lt shoulder girdle   ?  ? Shoulder Exercises: ROM/Strengthening  ? Pendulum 20 reps circle CW/CCW   ?  ? Shoulder Exercises: Stretch  ? External Rotation Stretch 5 reps;10 seconds   2 sets; standing towel under arm using cane  ? Table Stretch -Flexion Limitations shoulder flexin step back - hands resting on counter - patient stepping back keeping weight on LE's stretching into flexion 10 sec hold x 5 reps   ?  ? Vasopneumatic  ? Number Minutes Vasopneumatic  10 minutes   ? Vasopnuematic Location  Shoulder   ? Vasopneumatic Pressure Low   ? Vasopneumatic Temperature  34   ?  ? Manual Therapy  ? Manual Therapy Soft tissue mobilization   ? Soft tissue mobilization STM ant/post shoulder; deltoids, biceps, into the teres and   ? Passive ROM Lt shoulder flexion to ~ 115 degrees; ER in scaption to ~ 25 degrees; extesnion to neutral with elbow extension UE resting on table   ? ?  ?  ? ?  ? ? ? ? ? ? ? ? ? ? PT Education - 10/15/21 1412   ? ? Education Details HEP   ? Person(s) Educated Patient   ? Methods Explanation;Demonstration;Tactile cues;Verbal cues;Handout   ? Comprehension Verbalized understanding;Returned demonstration;Verbal cues required;Tactile cues required   ? ?  ?  ? ?  ? ? ? PT Short Term Goals - 09/17/21 1350   ? ?  ? PT SHORT TERM GOAL #1  ? Title Improve posture and alignment with patient to demonstrate improve upright posture with posterior shoulder girdle engaged   ? Status Partially Met   ?  ? PT SHORT TERM GOAL #2  ? Title Patient to demonstrate AROM Lt shoulder to 90-100 degrees; ER to 25 degrees in scapular plane   ? Status On-going   ?  ? PT  SHORT TERM GOAL #3  ? Title Patient to demonstrate 3+/5 to 4/5 strength Lt shoulder in available range   ? Status On-going   ? ?  ?  ? ?  ? ? ? ?  PT Long Term Goals - 08/16/21 1302   ? ?  ? PT LONG TERM GOAL #1  ? Title Patient to demonstrate AROM Lt shoulder to 150-160 deg flexion; 75 degrees ER; 60 degrees IR   ? Time 16   ? Period Weeks   ? Status New   ? Target Date 12/06/21   ?  ? PT LONG TERM GOAL #2  ? Title Increase strength Lt shoulder to 4/5 for ER; IR; abduction   ? Time 16   ? Period Weeks   ? Status New   ? Target Date 12/06/21   ?  ? PT LONG TERM GOAL #3  ? Title Patient to report improved ability to reach overhead functionally   ? Time 16   ? Period Weeks   ? Status New   ? Target Date 12/06/21   ?  ? PT LONG TERM GOAL #4  ? Title Independent in [place]   ? Time 16   ? Period Weeks   ? Status New   ? Target Date 12/06/21   ?  ? PT LONG TERM GOAL #5  ? Title Improve functional limitation score to 57   ? Time 16   ? Period Weeks   ? Status New   ? Target Date 12/06/21   ? ?  ?  ? ?  ? ? ? ? ? ? ? ? Plan - 10/15/21 1352   ? ? Clinical Impression Statement Continued tightness in the anterior Lt shoulder. Progressing gradually with shoulder rehab per protocol. Improving again after flare up of pain and limited exercise tolerance ~ 10 days ago wen pt tried to reach back with Lt UE. Now progressing with exercises on track with protocol with minimal pain. Still has some anterior shoulder pain with elevation of shoulder. May benefit from trial of DN to pecs, anterior deltoid, biceps.   ? Rehab Potential Good   ? PT Frequency 2x / week   ? PT Duration 12 weeks   ? PT Treatment/Interventions ADLs/Self Care Home Management;Aquatic Therapy;Cryotherapy;Electrical Stimulation;Iontophoresis 59m/ml Dexamethasone;Moist Heat;Ultrasound;Functional mobility training;Therapeutic activities;Therapeutic exercise;Neuromuscular re-education;Patient/family education;Manual techniques;Passive range of motion;Dry  needling;Taping;Vasopneumatic Device   ? PT Next Visit Plan progress with ROM and strengthening as patient tolerates and per protocol.   ? PT Home Exercise Plan [email or web address]  ? Consulted and Agree with Plan of Care Patient   ? ?  ?  ?

## 2021-10-24 ENCOUNTER — Encounter: Payer: Self-pay | Admitting: Rehabilitative and Restorative Service Providers"

## 2021-10-24 ENCOUNTER — Ambulatory Visit: Payer: Medicare Other | Admitting: Rehabilitative and Restorative Service Providers"

## 2021-10-24 DIAGNOSIS — M6281 Muscle weakness (generalized): Secondary | ICD-10-CM | POA: Diagnosis not present

## 2021-10-24 DIAGNOSIS — R29898 Other symptoms and signs involving the musculoskeletal system: Secondary | ICD-10-CM

## 2021-10-24 DIAGNOSIS — R293 Abnormal posture: Secondary | ICD-10-CM

## 2021-10-24 DIAGNOSIS — R600 Localized edema: Secondary | ICD-10-CM

## 2021-10-24 DIAGNOSIS — G8929 Other chronic pain: Secondary | ICD-10-CM

## 2021-10-24 NOTE — Patient Instructions (Addendum)
Trigger Point Dry Needling ? ?What is Trigger Point Dry Needling (DN)? ?DN is a physical therapy technique used to treat muscle pain and dysfunction. Specifically, DN helps deactivate muscle trigger points (muscle knots).  ?A thin filiform needle is used to penetrate the skin and stimulate the underlying trigger point. The goal is for a local twitch response (LTR) to occur and for the trigger point to relax. No medication of any kind is injected during the procedure.  ? ?What Does Trigger Point Dry Needling Feel Like?  ?The procedure feels different for each individual patient. Some patients report that they do not actually feel the needle enter the skin and overall the process is not painful. Very mild bleeding may occur. However, many patients feel a deep cramping in the muscle in which the needle was inserted. This is the local twitch response.  ? ?How Will I feel after the treatment? ?Soreness is normal, and the onset of soreness may not occur for a few hours. Typically this soreness does not last longer than two days.  ?Bruising is uncommon, however; ice can be used to decrease any possible bruising.  ?In rare cases feeling tired or nauseous after the treatment is normal. In addition, your symptoms may get worse before they get better, this period will typically not last longer than 24 hours.  ? ?What Can I do After My Treatment? ?Increase your hydration by drinking more water for the next 24 hours. ?You may place ice or heat on the areas treated that have become sore, however, do not use heat on inflamed or bruised areas. Heat often brings more relief post needling. ?You can continue your regular activities, but vigorous activity is not recommended initially after the treatment for 24 hours. ?DN is best combined with other physical therapy such as strengthening, stretching, and other therapies.  ? ? ?Access Code: [email or web address] ?URL: [email or web address] ?Date: 10/24/2021 ?Prepared by: Gillermo Murdoch ? ?Exercises ?- Circular Shoulder Pendulum with Table Support  - 3-4 x daily - 7 x weekly - 1 sets - 20-30 reps ?- Seated Shoulder Flexion Towel Slide at Table Top Full Range of Motion  - 2 x daily - 7 x weekly - 1 sets - 5-10 reps - 10sec  hold ?- Isometric Shoulder Abduction at Wall  - 2 x daily - 7 x weekly - 1 sets - 5-10 reps - 5 sec  hold ?- Isometric Shoulder External Rotation at Wall  - 2 x daily - 7 x weekly - 1 sets - 5 reps - 5 sec  hold ?- Standing Isometric Shoulder Internal Rotation with Towel Roll at Doorway  - 2 x daily - 7 x weekly - 1 sets - 5 reps - 5 sec  hold ?- Seated Isometric Elbow Flexion  - 2 x daily - 7 x weekly - 1 sets - 5-10 reps - 5 sec  hold ?- Seated Shoulder Flexion AAROM with Pulley Behind  - 2 x daily - 7 x weekly - 1 sets - 10 reps - 10 sec  hold ?- Seated Shoulder Scaption AAROM with Pulley at Side  - 2 x daily - 7 x weekly - 1 sets - 10 reps - 10sec  hold ?- Standing Shoulder and Trunk Flexion at Table  - 2 x daily - 7 x weekly - 1 sets - 5 reps - 5 sec  hold ?- Seated Levator Scapulae Stretch  - 2 x daily - 7 x weekly - 3 reps - 30 hold ?- Seated  Upper Trapezius Stretch  - 2 x daily - 7 x weekly - 3 reps - 30 hold ?- Shoulder Internal Rotation Reactive Isometrics  - 2 x daily - 7 x weekly - 1 sets - 10 reps - 3-5 sec  hold ?- Shoulder External Rotation Reactive Isometrics  - 2 x daily - 7 x weekly - 1 sets - 10 reps - 3 sec  hold ?- Prone Scapular Retraction  - 1 x daily - 7 x weekly - 1-2 sets - 5-10 reps - 3-5 sec  hold ?- Prone Shoulder Extension  - 1 x daily - 7 x weekly - 1-2 sets - 10 reps - 3 sec  hold ?- Prone Scapular Retraction and Row  - 1 x daily - 7 x weekly - 1-2 sets - 10 reps - 3 sec  hold ?- Sidelying Shoulder External Rotation  - 1 x daily - 7 x weekly - 1 sets - 10 reps - 3 sec  hold ?- Supine Shoulder Circles  - 1 x daily - 7 x weekly - 1-2 sets - 10 reps ?- Sidelying Shoulder Flexion 15 Degrees  - 1 x daily - 7 x weekly - 1-2 sets - 10 reps - 2-3 sec   hold ?- Shoulder Internal Rotation with Resistance  - 2 x daily - 7 x weekly - 1 sets - 3 reps - 30 sec  hold ?- Shoulder External Rotation with Anchored Resistance  - 2 x daily - 7 x weekly - 1-2 sets - 10 reps - 3 sec  hold ?- Standing Shoulder Row Reactive Isometric  - 2 x daily - 7 x weekly - 1 sets - 10 reps - 30-45 sec  hold ?- Standing Shoulder Flexion to 90 Degrees  - 2 x daily - 7 x weekly - 1 sets - 10 reps - 2-3 sec  hold ?- Supine Chest Stretch with Elbows Bent  - 2 x daily - 7 x weekly - 1 sets - 3-5 reps - 20-30 sec  hold ?

## 2021-10-24 NOTE — Therapy (Signed)
[place] ?[facility] ?[place] ?[place], [place], [address] ?Phone: [phone]   Fax:  [phone] ? ?Physical Therapy Treatment ? ?Patient Details  ?Name: Raymond Walsh ?MRN: [id number] ?Date of Birth: 1955/08/23 ?Referring Provider (PT): Dr [name] ? ? ?Encounter Date: 10/24/2021 ? ? PT End of Session - 10/24/21 1353   ? ? Visit Number 18   ? Number of Visits 32   ? Date for PT Re-Evaluation 12/06/21   ? Authorization - Visit Number 18   ? Progress Note Due on Visit 20   ? PT Start Time 1346   ? PT Stop Time 2229   ? PT Time Calculation (min) 48 min   ? Activity Tolerance Patient tolerated treatment well   ? ?  ?  ? ?  ? ? ?Past Medical History:  ?Diagnosis Date  ? A-fib ([place])   ? 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 Cancer [facility])   ? prostate  ? Dysrhythmia 08/2019  ? A-fib from energy medication  ? ? ?Past Surgical History:  ?Procedure Laterality Date  ? BACK SURGERY  2000  ? plate in N9--G9  ? TOTAL SHOULDER ARTHROPLASTY Left 08/14/2021  ? Procedure: LEFT TOTAL SHOULDER ARTHROPLASTY;  Surgeon: [name], MD;  Location: [facility];  Service: Orthopedics;  Laterality: Left;  ? ? ?There were no vitals filed for this visit. ? ? Subjective Assessment - 10/24/21 1353   ? ? Subjective Exercises are going well. Still having some pain in the "top" of the shoulder   ? Currently in Pain? No/denies   ? Pain Score 0-No pain   ? ?  ?  ? ?  ? ? ? ? ? ? ? ? ? ? ? ? ? ? ? ? ? ? ? ? [place] Adult PT Treatment/Exercise - 10/24/21 0001   ? ?  ? Shoulder Exercises: Prone  ? Retraction Strengthening;Both;10 reps   ? Retraction Limitations prone row 5 sec hold   ? Extension Strengthening;Left;10 reps   5 sec hold  ?  ? Shoulder Exercises: Sidelying  ? External Rotation AROM;Strengthening;Left;20 reps   ? Flexion AAROM;Strengthening;Left;10 reps   ? Flexion Limitations hand supported on cane for assist to support weight of Lt UE as patient moved through flexin range to tolerance   ?  ?  Shoulder Exercises: Standing  ? External Rotation Strengthening;Left;20 reps   ? Theraband Level (Shoulder External Rotation) Level 1 (Yellow)   ? External Rotation Limitations reactive isometric   ? Internal Rotation Strengthening;Left;10 reps;Theraband   ? Theraband Level (Shoulder Internal Rotation) Level 1 (Yellow)   ? Internal Rotation Limitations reactive isometric   ? Flexion AROM;AAROM;Strengthening;Left;20 reps   ? Shoulder Flexion Weight (lbs) hand on pillowcase sliding hand up wall   ? Flexion Limitations AROM to 90 deg VC to pull shoulders down and back   ? Other Standing Exercises active shoulder flexion to 90 deg VC to keep shoulders down and back and avoid elevation of the Lt shoulder x 10 reps   ?  ? Shoulder Exercises: Pulleys  ? Flexion 1 minute   ? Flexion Limitations 10 second holds x 10 reps   ?  ? Shoulder Exercises: ROM/Strengthening  ? Pendulum 20 reps circle CW/CCW   ? Rhythmic Stabilization, Supine flex/ext; horizontal ab/add; circles CW/CCW x 10 reps each   ? Other ROM/Strengthening Exercises ER holding Lt UE with Rt bringing Lt UE to 90 deg flexion then touching forehead to stretch into ER 20-30  sec x 5 reps   ?  ? Shoulder Exercises: Stretch  ? External Rotation Stretch 5 reps;10 seconds   2 sets; standing towel under arm using cane  ? Table Stretch -Flexion Limitations shoulder flexion step back - hands resting on counter - patient stepping back keeping weight on LE's stretching into flexion 10 sec hold x 5 reps   ?  ? Vasopneumatic  ? Number Minutes Vasopneumatic  10 minutes   ? Vasopnuematic Location  Shoulder   ? Vasopneumatic Pressure Low   ? Vasopneumatic Temperature  34   ?  ? Manual Therapy  ? Manual Therapy Soft tissue mobilization   ? Manual therapy comments skilled palpatioin to assess response to DN and manual work   ? Soft tissue mobilization STM ant/post shoulder; deltoids, biceps, into the teres and   ? Passive ROM Lt shoulder flexion to ~ 115 degrees; ER in scaption to ~  25 degrees; extesnion to neutral with elbow extension UE resting on table   ? ?  ?  ? ?  ? ? ? [place] Dry Needling - 10/24/21 0001   ? ? Consent Given? Yes   ? Education Handout Provided Yes   ? Other Dry Needling Lt   ? Deltoid Response Palpable increased muscle length;Twitch response elicited   ? Biceps Response Palpable increased muscle length;Twitch response elicited   ? ?  ?  ? ?  ? ? ? ? ? ? ? ? PT Education - 10/24/21 1430   ? ? Education Details HEP; DN   ? Person(s) Educated Patient   ? Methods Explanation;Demonstration;Tactile cues;Verbal cues;Handout   ? Comprehension Verbalized understanding;Returned demonstration;Verbal cues required;Tactile cues required   ? ?  ?  ? ?  ? ? ? PT Short Term Goals - 09/17/21 1350   ? ?  ? PT SHORT TERM GOAL #1  ? Title Improve posture and alignment with patient to demonstrate improve upright posture with posterior shoulder girdle engaged   ? Status Partially Met   ?  ? PT SHORT TERM GOAL #2  ? Title Patient to demonstrate AROM Lt shoulder to 90-100 degrees; ER to 25 degrees in scapular plane   ? Status On-going   ?  ? PT SHORT TERM GOAL #3  ? Title Patient to demonstrate 3+/5 to 4/5 strength Lt shoulder in available range   ? Status On-going   ? ?  ?  ? ?  ? ? ? ? PT Long Term Goals - 08/16/21 1302   ? ?  ? PT LONG TERM GOAL #1  ? Title Patient to demonstrate AROM Lt shoulder to 150-160 deg flexion; 75 degrees ER; 60 degrees IR   ? Time 16   ? Period Weeks   ? Status New   ? Target Date 12/06/21   ?  ? PT LONG TERM GOAL #2  ? Title Increase strength Lt shoulder to 4/5 for ER; IR; abduction   ? Time 16   ? Period Weeks   ? Status New   ? Target Date 12/06/21   ?  ? PT LONG TERM GOAL #3  ? Title Patient to report improved ability to reach overhead functionally   ? Time 16   ? Period Weeks   ? Status New   ? Target Date 12/06/21   ?  ? PT LONG TERM GOAL #4  ? Title Independent in [place]   ? Time 16   ? Period Weeks   ? Status New   ?  Target Date 12/06/21   ?  ? PT LONG  TERM GOAL #5  ? Title Improve functional limitation score to 57   ? Time 16   ? Period Weeks   ? Status New   ? Target Date 12/06/21   ? ?  ?  ? ?  ? ? ? ? ? ? ? ? Plan - 10/24/21 1354   ? ? Clinical Impression Statement Working on strength and ROM Lt UE per protocol. He continues to have muscular tightness in the anterior Lt shoulder - deltoids and biceps. Trial of DN to address muscular tightness.   ? Rehab Potential Good   ? PT Frequency 2x / week   ? PT Duration 12 weeks   ? PT Treatment/Interventions ADLs/Self Care Home Management;Aquatic Therapy;Cryotherapy;Electrical Stimulation;Iontophoresis 77m/ml Dexamethasone;Moist Heat;Ultrasound;Functional mobility training;Therapeutic activities;Therapeutic exercise;Neuromuscular re-education;Patient/family education;Manual techniques;Passive range of motion;Dry needling;Taping;Vasopneumatic Device   ? PT Next Visit Plan progress with ROM and strengthening as patient tolerates and per protocol; assess response to DN   ? [id number]  ? Consulted and Agree with Plan of Care Patient   ? ?  ?  ? ?  ? ? ?Patient will benefit from skilled therapeutic intervention in order to improve the following deficits and impairments:    ? ?Visit Diagnosis: ?Muscle weakness (generalized) ? ?Chronic left shoulder pain ? ?Other symptoms and signs involving the musculoskeletal system ? ?Abnormal posture ? ?Localized edema ? ? ? ? ?Problem List ?Patient Active Problem List  ? Diagnosis Date Noted  ? Greater trochanteric pain syndrome of left lower extremity 10/20/2019  ? ? ?CEverardo All PT, MPH  ?10/24/2021, 3:17 PM ? ?[place] ?[facility] ?[address] [address]?Phone: [phone]  Fax:  [phone]? ?Name: HJohnatan Baskette?MRN: [id number]?Date of Birth: 8Jan 25, 1957? ? ? ?

## 2021-10-31 ENCOUNTER — Ambulatory Visit: Payer: Medicare Other | Attending: Orthopedic Surgery | Admitting: Rehabilitative and Restorative Service Providers"

## 2021-10-31 ENCOUNTER — Encounter: Payer: Self-pay | Admitting: Rehabilitative and Restorative Service Providers"

## 2021-10-31 DIAGNOSIS — R293 Abnormal posture: Secondary | ICD-10-CM | POA: Insufficient documentation

## 2021-10-31 DIAGNOSIS — R29898 Other symptoms and signs involving the musculoskeletal system: Secondary | ICD-10-CM | POA: Diagnosis present

## 2021-10-31 DIAGNOSIS — M6281 Muscle weakness (generalized): Secondary | ICD-10-CM | POA: Diagnosis present

## 2021-10-31 DIAGNOSIS — M25512 Pain in left shoulder: Secondary | ICD-10-CM | POA: Insufficient documentation

## 2021-10-31 DIAGNOSIS — R600 Localized edema: Secondary | ICD-10-CM | POA: Insufficient documentation

## 2021-10-31 DIAGNOSIS — G8929 Other chronic pain: Secondary | ICD-10-CM | POA: Diagnosis present

## 2021-10-31 NOTE — Patient Instructions (Addendum)
IONTOPHORESIS PATIENT PRECAUTIONS & CONTRAINDICATIONS: ? ?Redness under one or both electrodes can occur.  This characterized by a uniform redness that usually disappears within 12 hours of treatment. ?Small pinhead size blisters may result in response to the drug.  Contact your physician if the problem persists more than 24 hours. ?On rare occasions, iontophoresis therapy can result in temporary skin reactions such as rash, inflammation, irritation or burns.  The skin reactions may be the result of individual sensitivity to the ionic solution used, the condition of the skin at the start of treatment, reaction to the materials in the electrodes, allergies or sensitivity to dexamethasone, or a poor connection between the patch and your skin.  Discontinue using iontophoresis if you have any of these reactions and report to your therapist. ?Remove the Patch? or electrodes if you have any undue sensation of pain or burning during the treatment and report discomfort to your therapist. ?Tell your Therapist if you have had known adverse reactions to the application of electrical current. ?If using the Patch?, the LED light will turn off when treatment is complete and the patch can be removed.  Approximate treatment time is 1-3 hours.  Remove the patch when light goes off or after 6 hours. ?The Patch? can be worn during normal activity, however excessive motion where the electrodes have been placed can cause poor contact between the skin and the electrode or uneven electrical current resulting in greater risk of skin irritation. ?Keep out of the reach of children. ? ? ?DO NOT use if you have a cardiac pacemaker or any other electrically sensitive implanted device. ?DO NOT use if you have a known sensitivity to dexamethasone. ?DO NOT use during Magnetic Resonance Imaging (MRI). ?DO NOT use over broken or compromised skin (e.g. sunburn, cuts, or acne) due to the increased risk of skin reaction. ?DO NOT SHAVE over the area to  be treated:  To establish good contact between the Patch? and the skin, excessive hair may be clipped. ?DO NOT place the Patch? or electrodes on or over your eyes, directly over your heart, or brain. ?DO NOT reuse the Patch? or electrodes as this may cause burns to occur. ?Access Code: [email or web address] ?URL: [email or web address] ?Date: 10/31/2021 ?Prepared by: Gillermo Murdoch ? ?Exercises ?- Circular Shoulder Pendulum with Table Support  - 3-4 x daily - 7 x weekly - 1 sets - 20-30 reps ?- Seated Shoulder Flexion Towel Slide at Table Top Full Range of Motion  - 2 x daily - 7 x weekly - 1 sets - 5-10 reps - 10sec  hold ?- Isometric Shoulder Abduction at Wall  - 2 x daily - 7 x weekly - 1 sets - 5-10 reps - 5 sec  hold ?- Isometric Shoulder External Rotation at Wall  - 2 x daily - 7 x weekly - 1 sets - 5 reps - 5 sec  hold ?- Standing Isometric Shoulder Internal Rotation with Towel Roll at Doorway  - 2 x daily - 7 x weekly - 1 sets - 5 reps - 5 sec  hold ?- Seated Shoulder Flexion AAROM with Pulley Behind  - 2 x daily - 7 x weekly - 1 sets - 10 reps - 10 sec  hold ?- Seated Shoulder Scaption AAROM with Pulley at Side  - 2 x daily - 7 x weekly - 1 sets - 10 reps - 10sec  hold ?- Standing Shoulder and Trunk Flexion at Table  - 2 x daily - 7 x weekly - 1  sets - 5 reps - 5 sec  hold ?- Seated Levator Scapulae Stretch  - 2 x daily - 7 x weekly - 3 reps - 30 hold ?- Shoulder Internal Rotation Reactive Isometrics  - 2 x daily - 7 x weekly - 1 sets - 10 reps - 3-5 sec  hold ?- Shoulder External Rotation Reactive Isometrics  - 2 x daily - 7 x weekly - 1 sets - 10 reps - 3 sec  hold ?- Prone Scapular Retraction  - 1 x daily - 7 x weekly - 1-2 sets - 5-10 reps - 3-5 sec  hold ?- Prone Shoulder Extension  - 1 x daily - 7 x weekly - 1-2 sets - 10 reps - 3 sec  hold ?- Prone Scapular Retraction and Row  - 1 x daily - 7 x weekly - 1-2 sets - 10 reps - 3 sec  hold ?- Sidelying Shoulder External Rotation  - 1 x daily - 7 x weekly - 1  sets - 10 reps - 3 sec  hold ?- Supine Shoulder Circles  - 1 x daily - 7 x weekly - 1-2 sets - 10 reps ?- Sidelying Shoulder Flexion 15 Degrees  - 1 x daily - 7 x weekly - 1-2 sets - 10 reps - 2-3 sec  hold ?- Shoulder Internal Rotation with Resistance  - 2 x daily - 7 x weekly - 1 sets - 3 reps - 30 sec  hold ?- Shoulder External Rotation with Anchored Resistance  - 2 x daily - 7 x weekly - 1-2 sets - 10 reps - 3 sec  hold ?- Standing Shoulder Row Reactive Isometric  - 2 x daily - 7 x weekly - 1 sets - 10 reps - 30-45 sec  hold ?- Standing Shoulder Flexion to 90 Degrees  - 2 x daily - 7 x weekly - 1 sets - 10 reps - 2-3 sec  hold ?- Supine Chest Stretch with Elbows Bent  - 2 x daily - 7 x weekly - 1 sets - 3-5 reps - 20-30 sec  hold ?- Wall Push Up  - 1 x daily - 7 x weekly - 1-3 sets - 10 reps - 3 sec  hold ?- Bicep Stretch at Table  - 2 x daily - 7 x weekly - 1 sets - 3-5 reps - 10 sec  hold ?

## 2021-10-31 NOTE — Therapy (Signed)
[place] ?[facility] ?[place] ?[place], [place], [address] ?Phone: [phone]   Fax:  [phone] ? ?Physical Therapy Treatment ? ?Patient Details  ?Name: Raymond Walsh ?MRN: [id number] ?Date of Birth: 1956/05/23 ?Referring Provider (PT): Dr [name] ? ? ?Encounter Date: 10/31/2021 ? ? PT End of Session - 10/31/21 1313   ? ? Visit Number 19   ? Number of Visits 32   ? Date for PT Re-Evaluation 12/06/21   ? Authorization - Visit Number 19   ? Progress Note Due on Visit 20   ? PT Start Time 1312   ? PT Stop Time 1400   ? PT Time Calculation (min) 48 min   ? Activity Tolerance Patient tolerated treatment well   ? ?  ?  ? ?  ? ? ?Past Medical History:  ?Diagnosis Date  ? A-fib ([place])   ? 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 Cancer [facility])   ? prostate  ? Dysrhythmia 08/2019  ? A-fib from energy medication  ? ? ?Past Surgical History:  ?Procedure Laterality Date  ? BACK SURGERY  2000  ? plate in Z7--H1  ? TOTAL SHOULDER ARTHROPLASTY Left 08/14/2021  ? Procedure: LEFT TOTAL SHOULDER ARTHROPLASTY;  Surgeon: [name], MD;  Location: [facility];  Service: Orthopedics;  Laterality: Left;  ? ? ?There were no vitals filed for this visit. ? ? Subjective Assessment - 10/31/21 1313   ? ? Subjective Still has pain at the top of the shoulder with lifting his arm. Can't tell that the DN helped.   ? Currently in Pain? No/denies   ? Pain Score 0-No pain   ? Pain Location Shoulder   ? Pain Orientation Left   ? Pain Descriptors / Indicators Sore;Tightness   ? Pain Type Surgical pain   ? Pain Onset More than a month ago   ? Pain Frequency Intermittent   ? ?  ?  ? ?  ? ? ? ? ? [facility] PT Assessment - 10/31/21 0001   ? ?  ? Assessment  ? Medical Diagnosis Lt TSA   ? Referring Provider (PT) Dr [name]   ? Onset Date/Surgical Date 08/14/21   ? Hand Dominance Right   ? Next MD Visit 10/02/21   ? Prior Therapy here for Lt shoulder pain   ?  ? Palpation  ? Palpation comment pain with palpatioin  anterior Lt shoulder at biceps tendon area /; some persistent muscular tightness Lt upper quarter - through the pecs; anterior deltiod; biceps - significant tightness noted in biceps tendon area and into biceps muscle   ? ?  ?  ? ?  ? ? ? ? ? ? ? ? ? ? ? ? ? ? ? ? [place] Adult PT Treatment/Exercise - 10/31/21 0001   ? ?  ? Shoulder Exercises: Prone  ? Retraction Strengthening;Both;10 reps   ? Retraction Limitations prone row 5 sec hold x 10 reps   ? Extension Strengthening;Left;10 reps   5 sec hold  ?  ? Shoulder Exercises: Pulleys  ? Flexion 1 minute   ? Flexion Limitations 10 second holds x 10 reps   ?  ? Shoulder Exercises: Therapy Ball  ? Flexion Both;10 reps   ? Flexion Limitations large orange ball on wall rolling up into flexion bilat UE's - on wal and repeated with ball on table   ?  ? Shoulder Exercises: ROM/Strengthening  ? Wall Pushups 10 reps   ?  ? Shoulder  Exercises: Isometric Strengthening  ? External Rotation 5X5"   ? Internal Rotation 5X5"   ? ABduction 5X5"   ?  ? Shoulder Exercises: Stretch  ? External Rotation Stretch 5 reps;10 seconds   2 sets; standing towel under arm using cane  ? Table Stretch -Flexion Limitations shoulder flexion step back - hands resting on counter - patient stepping back keeping weight on LE's stretching into flexion 10 sec hold x 5 reps   ? Other Shoulder Stretches very gentle biceps stretch 10 sec x 3 reps at table repeated x 2 reps at doorway   ?  ? Iontophoresis  ? Type of Iontophoresis Dexamethasone   ? Location ant left shoulder   ? Dose 1.0 ml   ? Time 8 hour patch   ?  ? Vasopneumatic  ? Number Minutes Vasopneumatic  10 minutes   ? Vasopnuematic Location  Shoulder   ? Vasopneumatic Pressure Low   ? Vasopneumatic Temperature  34   ? ?  ?  ? ?  ? ? ? ? ? ? ? ? ? ? PT Education - 10/31/21 1347   ? ? Education Details HEP; ionto   ? Person(s) Educated Patient   ? Methods Explanation;Demonstration;Tactile cues;Verbal cues;Handout   ? Comprehension Verbalized  understanding;Returned demonstration;Verbal cues required;Tactile cues required   ? ?  ?  ? ?  ? ? ? PT Short Term Goals - 09/17/21 1350   ? ?  ? PT SHORT TERM GOAL #1  ? Title Improve posture and alignment with patient to demonstrate improve upright posture with posterior shoulder girdle engaged   ? Status Partially Met   ?  ? PT SHORT TERM GOAL #2  ? Title Patient to demonstrate AROM Lt shoulder to 90-100 degrees; ER to 25 degrees in scapular plane   ? Status On-going   ?  ? PT SHORT TERM GOAL #3  ? Title Patient to demonstrate 3+/5 to 4/5 strength Lt shoulder in available range   ? Status On-going   ? ?  ?  ? ?  ? ? ? ? PT Long Term Goals - 08/16/21 1302   ? ?  ? PT LONG TERM GOAL #1  ? Title Patient to demonstrate AROM Lt shoulder to 150-160 deg flexion; 75 degrees ER; 60 degrees IR   ? Time 16   ? Period Weeks   ? Status New   ? Target Date 12/06/21   ?  ? PT LONG TERM GOAL #2  ? Title Increase strength Lt shoulder to 4/5 for ER; IR; abduction   ? Time 16   ? Period Weeks   ? Status New   ? Target Date 12/06/21   ?  ? PT LONG TERM GOAL #3  ? Title Patient to report improved ability to reach overhead functionally   ? Time 16   ? Period Weeks   ? Status New   ? Target Date 12/06/21   ?  ? PT LONG TERM GOAL #4  ? Title Independent in [place]   ? Time 16   ? Period Weeks   ? Status New   ? Target Date 12/06/21   ?  ? PT LONG TERM GOAL #5  ? Title Improve functional limitation score to 57   ? Time 16   ? Period Weeks   ? Status New   ? Target Date 12/06/21   ? ?  ?  ? ?  ? ? ? ? ? ? ? ? Plan -  10/31/21 1318   ? ? Clinical Impression Statement Continued pain in Lt upper shoulder/arm area with certain movements. Painful arc with flexion. Continued tightness in the anterior shoulder through the deltiods and biceps. Trial of very gentle biceps stretch 10 sec hold x 3 reps with decreased pain in the anterior shoulder with AROM/exercises following gentle stretching. Trial of ionto to anterior shoulder at area of pain with  palpation.   ? Rehab Potential Good   ? PT Frequency 2x / week   ? PT Duration 12 weeks   ? PT Treatment/Interventions ADLs/Self Care Home Management;Aquatic Therapy;Cryotherapy;Electrical Stimulation;Iontophoresis 84m/ml Dexamethasone;Moist Heat;Ultrasound;Functional mobility training;Therapeutic activities;Therapeutic exercise;Neuromuscular re-education;Patient/family education;Manual techniques;Passive range of motion;Dry needling;Taping;Vasopneumatic Device   ? PT Next Visit Plan progress with ROM and strengthening as patient tolerates and per protocol; assess response to DN   ? [id number]  ? Consulted and Agree with Plan of Care Patient   ? ?  ?  ? ?  ? ? ?Patient will benefit from skilled therapeutic intervention in order to improve the following deficits and impairments:    ? ?Visit Diagnosis: ?Muscle weakness (generalized) ? ?Chronic left shoulder pain ? ?Other symptoms and signs involving the musculoskeletal system ? ?Abnormal posture ? ?Localized edema ? ? ? ? ?Problem List ?Patient Active Problem List  ? Diagnosis Date Noted  ? Greater trochanteric pain syndrome of left lower extremity 10/20/2019  ? ? ?CEverardo Walsh PT, MPH  ?10/31/2021, 2:12 PM ? ?[place] ?[facility] ?[address]?[place] [address]?Phone: [phone]  Fax:  [phone]? ?Name: Raymond Walsh?MRN: [id number]?Date of Birth: 8Jan 29, 1957? ? ? ?

## 2021-11-07 ENCOUNTER — Ambulatory Visit: Payer: Medicare Other | Admitting: Rehabilitative and Restorative Service Providers"

## 2021-11-07 ENCOUNTER — Encounter: Payer: Self-pay | Admitting: Rehabilitative and Restorative Service Providers"

## 2021-11-07 DIAGNOSIS — R293 Abnormal posture: Secondary | ICD-10-CM

## 2021-11-07 DIAGNOSIS — M6281 Muscle weakness (generalized): Secondary | ICD-10-CM | POA: Diagnosis not present

## 2021-11-07 DIAGNOSIS — G8929 Other chronic pain: Secondary | ICD-10-CM

## 2021-11-07 DIAGNOSIS — R29898 Other symptoms and signs involving the musculoskeletal system: Secondary | ICD-10-CM

## 2021-11-07 NOTE — Therapy (Addendum)
[place] ?[facility] ?[place] ?[place], [place], [address] ?Phone: [phone]   Fax:  [phone] ? ?Physical Therapy Treatment and !0th Visit Medicare Note ? ?Progress Note ?Reporting Period 09/17/21 to 11/07/21 ? ?See note below for Objective Data and Assessment of Progress/Goals.  ? ?  ? ?Patient Details  ?Name: Osamu Olguin ?MRN: [id number] ?Date of Birth: [birth year] ?Referring Provider (PT): Dr [name] ? ? ?Encounter Date: 11/07/2021 ? ? PT End of Session - 11/07/21 1320   ? ? Visit Number 20   ? Number of Visits 32   ? Date for PT Re-Evaluation 12/06/21   ? Authorization - Visit Number 20   ? Progress Note Due on Visit 20   ? PT Start Time 1318   ? PT Stop Time 1406   ? PT Time Calculation (min) 48 min   ? Activity Tolerance Patient tolerated treatment well   ? ?  ?  ? ?  ? ? ?Past Medical History:  ?Diagnosis Date  ? A-fib ([place])   ? 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 Cancer [facility])   ? prostate  ? Dysrhythmia 08/2019  ? A-fib from energy medication  ? ? ?Past Surgical History:  ?Procedure Laterality Date  ? BACK SURGERY  2000  ? plate in J8--I3  ? TOTAL SHOULDER ARTHROPLASTY Left 08/14/2021  ? Procedure: LEFT TOTAL SHOULDER ARTHROPLASTY;  Surgeon: [name], MD;  Location: [facility];  Service: Orthopedics;  Laterality: Left;  ? ? ?There were no vitals filed for this visit. ? ? Subjective Assessment - 11/07/21 1321   ? ? Subjective Shoulder is getting a little better. Thinks he was over doing exercises so he took a day off and shoulder felt much better.   ? Currently in Pain? No/denies   ? Pain Score 0-No pain   ? Pain Location Shoulder   ? Pain Orientation Left   ? Pain Descriptors / Indicators Sore;Tightness   ? Pain Type Chronic pain   ? Pain Onset More than a month ago   ? Pain Frequency Intermittent   ? ?  ?  ? ?  ? ? ? ? ? [facility] PT Assessment - 11/07/21 0001   ? ?  ? Assessment  ? Medical Diagnosis Lt TSA   ? Referring Provider (PT) Dr [name]   ? Onset  Date/Surgical Date 08/14/21   ? Hand Dominance Right   ? Next MD Visit 10/02/21   ? Prior Therapy here for Lt shoulder pain   ?  ? AROM  ? Right Shoulder Extension 62 Degrees   ? Right Shoulder Flexion 140 Degrees   ? Right Shoulder ABduction 92 Degrees   ? Right Shoulder Internal Rotation --   thumb T10  ? Right Shoulder External Rotation 90 Degrees   shoulder 90/90  ? Left Shoulder Flexion 137 Degrees   ? Left Shoulder ABduction 135 Degrees   scaption  ? Left Shoulder External Rotation 24 Degrees   elbow at side  ?  ? PROM  ? Left Shoulder Flexion 144 Degrees   ? Left Shoulder ABduction 139 Degrees   in scapular plane  ? Left Shoulder Internal Rotation 70 Degrees   shoulder 90 deg abd  ? Left Shoulder External Rotation 30 Degrees   shoulder 90 deg abd  ?  ? Palpation  ? Palpation comment pain with palpation anterior Lt shoulder at biceps tendon area; some persistent muscular tightness Lt upper quarter - through the pecs; anterior  deltiod; biceps  tightness noted in biceps tendon area and into biceps muscle   ? ?  ?  ? ?  ? ? ? ? ? ? ? ? ? ? ? ? ? ? ? ? [place] Adult PT Treatment/Exercise - 11/07/21 0001   ? ?  ? Shoulder Exercises: Sidelying  ? External Rotation AROM;Strengthening;Left;20 reps   ? External Rotation Weight (lbs) 1   ? Flexion AAROM;Strengthening;Left;10 reps   ?  ? Shoulder Exercises: Standing  ? External Rotation Strengthening;Left;20 reps   ? Theraband Level (Shoulder External Rotation) Level 2 (Red)   ? External Rotation Limitations reactive isometric   ? Internal Rotation Strengthening;Left;10 reps;Theraband   ? Theraband Level (Shoulder Internal Rotation) Level 2 (Red)   ? Internal Rotation Limitations reactive isometric   ? Flexion AROM;AAROM;Strengthening;Left;20 reps   ? Shoulder Flexion Weight (lbs) hand on pillowcase sliding hand up wall   ? Flexion Limitations AROM to 90 deg VC to pull shoulders down and back   ?  ? Shoulder Exercises: Pulleys  ? Flexion 1 minute   ? Flexion Limitations 10  second holds x 10 reps   ?  ? Shoulder Exercises: ROM/Strengthening  ? Wall Pushups 10 reps   ?  ? Shoulder Exercises: Stretch  ? External Rotation Stretch 5 reps;10 seconds   2 sets; standing towel under arm using cane  ? Other Shoulder Stretches biceps stretch 10 sec x 3 reps at table repeated x 2 reps at doorway   ?  ? Vasopneumatic  ? Number Minutes Vasopneumatic  10 minutes   ? Vasopnuematic Location  Shoulder   ? Vasopneumatic Pressure Low   ? Vasopneumatic Temperature  34   ? ?  ?  ? ?  ? ? ? ? ? ? ? ? ? ? ? ? PT Short Term Goals - 09/17/21 1350   ? ?  ? PT SHORT TERM GOAL #1  ? Title Improve posture and alignment with patient to demonstrate improve upright posture with posterior shoulder girdle engaged   ? Status Partially Met   ?  ? PT SHORT TERM GOAL #2  ? Title Patient to demonstrate AROM Lt shoulder to 90-100 degrees; ER to 25 degrees in scapular plane   ? Status On-going   ?  ? PT SHORT TERM GOAL #3  ? Title Patient to demonstrate 3+/5 to 4/5 strength Lt shoulder in available range   ? Status On-going   ? ?  ?  ? ?  ? ? ? ? PT Long Term Goals - 08/16/21 1302   ? ?  ? PT LONG TERM GOAL #1  ? Title Patient to demonstrate AROM Lt shoulder to 150-160 deg flexion; 75 degrees ER; 60 degrees IR   ? Time 16   ? Period Weeks   ? Status New   ? Target Date 12/06/21   ?  ? PT LONG TERM GOAL #2  ? Title Increase strength Lt shoulder to 4/5 for ER; IR; abduction   ? Time 16   ? Period Weeks   ? Status New   ? Target Date 12/06/21   ?  ? PT LONG TERM GOAL #3  ? Title Patient to report improved ability to reach overhead functionally   ? Time 16   ? Period Weeks   ? Status New   ? Target Date 12/06/21   ?  ? PT LONG TERM GOAL #4  ? Title Independent in [place]   ? Time 16   ?  Period Weeks   ? Status New   ? Target Date 12/06/21   ?  ? PT LONG TERM GOAL #5  ? Title Improve functional limitation score to 57   ? Time 16   ? Period Weeks   ? Status New   ? Target Date 12/06/21   ? ?  ?  ? ?  ? ? ? ? ? ? ? ? Plan - 11/07/21  1352   ? ? Clinical Impression Statement Continued pain in the Lt shoulder with ER actively and passively. Good progress with remainder of AROM and PROM Lt shoulder. Progressing gradually toward stated goals of therapy.   ? Rehab Potential Good   ? PT Frequency 2x / week   ? PT Duration 12 weeks   ? PT Treatment/Interventions ADLs/Self Care Home Management;Aquatic Therapy;Cryotherapy;Electrical Stimulation;Iontophoresis 60m/ml Dexamethasone;Moist Heat;Ultrasound;Functional mobility training;Therapeutic activities;Therapeutic exercise;Neuromuscular re-education;Patient/family education;Manual techniques;Passive range of motion;Dry needling;Taping;Vasopneumatic Device   ? PT Next Visit Plan progress with ROM and strengthening as patient tolerates and per protocol   ? [id number]  ? Consulted and Agree with Plan of Care Patient   ? ?  ?  ? ?  ? ? ?Patient will benefit from skilled therapeutic intervention in order to improve the following deficits and impairments:    ? ?Visit Diagnosis: ?Muscle weakness (generalized) ? ?Chronic left shoulder pain ? ?Other symptoms and signs involving the musculoskeletal system ? ?Abnormal posture ? ? ? ? ?Problem List ?Patient Active Problem List  ? Diagnosis Date Noted  ? Greater trochanteric pain syndrome of left lower extremity 10/20/2019  ? ? ?CEverardo All PT, MPH  ?11/07/2021, 3:38 PM ? ?[place] ?[facility] ?[address]?[place] [address]?Phone: [phone]  Fax:  [phone]? ?Name: HMerlen Gurry?MRN: [id number]?Date of Birth: 810-19-1957? ? ? ?

## 2021-11-14 ENCOUNTER — Encounter: Payer: Self-pay | Admitting: Rehabilitative and Restorative Service Providers"

## 2021-11-14 ENCOUNTER — Ambulatory Visit: Payer: Medicare Other | Admitting: Rehabilitative and Restorative Service Providers"

## 2021-11-14 DIAGNOSIS — M6281 Muscle weakness (generalized): Secondary | ICD-10-CM | POA: Diagnosis not present

## 2021-11-14 DIAGNOSIS — R293 Abnormal posture: Secondary | ICD-10-CM

## 2021-11-14 DIAGNOSIS — R600 Localized edema: Secondary | ICD-10-CM

## 2021-11-14 DIAGNOSIS — G8929 Other chronic pain: Secondary | ICD-10-CM

## 2021-11-14 DIAGNOSIS — R29898 Other symptoms and signs involving the musculoskeletal system: Secondary | ICD-10-CM

## 2021-11-14 NOTE — Therapy (Signed)
[place] ?[facility] ?[place] ?[place], [place], [address] ?Phone: [phone]   Fax:  [phone] ? ?Physical Therapy Treatment ? ?Patient Details  ?Name: Raymond Walsh ?MRN: [id number] ?Date of Birth: May 28, 1956 ?Referring Provider (PT): Dr [name] ? ? ?Encounter Date: 11/14/2021 ? ? PT End of Session - 11/14/21 1319   ? ? Visit Number 21   ? Number of Visits 32   ? Date for PT Re-Evaluation 12/06/21   ? Authorization - Visit Number 21   ? Progress Note Due on Visit 30   ? PT Start Time 9892   ? PT Stop Time 1405   ? PT Time Calculation (min) 48 min   ? Activity Tolerance Patient tolerated treatment well   ? ?  ?  ? ?  ? ? ?Past Medical History:  ?Diagnosis Date  ? A-fib ([place])   ? 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 Cancer [facility])   ? prostate  ? Dysrhythmia 08/2019  ? A-fib from energy medication  ? ? ?Past Surgical History:  ?Procedure Laterality Date  ? BACK SURGERY  2000  ? plate in J1--H4  ? TOTAL SHOULDER ARTHROPLASTY Left 08/14/2021  ? Procedure: LEFT TOTAL SHOULDER ARTHROPLASTY;  Surgeon: [name], MD;  Location: [facility];  Service: Orthopedics;  Laterality: Left;  ? ? ?There were no vitals filed for this visit. ? ? Subjective Assessment - 11/14/21 1320   ? ? Subjective Can reach out to close the car door with Lt UE and can use the Lt UE to wash his head/hair. Still has pain with reaching out to the side.   ? Currently in Pain? No/denies   ? Pain Score 0-No pain   ? Pain Location Shoulder   ? Pain Orientation Left   ? ?  ?  ? ?  ? ? ? ? ? [facility] PT Assessment - 11/14/21 0001   ? ?  ? Assessment  ? Medical Diagnosis Lt TSA   ? Referring Provider (PT) Dr [name]   ? Onset Date/Surgical Date 08/14/21   ? Hand Dominance Right   ? Next MD Visit 11/15/21   ? Prior Therapy here for Lt shoulder pain   ?  ? AROM  ? Right Shoulder Extension 62 Degrees   ? Right Shoulder Flexion 140 Degrees   ? Right Shoulder ABduction 92 Degrees   ? Right Shoulder Internal Rotation  --   thumb T10  ? Right Shoulder External Rotation 90 Degrees   shoulder 90/90  ? Left Shoulder Flexion 137 Degrees   ? Left Shoulder ABduction 135 Degrees   scaption  ? Left Shoulder External Rotation 34 Degrees   elbow at side  ?  ? PROM  ? Left Shoulder Flexion 144 Degrees   ? Left Shoulder ABduction 139 Degrees   in scapular plane  ? Left Shoulder Internal Rotation 70 Degrees   shoulder 90 deg abd  ? Left Shoulder External Rotation 36 Degrees   shoulder 90 deg abd  ?  ? Palpation  ? Palpation comment pain with palpation anterior Lt shoulder at biceps tendon area; some persistent muscular tightness Lt upper quarter - through the pecs; anterior deltiod; biceps  tightness noted in biceps tendon area and into biceps muscle   ? ?  ?  ? ?  ? ? ? ? ? ? ? ? ? ? ? ? ? ? ? ? [place] Adult PT Treatment/Exercise - 11/14/21 0001   ? ?  ?  Neuro Re-ed   ? Neuro Re-ed Details  working on activation of deltoid Lt in varying positions and ranges   ?  ? Shoulder Exercises: Supine  ? Other Supine Exercises triceps extension AROM; yellow TB x 10; 1# wt x 30 reps   ?  ? Shoulder Exercises: Seated  ? Abduction Strengthening;Left;20 reps   ? ABduction Limitations elbow flexed - working on deltoid   ?  ? Shoulder Exercises: Prone  ? Retraction Strengthening;Both;10 reps   ? Retraction Limitations prone row 5 sec hold x 10 reps   ? Extension Strengthening;Left;10 reps   ? Extension Weight (lbs) 1   ? Extension Limitations added triceps kick back x 10 with 1# wt   ?  ? Shoulder Exercises: Sidelying  ? External Rotation Strengthening;Left;20 reps   ? External Rotation Weight (lbs) 1   ? ABduction Strengthening;Left;10 reps   ? ABduction Weight (lbs) 1   ? ABduction Limitations through pain free range   ?  ? Shoulder Exercises: Standing  ? External Rotation Strengthening;Left;20 reps   ? Theraband Level (Shoulder External Rotation) Level 2 (Red)   ? External Rotation Limitations reactive isometric   ? Internal Rotation Strengthening;Left;10  reps;Theraband   ? Theraband Level (Shoulder Internal Rotation) Level 2 (Red)   ? Internal Rotation Limitations reactive isometric   ? Flexion AROM;AAROM;Strengthening;Left;20 reps   ? Shoulder Flexion Weight (lbs) hand on pillowcase sliding hand up wall   ? Flexion Limitations AROM to 90 deg VC to pull shoulders down and back   ? Row Strengthening;Both;20 reps;Theraband   ? Theraband Level (Shoulder Row) Level 4 (Blue)   ? Row Limitations step back isometric   ?  ? Shoulder Exercises: Pulleys  ? Flexion 1 minute   ? Flexion Limitations 10 second holds x 10 reps   ?  ? Vasopneumatic  ? Number Minutes Vasopneumatic  10 minutes   ? Vasopnuematic Location  Shoulder   ? Vasopneumatic Pressure Low   ? Vasopneumatic Temperature  34   ? ?  ?  ? ?  ? ? ? ? ? ? ? ? ? ? ? ? PT Short Term Goals - 09/17/21 1350   ? ?  ? PT SHORT TERM GOAL #1  ? Title Improve posture and alignment with patient to demonstrate improve upright posture with posterior shoulder girdle engaged   ? Status Partially Met   ?  ? PT SHORT TERM GOAL #2  ? Title Patient to demonstrate AROM Lt shoulder to 90-100 degrees; ER to 25 degrees in scapular plane   ? Status On-going   ?  ? PT SHORT TERM GOAL #3  ? Title Patient to demonstrate 3+/5 to 4/5 strength Lt shoulder in available range   ? Status On-going   ? ?  ?  ? ?  ? ? ? ? PT Long Term Goals - 08/16/21 1302   ? ?  ? PT LONG TERM GOAL #1  ? Title Patient to demonstrate AROM Lt shoulder to 150-160 deg flexion; 75 degrees ER; 60 degrees IR   ? Time 16   ? Period Weeks   ? Status New   ? Target Date 12/06/21   ?  ? PT LONG TERM GOAL #2  ? Title Increase strength Lt shoulder to 4/5 for ER; IR; abduction   ? Time 16   ? Period Weeks   ? Status New   ? Target Date 12/06/21   ?  ? PT LONG TERM GOAL #3  ?  Title Patient to report improved ability to reach overhead functionally   ? Time 16   ? Period Weeks   ? Status New   ? Target Date 12/06/21   ?  ? PT LONG TERM GOAL #4  ? Title Independent in [place]   ? Time 16   ?  Period Weeks   ? Status New   ? Target Date 12/06/21   ?  ? PT LONG TERM GOAL #5  ? Title Improve functional limitation score to 57   ? Time 16   ? Period Weeks   ? Status New   ? Target Date 12/06/21   ? ?  ?  ? ?  ? ? ? ? ? ? ? ? Plan - 11/14/21 1322   ? ? Clinical Impression Statement Continued pain in the Lt shoulder with active and passive ER. Progressing with ROM and strengthening Lt UE. Functional activities are improving.   ? Rehab Potential Good   ? PT Frequency 2x / week   ? PT Duration 12 weeks   ? PT Treatment/Interventions ADLs/Self Care Home Management;Aquatic Therapy;Cryotherapy;Electrical Stimulation;Iontophoresis 58m/ml Dexamethasone;Moist Heat;Ultrasound;Functional mobility training;Therapeutic activities;Therapeutic exercise;Neuromuscular re-education;Patient/family education;Manual techniques;Passive range of motion;Dry needling;Taping;Vasopneumatic Device   ? PT Next Visit Plan progress with ROM and strengthening as patient tolerates and per protocol   ? PLakeland [id number]  ? Consulted and Agree with Plan of Care Patient   ? ?  ?  ? ?  ? ? ?Patient will benefit from skilled therapeutic intervention in order to improve the following deficits and impairments:    ? ?Visit Diagnosis: ?Muscle weakness (generalized) ? ?Chronic left shoulder pain ? ?Other symptoms and signs involving the musculoskeletal system ? ?Abnormal posture ? ?Localized edema ? ? ? ? ?Problem List ?Patient Active Problem List  ? Diagnosis Date Noted  ? Greater trochanteric pain syndrome of left lower extremity 10/20/2019  ? ? ?CEverardo All PT, MPH  ?11/14/2021, 2:08 PM ? ?[place] ?[facility] ?[address]?[place] [address]?Phone: [phone]  Fax:  [phone]? ?Name: HPershing Skidmore?MRN: [id number]?Date of Birth: 803-08-1955? ? ? ?

## 2021-11-21 ENCOUNTER — Encounter: Payer: Self-pay | Admitting: Rehabilitative and Restorative Service Providers"

## 2021-11-21 ENCOUNTER — Ambulatory Visit: Payer: Medicare Other | Admitting: Rehabilitative and Restorative Service Providers"

## 2021-11-21 DIAGNOSIS — R29898 Other symptoms and signs involving the musculoskeletal system: Secondary | ICD-10-CM

## 2021-11-21 DIAGNOSIS — M6281 Muscle weakness (generalized): Secondary | ICD-10-CM

## 2021-11-21 DIAGNOSIS — G8929 Other chronic pain: Secondary | ICD-10-CM

## 2021-11-21 DIAGNOSIS — R293 Abnormal posture: Secondary | ICD-10-CM

## 2021-11-21 NOTE — Patient Instructions (Signed)
Access Code: [email or web address] URL: [email or web address] Date: 11/21/2021 Prepared by: Gillermo Murdoch  Exercises - Isometric Shoulder Abduction at Wall  - 2 x daily - 7 x weekly - 1 sets - 5-10 reps - 5 sec  hold - Isometric Shoulder External Rotation at Wall  - 2 x daily - 7 x weekly - 1 sets - 5 reps - 5 sec  hold - Standing Isometric Shoulder Internal Rotation with Towel Roll at Doorway  - 2 x daily - 7 x weekly - 1 sets - 5 reps - 5 sec  hold - Seated Shoulder Flexion AAROM with Pulley Behind  - 2 x daily - 7 x weekly - 1 sets - 10 reps - 10 sec  hold - Seated Shoulder Scaption AAROM with Pulley at Side  - 2 x daily - 7 x weekly - 1 sets - 10 reps - 10sec  hold - Standing Shoulder and Trunk Flexion at Table  - 2 x daily - 7 x weekly - 1 sets - 5 reps - 5 sec  hold - Seated Levator Scapulae Stretch  - 2 x daily - 7 x weekly - 3 reps - 30 hold - Shoulder Internal Rotation Reactive Isometrics  - 2 x daily - 7 x weekly - 1 sets - 10 reps - 3-5 sec  hold - Shoulder External Rotation Reactive Isometrics  - 2 x daily - 7 x weekly - 1 sets - 10 reps - 3 sec  hold - Prone Scapular Retraction  - 1 x daily - 7 x weekly - 1-2 sets - 5-10 reps - 3-5 sec  hold - Prone Shoulder Extension  - 1 x daily - 7 x weekly - 1-2 sets - 10 reps - 3 sec  hold - Prone Scapular Retraction and Row  - 1 x daily - 7 x weekly - 1-2 sets - 10 reps - 3 sec  hold - Sidelying Shoulder External Rotation  - 1 x daily - 7 x weekly - 1 sets - 10 reps - 3 sec  hold - Supine Shoulder Circles  - 1 x daily - 7 x weekly - 1-2 sets - 10 reps - Sidelying Shoulder Flexion 15 Degrees  - 1 x daily - 7 x weekly - 1-2 sets - 10 reps - 2-3 sec  hold - Shoulder Internal Rotation with Resistance  - 2 x daily - 7 x weekly - 1 sets - 3 reps - 30 sec  hold - Shoulder External Rotation with Anchored Resistance  - 2 x daily - 7 x weekly - 1-2 sets - 10 reps - 3 sec  hold - Standing Shoulder Row Reactive Isometric  - 2 x daily - 7 x weekly - 1 sets  - 10 reps - 30-45 sec  hold - Standing Shoulder Flexion to 90 Degrees  - 2 x daily - 7 x weekly - 1 sets - 10 reps - 2-3 sec  hold - Supine Chest Stretch with Elbows Bent  - 2 x daily - 7 x weekly - 1 sets - 3-5 reps - 20-30 sec  hold - Wall Push Up  - 1 x daily - 7 x weekly - 1-3 sets - 10 reps - 3 sec  hold - Bicep Stretch at Table  - 2 x daily - 7 x weekly - 1 sets - 3-5 reps - 10 sec  hold - Anti-Rotation Lateral Stepping with Press  - 2 x daily - 7 x weekly - 1-2 sets - 10  reps - 2-3 sec  hold - Shoulder External Rotation in Abduction with Anchored Resistance  - 1 x daily - 7 x weekly - 1-3 sets - 10 reps - 3 sec  hold - Shoulder PNF D2 with Resistance  - 1 x daily - 7 x weekly - 1-2 sets - 10 reps - 2 sec  hold - Plank on Counter  - 2 x daily - 7 x weekly - 1 sets - 3 reps - 30 sec  hold

## 2021-11-21 NOTE — Therapy (Signed)
[place], [place], [address] Phone: [phone]   Fax:  [phone]  Physical Therapy Treatment  Rationale for Evaluation and Treatment Rehabilitation  Patient Details  Name: Raymond Walsh MRN: [id number] Date of Birth: 14-Apr-1956 Referring Provider (PT): Dr [name]   Encounter Date: 11/21/2021   PT End of Session - 11/21/21 1318     Visit Number 22    Number of Visits 32    Date for PT Re-Evaluation 12/06/21    Authorization - Visit Number 22    Progress Note Due on Visit 30    PT Start Time 0998    PT Stop Time 1405    PT Time Calculation (min) 48 min    Activity Tolerance Patient tolerated treatment well             Past Medical History:  Diagnosis Date   A-fib [facility])    Arthritis    hands,   Cancer ([place])    prostate   Dysrhythmia 08/2019   A-fib from energy medication    Past Surgical History:  Procedure Laterality Date   BACK SURGERY  2000   plate in P3--A2   TOTAL SHOULDER 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   Subjective Assessment - 11/21/21 1323     Subjective Pain with reaching over head and bringing hand to head. Has some pain when reaching out to side (ER).  He saw the surgeon last week and MD was pleased with progress with shoulder.    Currently in Pain? No/denies    Pain Score 0-No pain    Pain Location Shoulder    Pain Orientation Left    Pain Descriptors / Indicators Sharp   with certain movements   Pain Type Chronic pain                [facility] PT Assessment - 11/21/21 0001       Assessment   Medical Diagnosis Lt TSA    Referring Provider (PT) Dr [name]    Onset Date/Surgical Date 08/14/21    Hand Dominance Right    Next MD Visit 11/15/21    Prior Therapy  here for Lt shoulder pain      Observation/Other Assessments   Focus on Therapeutic Outcomes (FOTO)  33                           [place] [facility] PT Treatment/Exercise - 11/21/21 0001       Shoulder Exercises: Supine   External Rotation Strengthening;Left;20 reps;Theraband    Theraband Level (Shoulder External Rotation) Level 1 (Yellow)    External Rotation Limitations Lt elbow elevated on pillow to improve scapular plane    Other Supine Exercises PNF D1 yellow TB x 15 each side      Shoulder Exercises: Seated   External Rotation Strengthening;Left;10 reps;Theraband    Theraband Level (Shoulder External Rotation) Level 1 (Yellow)    External Rotation Limitations elbow propped ~ 30-35 deg abduction from side      Shoulder Exercises: Sidelying   External Rotation Strengthening;Left;20 reps;Theraband    Theraband Level (Shoulder External Rotation) Level 1 (Yellow)      Shoulder Exercises: Standing   External Rotation Strengthening;Left;20 reps    Theraband Level (Shoulder External Rotation)  Level 2 (Red)    Internal Rotation Strengthening;Left;10 reps;Theraband    Theraband Level (Shoulder Internal Rotation) Level 2 (Red)    Row Strengthening;Both;20 reps;Theraband    Theraband Level (Shoulder Row) Level 4 (Blue)    Other Standing Exercises bow and arrow green B x 10 each side    Other Standing Exercises antirotatin green TB push pull; shd flex/ext; shd circles CW/CCW arms outstretched; figure 8 arms outstretched x 10 each      Shoulder Exercises: ROM/Strengthening   Wall Pushups 10 reps    Modified Plank 30 seconds;3 reps    Modified Plank Limitations counter plank      Shoulder Exercises: Body Blade   Flexion 30 seconds;2 reps    Flexion Limitations bilat    Other Body Blade Exercises waist to overhead bilat UE's x 5 reps                     PT Education - 11/21/21 1405     Education Details HEP    Person(s) Educated Patient    Methods  Explanation;Demonstration;Tactile cues;Verbal cues;Handout    Comprehension Verbalized understanding;Returned demonstration;Verbal cues required;Tactile cues required              PT Short Term Goals - 09/17/21 1350       PT SHORT TERM GOAL #1   Title Improve posture and alignment with patient to demonstrate improve upright posture with posterior shoulder girdle engaged    Status Partially Met      PT SHORT TERM GOAL #2   Title Patient to demonstrate AROM Lt shoulder to 90-100 degrees; ER to 25 degrees in scapular plane    Status On-going      PT SHORT TERM GOAL #3   Title Patient to demonstrate 3+/5 to 4/5 strength Lt shoulder in available range    Status On-going               PT Long Term Goals - 08/16/21 1302       PT LONG TERM GOAL #1   Title Patient to demonstrate AROM Lt shoulder to 150-160 deg flexion; 75 degrees ER; 60 degrees IR    Time 16    Period Weeks    Status New    Target Date 12/06/21      PT LONG TERM GOAL #2   Title Increase strength Lt shoulder to 4/5 for ER; IR; abduction    Time 16    Period Weeks    Status New    Target Date 12/06/21      PT LONG TERM GOAL #3   Title Patient to report improved ability to reach overhead functionally    Time 16    Period Weeks    Status New    Target Date 12/06/21      PT LONG TERM GOAL #4   Title Independent in HEP    Time 16    Period Weeks    Status New    Target Date 12/06/21      PT LONG TERM GOAL #5   Title Improve functional limitation score to 57    Time 16    Period Weeks    Status New    Target Date 12/06/21                   Plan - 11/21/21 1327     Clinical Impression Statement Continued progress with AROM and strengtheing. Patient has pain with elevation and abduction bringing hand  to head and with isolated ER. painful with palpation anterior shoulder at biceps tendon area. Note increased bulge of biceps Lt UE. Improving gradually AROM and strengthLt UE. Marland Kitchen    Rehab  Potential Good    PT Frequency 2x / week    PT Duration 12 weeks    PT Treatment/Interventions ADLs/Self Care Home Management;Aquatic Therapy;Cryotherapy;Electrical Stimulation;Iontophoresis 69m/ml Dexamethasone;Moist Heat;Ultrasound;Functional mobility training;Therapeutic activities;Therapeutic exercise;Neuromuscular re-education;Patient/family education;Manual techniques;Passive range of motion;Dry needling;Taping;Vasopneumatic Device    PT Next Visit Plan progress with ROM and strengthening as patient tolerates and per protocol    PT Home Exercise Plan [id number]   Consulted and Agree with Plan of Care Patient             Patient will benefit from skilled therapeutic intervention in order to improve the following deficits and impairments:     Visit Diagnosis: Muscle weakness (generalized)  Chronic left shoulder pain  Other symptoms and signs involving the musculoskeletal system  Abnormal posture     Problem List Patient Active Problem List   Diagnosis Date Noted   Greater trochanteric pain syndrome of left lower extremity 10/20/2019    Lejuan Botto PNilda Simmer PT, MPH  11/21/2021, 3:22 PM  [facility] [address]Phone: [phone]  Fax:  [phone] Name: HShamari LofquistMRN: [id number] of Birth: [birth year]

## 2021-11-28 ENCOUNTER — Encounter: Payer: Self-pay | Admitting: Rehabilitative and Restorative Service Providers"

## 2021-11-28 ENCOUNTER — Ambulatory Visit: Payer: Medicare Other | Admitting: Rehabilitative and Restorative Service Providers"

## 2021-11-28 DIAGNOSIS — R29898 Other symptoms and signs involving the musculoskeletal system: Secondary | ICD-10-CM

## 2021-11-28 DIAGNOSIS — R293 Abnormal posture: Secondary | ICD-10-CM

## 2021-11-28 DIAGNOSIS — M6281 Muscle weakness (generalized): Secondary | ICD-10-CM | POA: Diagnosis not present

## 2021-11-28 DIAGNOSIS — G8929 Other chronic pain: Secondary | ICD-10-CM

## 2021-11-28 NOTE — Patient Instructions (Signed)
Access Code: [email or web address] URL: [email or web address] Date: 11/28/2021 Prepared by: Gillermo Murdoch  Exercises - Isometric Shoulder Abduction at Wall  - 2 x daily - 7 x weekly - 1 sets - 5-10 reps - 5 sec  hold - Isometric Shoulder External Rotation at Wall  - 2 x daily - 7 x weekly - 1 sets - 5 reps - 5 sec  hold - Standing Isometric Shoulder Internal Rotation with Towel Roll at Doorway  - 2 x daily - 7 x weekly - 1 sets - 5 reps - 5 sec  hold - Seated Shoulder Flexion AAROM with Pulley Behind  - 2 x daily - 7 x weekly - 1 sets - 10 reps - 10 sec  hold - Seated Shoulder Scaption AAROM with Pulley at Side  - 2 x daily - 7 x weekly - 1 sets - 10 reps - 10sec  hold - Standing Shoulder and Trunk Flexion at Table  - 2 x daily - 7 x weekly - 1 sets - 5 reps - 5 sec  hold - Seated Levator Scapulae Stretch  - 2 x daily - 7 x weekly - 3 reps - 30 hold - Shoulder Internal Rotation Reactive Isometrics  - 2 x daily - 7 x weekly - 1 sets - 10 reps - 3-5 sec  hold - Shoulder External Rotation Reactive Isometrics  - 2 x daily - 7 x weekly - 1 sets - 10 reps - 3 sec  hold - Prone Scapular Retraction  - 1 x daily - 7 x weekly - 1-2 sets - 5-10 reps - 3-5 sec  hold - Prone Shoulder Extension  - 1 x daily - 7 x weekly - 1-2 sets - 10 reps - 3 sec  hold - Prone Scapular Retraction and Row  - 1 x daily - 7 x weekly - 1-2 sets - 10 reps - 3 sec  hold - Sidelying Shoulder External Rotation  - 1 x daily - 7 x weekly - 1 sets - 10 reps - 3 sec  hold - Supine Shoulder Circles  - 1 x daily - 7 x weekly - 1-2 sets - 10 reps - Sidelying Shoulder Flexion 15 Degrees  - 1 x daily - 7 x weekly - 1-2 sets - 10 reps - 2-3 sec  hold - Shoulder Internal Rotation with Resistance  - 2 x daily - 7 x weekly - 1 sets - 3 reps - 30 sec  hold - Shoulder External Rotation with Anchored Resistance  - 2 x daily - 7 x weekly - 1-2 sets - 10 reps - 3 sec  hold - Standing Shoulder Row Reactive Isometric  - 2 x daily - 7 x weekly - 1 sets  - 10 reps - 30-45 sec  hold - Standing Shoulder Flexion to 90 Degrees  - 2 x daily - 7 x weekly - 1 sets - 10 reps - 2-3 sec  hold - Supine Chest Stretch with Elbows Bent  - 2 x daily - 7 x weekly - 1 sets - 3-5 reps - 20-30 sec  hold - Wall Push Up  - 1 x daily - 7 x weekly - 1-3 sets - 10 reps - 3 sec  hold - Bicep Stretch at Table  - 2 x daily - 7 x weekly - 1 sets - 3-5 reps - 10 sec  hold - Anti-Rotation Lateral Stepping with Press  - 2 x daily - 7 x weekly - 1-2 sets - 10  reps - 2-3 sec  hold - Shoulder External Rotation in Abduction with Anchored Resistance  - 1 x daily - 7 x weekly - 1-3 sets - 10 reps - 3 sec  hold - Shoulder PNF D2 with Resistance  - 1 x daily - 7 x weekly - 1-2 sets - 10 reps - 2 sec  hold - Plank on Counter  - 2 x daily - 7 x weekly - 1 sets - 3 reps - 30 sec  hold - Standing Shoulder Internal Rotation Stretch with Towel  - 2 x daily - 7 x weekly - 1 sets - 3-5 reps - 15-20 sec  hold - Wall Clock with Theraband  - 1 x daily - 7 x weekly - 1 sets - 10 reps - 2-3 sec  hold - Standing Shoulder External Rotation Stretch at Wall  - 2 x daily - 7 x weekly - 1 sets - 3 reps - 30 sec  hold

## 2021-11-28 NOTE — Therapy (Signed)
[place] [facility] [place], [place], [address] Phone: [phone]   Fax:  [phone]  Physical Therapy Treatment Rationale for Evaluation and Treatment Rehabilitation  Patient Details  Name: Marvens Hollars MRN: [id number] Date of Birth: 03/30/1956 Referring Provider (PT): Dr [name]   Encounter Date: 11/28/2021   PT End of Session - 11/28/21 1321     Visit Number 23    Number of Visits 32    Date for PT Re-Evaluation 12/06/21    Authorization - Visit Number 23    Progress Note Due on Visit 30    PT Start Time 5462    PT Stop Time 1403    PT Time Calculation (min) 45 min    Activity Tolerance Patient tolerated treatment well             Past Medical History:  Diagnosis Date   A-fib [facility])    Arthritis    hands,   Cancer ([place])    prostate   Dysrhythmia 08/2019   A-fib from energy medication    Past Surgical History:  Procedure Laterality Date   BACK SURGERY  2000   plate in V0--J5   TOTAL SHOULDER 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       [facility] PT Assessment - 11/28/21 0001       Assessment   Medical Diagnosis Lt TSA    Referring Provider (PT) Dr [name]    Onset Date/Surgical Date 08/14/21    Hand Dominance Right    Next MD Visit 11/15/21    Prior Therapy here for Lt shoulder pain      AROM   Left Shoulder Extension 50 Degrees    Left Shoulder Flexion 140 Degrees    Left Shoulder ABduction 147 Degrees   scaption   Left Shoulder Internal Rotation --   thumb to waist   Left Shoulder External Rotation 45 Degrees   elbow at side                          [facility] Adult PT Treatment/Exercise - 11/28/21 0001       Shoulder Exercises: Supine   External Rotation Strengthening;Left;20  reps;Theraband    Theraband Level (Shoulder External Rotation) Level 1 (Yellow)    External Rotation Limitations Lt elbow elevated on pillow to improve scapular plane      Shoulder Exercises: ROM/Strengthening   Wall Pushups 10 reps    Modified Plank 45 seconds;3 reps    Modified Plank Limitations counter plank    Other ROM/Strengthening Exercises wall clock yellow TB      Shoulder Exercises: Stretch   Internal Rotation Stretch 3 reps    Internal Rotation Stretch Limitations 15 sec hold with strap - gentle stretch    Wall Stretch - ABduction Limitations horizontal abduction stretch 15-20 sec x 3 reps    Other Shoulder Stretches biceps stretch 10 sec x 3 reps at table repeated x 2 reps at doorway      Manual Therapy   Soft tissue mobilization deep tissue work t anterior chest/pecs; deltoid/biceps                     PT Education - 11/28/21 1403     Education Details HEP  Person(s) Educated Patient    Methods Explanation;Demonstration;Tactile cues;Verbal cues;Handout    Comprehension Verbalized understanding;Returned demonstration;Verbal cues required;Tactile cues required              PT Short Term Goals - 09/17/21 1350       PT SHORT TERM GOAL #1   Title Improve posture and alignment with patient to demonstrate improve upright posture with posterior shoulder girdle engaged    Status Partially Met      PT SHORT TERM GOAL #2   Title Patient to demonstrate AROM Lt shoulder to 90-100 degrees; ER to 25 degrees in scapular plane    Status On-going      PT SHORT TERM GOAL #3   Title Patient to demonstrate 3+/5 to 4/5 strength Lt shoulder in available range    Status On-going               PT Long Term Goals - 08/16/21 1302       PT LONG TERM GOAL #1   Title Patient to demonstrate AROM Lt shoulder to 150-160 deg flexion; 75 degrees ER; 60 degrees IR    Time 16    Period Weeks    Status New    Target Date 12/06/21      PT LONG TERM GOAL #2    Title Increase strength Lt shoulder to 4/5 for ER; IR; abduction    Time 16    Period Weeks    Status New    Target Date 12/06/21      PT LONG TERM GOAL #3   Title Patient to report improved ability to reach overhead functionally    Time 16    Period Weeks    Status New    Target Date 12/06/21      PT LONG TERM GOAL #4   Title Independent in HEP    Time 16    Period Weeks    Status New    Target Date 12/06/21      PT LONG TERM GOAL #5   Title Improve functional limitation score to 57    Time 16    Period Weeks    Status New    Target Date 12/06/21                   Plan - 11/28/21 1323     Clinical Impression Statement Note good improvement in AROM and strength Lt UE. Continued pain "top" of Lt shoulder". Working on exercises but still has pain with forward flexion and is unable to lift with Lt UE. Continued pain with superior/anterior Lt shoulder with ER and ER with elevation. Added gentle stretch for IR/ER/horizontal abduction; strengthening(wall clock). Assess response to addition of stretches for rotation.    Rehab Potential Good    PT Frequency 2x / week    PT Duration 12 weeks    PT Treatment/Interventions ADLs/Self Care Home Management;Aquatic Therapy;Cryotherapy;Electrical Stimulation;Iontophoresis 3m/ml Dexamethasone;Moist Heat;Ultrasound;Functional mobility training;Therapeutic activities;Therapeutic exercise;Neuromuscular re-education;Patient/family education;Manual techniques;Passive range of motion;Dry needling;Taping;Vasopneumatic Device    PT Next Visit Plan progress with ROM and strengthening as patient tolerates and per protocol    PT Home Exercise Plan [id number]   Consulted and Agree with Plan of Care Patient             Patient will benefit from skilled therapeutic intervention in order to improve the following deficits and impairments:     Visit Diagnosis: Muscle weakness (generalized)  Chronic left shoulder pain  Other symptoms and  signs  involving the musculoskeletal system  Abnormal posture     Problem List Patient Active Problem List   Diagnosis Date Noted   Greater trochanteric pain syndrome of left lower extremity 10/20/2019    Jonalyn Sedlak Nilda Simmer, PT, MPH  11/28/2021, 2:16 PM  [facility] [place], [place], [address] Phone: [phone]   Fax:  [phone]  Name: Shamus Desantis MRN: [id number] Date of Birth: 1955-11-09

## 2021-12-05 ENCOUNTER — Ambulatory Visit: Payer: Medicare Other | Admitting: Physical Therapy

## 2021-12-12 ENCOUNTER — Encounter: Payer: Self-pay | Admitting: Rehabilitative and Restorative Service Providers"

## 2021-12-12 ENCOUNTER — Ambulatory Visit: Payer: Medicare Other | Attending: Orthopedic Surgery | Admitting: Rehabilitative and Restorative Service Providers"

## 2021-12-12 DIAGNOSIS — M25512 Pain in left shoulder: Secondary | ICD-10-CM | POA: Diagnosis present

## 2021-12-12 DIAGNOSIS — R293 Abnormal posture: Secondary | ICD-10-CM | POA: Insufficient documentation

## 2021-12-12 DIAGNOSIS — G8929 Other chronic pain: Secondary | ICD-10-CM | POA: Insufficient documentation

## 2021-12-12 DIAGNOSIS — M6281 Muscle weakness (generalized): Secondary | ICD-10-CM | POA: Diagnosis present

## 2021-12-12 DIAGNOSIS — R29898 Other symptoms and signs involving the musculoskeletal system: Secondary | ICD-10-CM | POA: Insufficient documentation

## 2021-12-12 NOTE — Therapy (Signed)
[facility] [place], [place], [address] Phone: [phone]   Fax:  [phone]  Physical Therapy Treatment Rationale for Evaluation and Treatment Rehabilitation  PHYSICAL THERAPY DISCHARGE SUMMARY  Visits from Start of Care: 24  Current functional level related to goals / functional outcomes: See progress note    Remaining deficits: Pain and decreased strength with end range Lt shoulder ER    Education / Equipment: HEP   Patient agrees to discharge. Patient goals were partially met. Patient is being discharged due to being pleased with the current functional level.   Devian Bartolomei P. Helene Kelp PT, MPH 12/12/21 1:09 PM  Patient Details  Name: Raymond Walsh MRN: [id number] Date of Birth: 07/06/1955 Referring Provider (PT): Dr [name]   Encounter Date: 12/12/2021   PT End of Session - 12/12/21 1149     Visit Number 24    Number of Visits 32    Date for PT Re-Evaluation 12/06/21    Authorization - Visit Number 24    Progress Note Due on Visit 69    PT Start Time 3664    PT Stop Time 1232    PT Time Calculation (min) 47 min    Activity Tolerance Patient tolerated treatment well             Past Medical History:  Diagnosis Date   A-fib [facility])    Arthritis    hands,   Cancer ([place])    prostate   Dysrhythmia 08/2019   A-fib from energy medication    Past Surgical History:  Procedure Laterality Date   BACK SURGERY  2000   plate in Q0--H4   TOTAL SHOULDER 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   Subjective Assessment - 12/12/21 1150     Subjective Patient reports that he has a DVT in the Lt posterior knee. He is being treated by vein specialist for DVT. Patient reports that he has continued pain in the Lt shoulder with ER  in elevated position. Can tell he is making progress but wants to have more strength in ER and less pain with moving into that position. Confident in continuing with independent HEP and will call with any questions or problems    Currently in Pain? No/denies    Pain Score 0-No pain   no pain at rest   Pain Onset More than a month ago    Pain Frequency Intermittent    Aggravating Factors  ER Lt shoulder    Pain Relieving Factors ice; rest                [facility] PT Assessment - 12/12/21 0001       Assessment   Medical Diagnosis Lt TSA    Referring Provider (PT) Dr [name]    Onset Date/Surgical Date 08/14/21    Hand Dominance Right    Next MD Visit 11/15/21    Prior Therapy here for Lt shoulder pain      Observation/Other Assessments   Focus on Therapeutic Outcomes (FOTO)  80      AROM   Left Shoulder Extension 50 Degrees    Left Shoulder Flexion 143 Degrees    Left Shoulder ABduction 147 Degrees   scaption   Left Shoulder Internal Rotation --   thumb to T10   Left Shoulder  External Rotation 45 Degrees   elbow at side     PROM   Left Shoulder Flexion 145 Degrees    Left Shoulder ABduction 148 Degrees    Left Shoulder Internal Rotation 70 Degrees    Left Shoulder External Rotation 48 Degrees      Strength   Left Shoulder Flexion 5/5    Left Shoulder Extension 5/5    Left Shoulder ABduction 4+/5    Left Shoulder Internal Rotation 5/5    Left Shoulder External Rotation 4/5      Palpation   Palpation comment tenderness and tightness to palpation anterior Lt shoulder at biceps tendon area; some persistent muscular tightness Lt upper quarter - through the pecs; anterior deltiod; biceps  tightness noted in biceps tendon area and into biceps muscle                           [facility] Adult PT Treatment/Exercise - 12/12/21 0001       Shoulder Exercises: Supine   External Rotation Strengthening;Left;20 reps;Theraband    Theraband Level (Shoulder External  Rotation) Level 1 (Yellow)    External Rotation Weight (lbs) 1#; 2#    External Rotation Limitations Lt elbow elevated on pillow to improve scapular plane    Other Supine Exercises triceps extension AROM; 2, 3, 7# wt x 30 reps    Other Supine Exercises PNF D1 yellow TB x 15 each side      Shoulder Exercises: Seated   External Rotation Strengthening;Left;10 reps;Theraband    Theraband Level (Shoulder External Rotation) Level 1 (Yellow)    External Rotation Limitations elbow propped ~ 30-35 deg abduction from side    Abduction Strengthening;Left;20 reps    ABduction Limitations elbow flexed - working on deltoid    Other Seated Exercises overhead press x 20; overhead press with triceps extension x 30 with 7# - sustained overhead hold shoulders 90/elbows 90 deg 2-3 min      Shoulder Exercises: Standing   External Rotation Strengthening;Left;20 reps    Theraband Level (Shoulder External Rotation) Level 2 (Red)    Internal Rotation Strengthening;Left;10 reps;Theraband    Theraband Level (Shoulder Internal Rotation) Level 2 (Red)    Row Strengthening;Both;20 reps;Theraband    Theraband Level (Shoulder Row) Level 4 (Blue)    Other Standing Exercises bow and arrow green B x 10 each side    Other Standing Exercises antirotatin green TB push pull; shd flex/ext; shd circles CW/CCW arms outstretched; figure 8 arms outstretched x 10 each      Shoulder Exercises: Pulleys   Flexion 1 minute    Flexion Limitations 10 second holds x 10 reps      Shoulder Exercises: ROM/Strengthening   Wall Pushups 10 reps    Modified Plank 45 seconds;3 reps    Modified Plank Limitations counter plank    Other ROM/Strengthening Exercises wall clock yellow TB      Shoulder Exercises: Stretch   Internal Rotation Stretch 3 reps    Internal Rotation Stretch Limitations 15 sec hold with strap - gentle stretch    Wall Stretch - ABduction Limitations horizontal abduction stretch 15-20 sec x 3 reps    Other Shoulder  Stretches doorway stretch 3 positions 20 sec hold x 2 reps each position    Other Shoulder Stretches biceps stretch 10 sec x 3 reps at table repeated x 2 reps at doorway      Shoulder Exercises: Body Blade   Flexion 30 seconds;2 reps  Flexion Limitations bilat    Other Body Blade Exercises waist to overhead bilat UE's x 5 reps                       PT Short Term Goals - 09/17/21 1350       PT SHORT TERM GOAL #1   Title Improve posture and alignment with patient to demonstrate improve upright posture with posterior shoulder girdle engaged    Status Partially Met      PT SHORT TERM GOAL #2   Title Patient to demonstrate AROM Lt shoulder to 90-100 degrees; ER to 25 degrees in scapular plane    Status On-going      PT SHORT TERM GOAL #3   Title Patient to demonstrate 3+/5 to 4/5 strength Lt shoulder in available range    Status On-going               PT Long Term Goals - 12/12/21 1216       PT LONG TERM GOAL #1   Title Patient to demonstrate AROM Lt shoulder to 150-160 deg flexion; 75 degrees ER; 60 degrees IR    Period Weeks    Status Partially Met      PT LONG TERM GOAL #2   Title Increase strength Lt shoulder to 4/5 for ER; IR; abduction    Time 16    Period Weeks    Status Partially Met    Target Date 12/06/21      PT LONG TERM GOAL #3   Title Patient to report improved ability to reach overhead functionally    Time 16    Period Weeks    Status Achieved    Target Date 12/06/21      PT LONG TERM GOAL #4   Title Independent in HEP    Time 16    Period Weeks    Status Achieved    Target Date 12/06/21      PT LONG TERM GOAL #5   Title Improve functional limitation score to 57    Time 16    Period Weeks    Status Achieved    Target Date 12/06/21                   Plan - 12/12/21 1151     Clinical Impression Statement Patient reports continued pain and weakness with ER. He demonstrates good ROM and significant improvement in ER  ROM and strength. He continues to have pain with end range ER in shoulder 90/elbow 90 position and decreased strength in ER although he has made excellent progress with both. Patient has partially accomplished goals and is independent in HEP at this point. He will continue with ROM, strengthing, strengthening, progression of functional activities and call with any questions or problems.    Rehab Potential Good    PT Frequency 2x / week    PT Duration 12 weeks    PT Treatment/Interventions ADLs/Self Care Home Management;Aquatic Therapy;Cryotherapy;Electrical Stimulation;Iontophoresis 58m/ml Dexamethasone;Moist Heat;Ultrasound;Functional mobility training;Therapeutic activities;Therapeutic exercise;Neuromuscular re-education;Patient/family education;Manual techniques;Passive range of motion;Dry needling;Taping;Vasopneumatic Device    PT Next Visit Plan d/c to [id number]   Consulted and Agree with Plan of Care Patient             Patient will benefit from skilled therapeutic intervention in order to improve the following deficits and impairments:     Visit Diagnosis: Muscle weakness (generalized)  Chronic left shoulder  pain  Other symptoms and signs involving the musculoskeletal system  Abnormal posture     Problem List Patient Active Problem List   Diagnosis Date Noted   Greater trochanteric pain syndrome of left lower extremity 10/20/2019    Erikson Danzy Nilda Simmer, PT, MPH  12/12/2021, 1:08 PM  [facility] [place], [place], [address] Phone: [phone]   Fax:  [phone]  Name: Raymond Walsh MRN: [id number] Date of Birth: December 21, 1955

## 2021-12-19 ENCOUNTER — Encounter: Payer: Medicare Other | Admitting: Rehabilitative and Restorative Service Providers"

## 2021-12-26 ENCOUNTER — Encounter: Payer: Medicare Other | Admitting: Rehabilitative and Restorative Service Providers"

## 2022-02-28 ENCOUNTER — Other Ambulatory Visit: Payer: Self-pay | Admitting: Orthopedic Surgery

## 2022-02-28 DIAGNOSIS — M25512 Pain in left shoulder: Secondary | ICD-10-CM

## 2022-03-01 ENCOUNTER — Other Ambulatory Visit: Payer: Self-pay | Admitting: Orthopedic Surgery

## 2022-03-01 DIAGNOSIS — M545 Low back pain, unspecified: Secondary | ICD-10-CM

## 2022-03-13 ENCOUNTER — Ambulatory Visit
Admission: RE | Admit: 2022-03-13 | Discharge: 2022-03-13 | Disposition: A | Discharge status: Home / Self Care | Payer: Medicare Other | Source: Ambulatory Visit | Attending: Orthopedic Surgery | Admitting: Orthopedic Surgery

## 2022-03-13 DIAGNOSIS — M25512 Pain in left shoulder: Secondary | ICD-10-CM

## 2022-03-13 DIAGNOSIS — M545 Low back pain, unspecified: Secondary | ICD-10-CM

## 2022-03-26 ENCOUNTER — Ambulatory Visit
Admission: RE | Admit: 2022-03-26 | Discharge: 2022-03-26 | Disposition: A | Discharge status: Home / Self Care | Payer: Medicare Other | Source: Ambulatory Visit | Attending: Orthopedic Surgery | Admitting: Orthopedic Surgery

## 2022-03-26 DIAGNOSIS — M25512 Pain in left shoulder: Secondary | ICD-10-CM

## 2022-03-28 ENCOUNTER — Other Ambulatory Visit: Payer: Self-pay | Admitting: Orthopedic Surgery

## 2022-03-28 DIAGNOSIS — M25512 Pain in left shoulder: Secondary | ICD-10-CM

## 2022-04-18 ENCOUNTER — Ambulatory Visit
Admission: RE | Admit: 2022-04-18 | Discharge: 2022-04-18 | Disposition: A | Discharge status: Home / Self Care | Payer: Medicare Other | Source: Ambulatory Visit | Attending: Orthopedic Surgery | Admitting: Orthopedic Surgery

## 2022-04-18 DIAGNOSIS — M25512 Pain in left shoulder: Secondary | ICD-10-CM

## 2022-04-18 MED ORDER — IOPAMIDOL (ISOVUE-M 200) INJECTION 41%
13.0000 mL | Freq: Once | INTRAMUSCULAR | Status: AC
  Administered 2022-04-18: 13 mL via INTRA_ARTICULAR

## 2022-08-01 IMAGING — DX DG SHOULDER 1V*L*
2 series · 2 of 2 positions shown · non-contrast
Comparison: Portable exam 4554 hours compared to MR LEFT shoulder
08/06/2021

CLINICAL DATA: Post LEFT shoulder arthroplasty

EXAM:
LEFT SHOULDER

[shoulder ap]
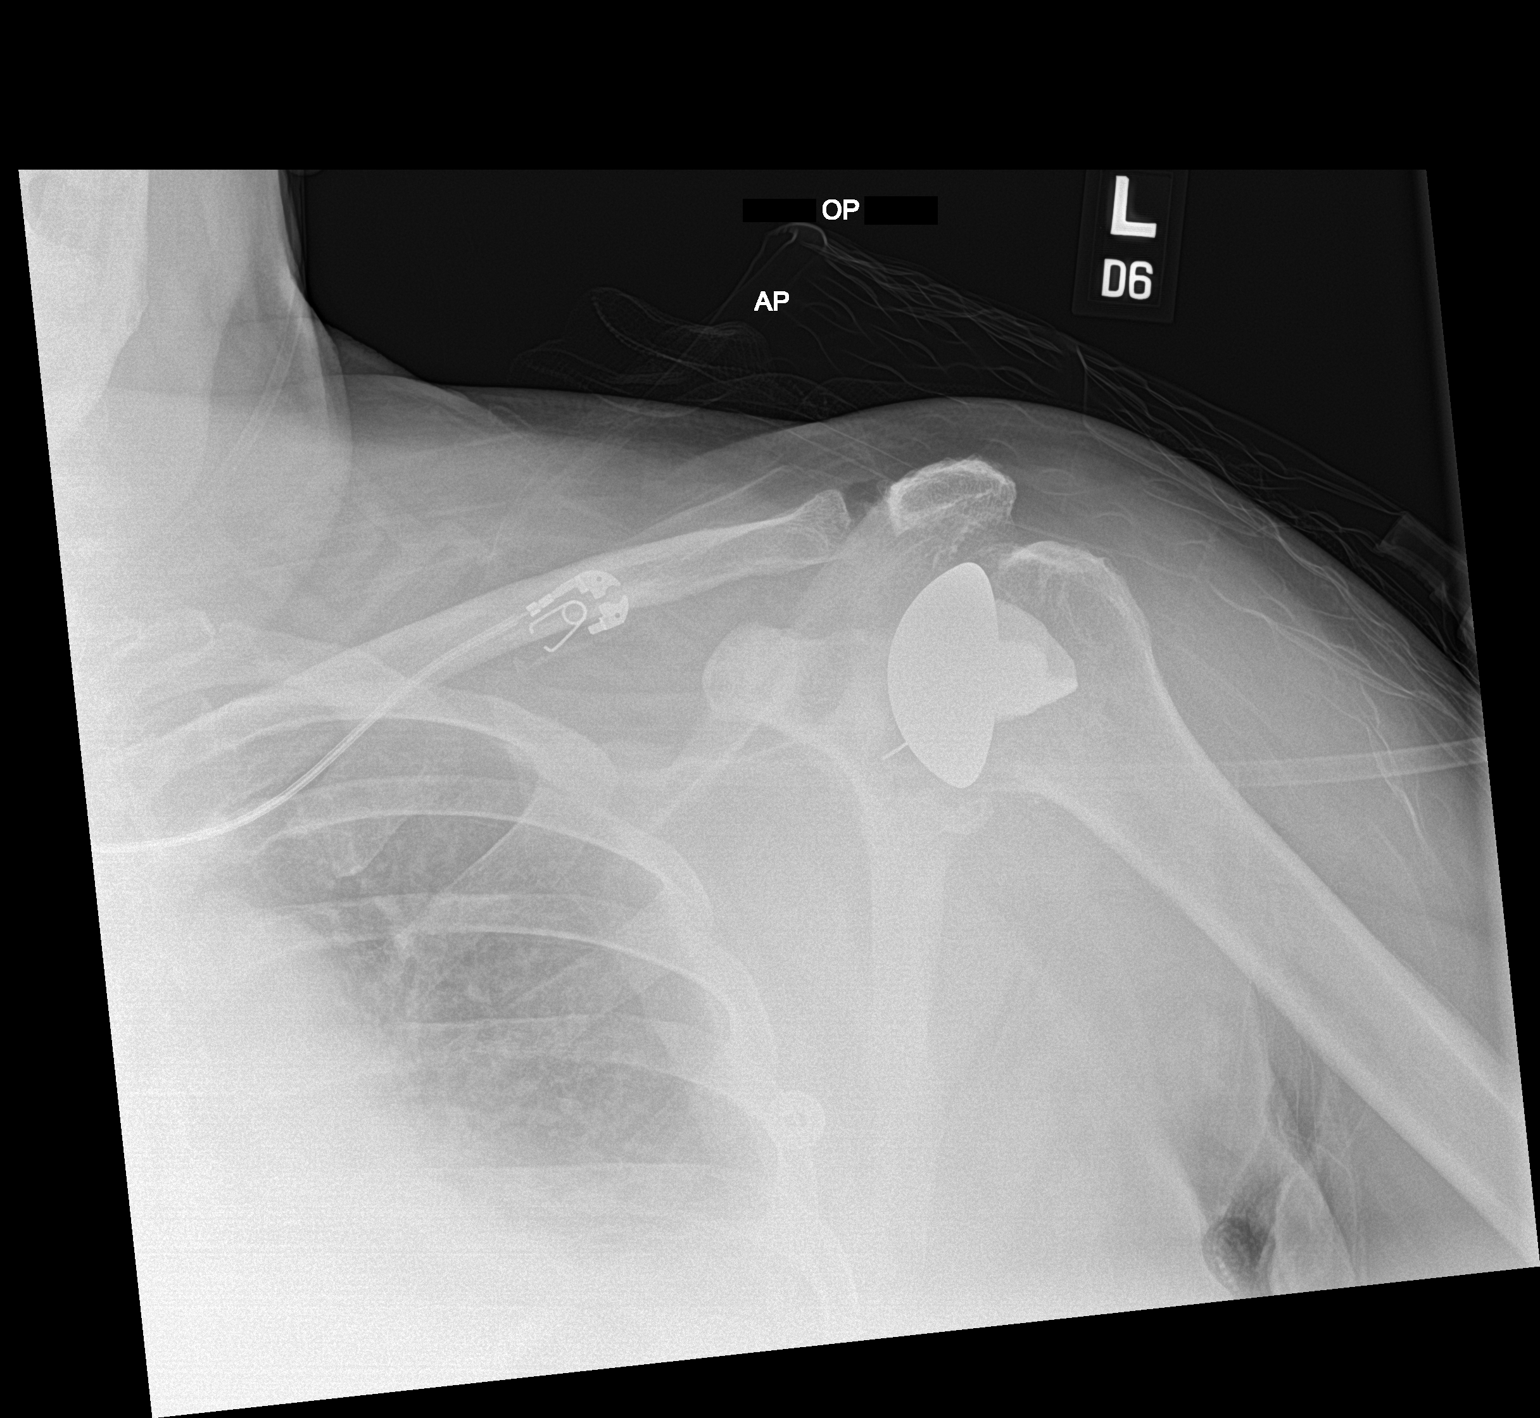

[shoulder obl]
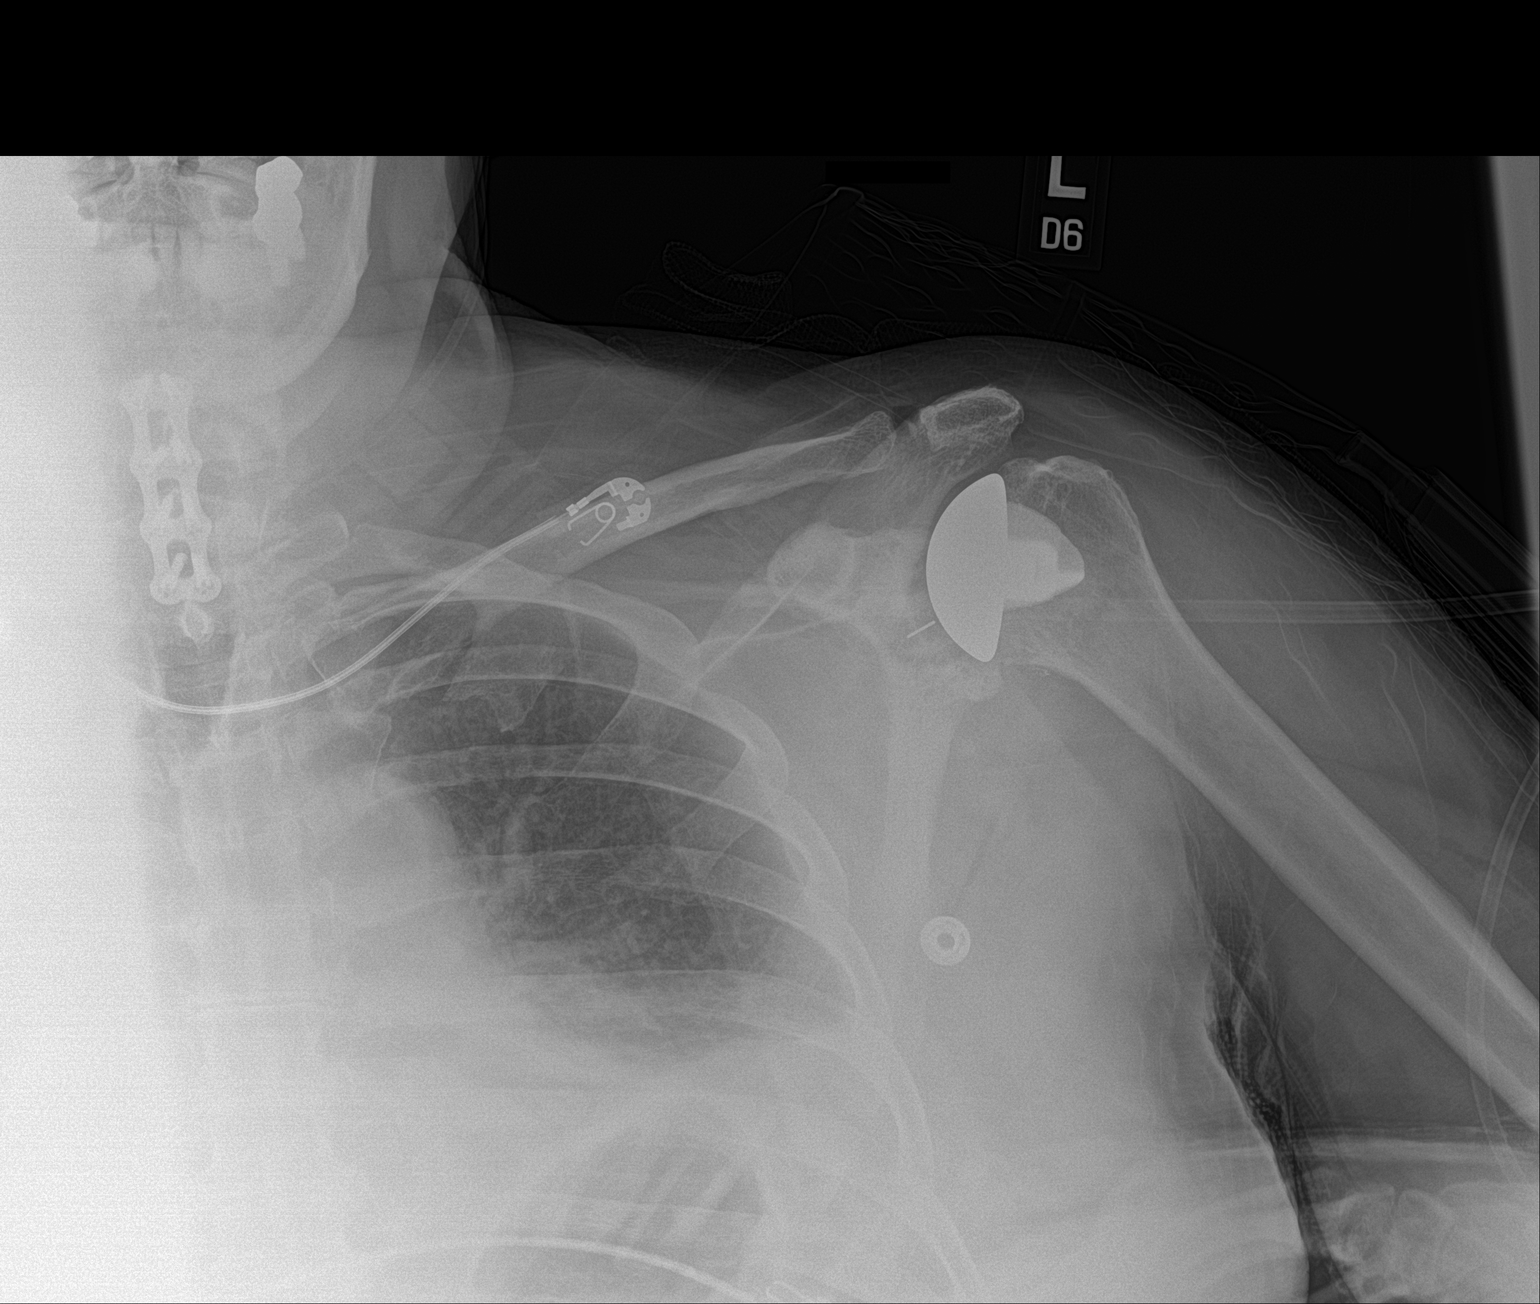

[2 of 2 positions shown; findings below may reference images not displayed]

FINDINGS: LEFT shoulder prosthesis identified.

Osseous mineralization normal for technique.

AC joint alignment normal.

No acute fracture, dislocation, or bone destruction.

Prior cervical spine fusion.
IMPRESSION: Post LEFT shoulder arthroplasty.

No acute abnormalities.

## 2022-08-19 NOTE — Patient Instructions (Signed)
SURGICAL WAITING ROOM VISITATION Patients having surgery or a procedure may have no more than 2 support people in the waiting area - these visitors may rotate in the visitor waiting room.   Due to an increase in RSV and influenza rates and associated hospitalizations, children ages 65 and under may not visit patients in [facility]. If the patient needs to stay at the hospital during part of their recovery, the visitor guidelines for inpatient rooms apply.  PRE-OP VISITATION  Pre-op nurse will coordinate an appropriate time for 1 support person to accompany the patient in pre-op.  This support person may not rotate.  This visitor will be contacted when the time is appropriate for the visitor to come back in the pre-op area.  Please refer to the [facility] website for the visitor guidelines for Inpatients (after your surgery is over and you are in a regular room).  You are not required to quarantine at this time prior to your surgery. However, you must do this: Hand Hygiene often Do NOT share personal items Notify your provider if you are in close contact with someone who has COVID or you develop fever 100.4 or greater, new onset of sneezing, cough, sore throat, shortness of breath or body aches.  If you test positive for Covid or have been in contact with anyone that has tested positive in the last 10 days please notify you surgeon.    Your procedure is scheduled on:  Wednesday  August 28, 2022  Report to [facility] Main Entrance: [address] entrance where the [employer] is available.   Report to admitting at:  11:30 AM  +++++Call this number if you have any questions or problems the morning of surgery [phone]  Do not eat food after Midnight the night prior to your surgery/procedure.  After Midnight you may have the following liquids until  ???       AM / PM DAY OF SURGERY  Clear Liquid Diet Water Black Coffee (sugar ok, NO MILK/CREAM OR CREAMERS)  Tea  (sugar ok, NO MILK/CREAM OR CREAMERS) regular and decaf                             Plain Jell-O  with no fruit (NO RED)                                           Fruit ices (not with fruit pulp, NO RED)                                     Popsicles (NO RED)                                                                  Juice: apple, WHITE grape, WHITE cranberry Sports drinks like Gatorade or Powerade (NO RED)                   The day of surgery:  Drink ONE (1) Pre-Surgery Clear Ensure or G2 at  AM the morning of surgery. Drink in one sitting. Do not sip.  This drink was given to you during your hospital pre-op appointment visit. Nothing else to drink after completing the Pre-Surgery Clear Ensure or G2 : No candy, chewing gum or throat lozenges.    FOLLOW ANY ADDITIONAL PRE OP INSTRUCTIONS YOU RECEIVED FROM YOUR SURGEON'S OFFICE!!!   Oral Hygiene is also important to reduce your risk of infection.        Remember - BRUSH YOUR TEETH THE MORNING OF SURGERY WITH YOUR REGULAR TOOTHPASTE  Do NOT smoke after Midnight the night before surgery.  Take ONLY these medicines the morning of surgery with A SIP OF WATER: ????   If You have been diagnosed with Sleep Apnea - Bring CPAP mask and tubing day of surgery. We will provide you with a CPAP machine on the day of your surgery.                   You may not have any metal on your body including  jewelry, and body piercing  Do not wear lotions, powders, cologne, or deodorant  Men may shave face and neck.  Contacts, Hearing Aids, dentures or bridgework may not be worn into surgery. DENTURES WILL BE REMOVED PRIOR TO SURGERY PLEASE DO NOT APPLY "Poly grip" OR ADHESIVES!!!  You may bring a small overnight bag with you on the day of surgery, only pack items that are not valuable. [facility] IS NOT RESPONSIBLE   FOR VALUABLES THAT ARE LOST OR STOLEN.    Do not bring your home medications to the hospital. The Pharmacy will dispense  medications listed on your medication list to you during your admission in the Hospital.  Special Instructions: Bring a copy of your healthcare power of attorney and living will documents the day of surgery, if you wish to have them scanned into your [facility] Medical Records- EPIC  Please read over the following fact sheets you were given: IF YOU HAVE QUESTIONS ABOUT YOUR PRE-OP INSTRUCTIONS, PLEASE CALL [phone]  ([place])   [facility] - Preparing for Surgery Before surgery, you can play an important role.  Because skin is not sterile, your skin needs to be as free of germs as possible.  You can reduce the number of germs on your skin by washing with CHG (chlorahexidine gluconate) soap before surgery.  CHG is an antiseptic cleaner which kills germs and bonds with the skin to continue killing germs even after washing. Please DO NOT use if you have an allergy to CHG or antibacterial soaps.  If your skin becomes reddened/irritated stop using the CHG and inform your nurse when you arrive at Short Stay. Do not shave (including legs and underarms) for at least 48 hours prior to the first CHG shower.  You may shave your face/neck.  Please follow these instructions carefully:  1.  Shower with CHG Soap the night before surgery and the  morning of surgery.  2.  If you choose to wash your hair, wash your hair first as usual with your normal  shampoo.  3.  After you shampoo, rinse your hair and body thoroughly to remove the shampoo.                             4.  Use CHG as you would any other liquid soap.  You can apply chg directly to the skin and wash.  Gently with a scrungie or  clean washcloth.  5.  Apply the CHG Soap to your body ONLY FROM THE NECK DOWN.   Do not use on face/ open                           Wound or open sores. Avoid contact with eyes, ears mouth and genitals (private parts).                       Wash face,  Genitals (private parts) with your normal soap.             6.  Wash  thoroughly, paying special attention to the area where your  surgery  will be performed.  7.  Thoroughly rinse your body with warm water from the neck down.  8.  DO NOT shower/wash with your normal soap after using and rinsing off the CHG Soap.            9.  Pat yourself dry with a clean towel.            10.  Wear clean pajamas.            11.  Place clean sheets on your bed the night of your first shower and do not  sleep with pets.  ON THE DAY OF SURGERY : Do not apply any lotions/deodorants the morning of surgery.  Please wear clean clothes to the hospital/surgery center.    FAILURE TO FOLLOW THESE INSTRUCTIONS MAY RESULT IN THE CANCELLATION OF YOUR SURGERY  PATIENT SIGNATURE_________________________________  NURSE SIGNATURE__________________________________  ________________________________________________________________________

## 2022-08-19 NOTE — Progress Notes (Signed)
COVID Vaccine received:  []$  No [x]$  Yes Date of any COVID positive Test in last 90 days:  PCP - Harvel Ricks, MD  [facility]  [phone] Cardiologist -  Loni Beckwith, MD at [facility] [phone]   Fax) [phone]  Chest x-ray - 06-15-2020  2v  epic EKG -  08-03-2021  epic  will repeat Stress Test -  ECHO -  Cardiac Cath -   PCR screen: [x]$  Ordered & Completed                      []$   No Order but Needs PROFEND                      []$   N/A for this surgery  Surgery Plan:  []$  Ambulatory                            []$  Outpatient in bed                            [x]$  Admit  Anesthesia:    [x]$  General  []$  Spinal                           []$   Choice []$   MAC  Pacemaker / ICD device [x]$  No []$  Yes        Device order form faxed [x]$  No    []$   Yes      Faxed to:  Spinal Cord Stimulator:[x]$  No []$  Yes      (Remind patient to bring remote DOS) Other Implants:   History of Sleep Apnea? [x]$  No []$  Yes   CPAP used?- [x]$  No []$  Yes    Does the patient monitor blood sugar? []$  No []$  Yes  [x]$  N/A  Blood Thinner / Instructions:Stopped Eliquis (completed treatment) per Dr. Elonda Husky 07-19-2022 note Aspirin Instructions:  None  ERAS Protocol Ordered: []$  No  []$  Yes PRE-SURGERY []$  ENSURE  []$  G2  []$  No Drink Ordered  Patient is to be NPO after: 3 hours  prior to surgery d/t no orders   Comments: NO DOCTORS ORDERS as of 08-21-22 at 0900, Patient did not receive his Benzoyl Peroxide gel d/t no orders at the time of PST. An IB message was sent to Dr. Griffin Basil and Noemi Chapel.   Activity level: Patient can not climb a flight of stairs without difficulty; [x]$  No CP  [x]$  No SOB. Patient can perform ADLs without assistance.   Anesthesia review: A.fib- ablation, Dx DVTs, Prostate cancer.   Patient denies shortness of breath, fever, cough and chest pain at PAT appointment.  Patient verbalized understanding and agreement to the Pre-Surgical Instructions that were given to them at this  PAT appointment. Patient was also educated of the need to review these PAT instructions again prior to his/her surgery.I reviewed the appropriate phone numbers to call if they have any and questions or concerns.

## 2022-08-21 ENCOUNTER — Encounter (HOSPITAL_COMMUNITY): Payer: Self-pay

## 2022-08-21 ENCOUNTER — Encounter (HOSPITAL_COMMUNITY)
Admission: RE | Admit: 2022-08-21 | Discharge: 2022-08-21 | Disposition: A | Discharge status: Home / Self Care | Payer: Medicare Other | Source: Ambulatory Visit | Attending: Orthopaedic Surgery | Admitting: Orthopaedic Surgery

## 2022-08-21 ENCOUNTER — Other Ambulatory Visit: Payer: Self-pay

## 2022-08-21 VITALS — BP 110/69 | HR 59 | Temp 98.4°F | Resp 16 | Ht 70.0 in | Wt 209.0 lb

## 2022-08-21 DIAGNOSIS — Z01818 Encounter for other preprocedural examination: Secondary | ICD-10-CM | POA: Insufficient documentation

## 2022-08-21 DIAGNOSIS — Z7901 Long term (current) use of anticoagulants: Secondary | ICD-10-CM | POA: Insufficient documentation

## 2022-08-21 HISTORY — DX: Personal history of other venous thrombosis and embolism: Z86.718

## 2022-08-21 LAB — SURGICAL PCR SCREEN
MRSA, PCR: NEGATIVE
Staphylococcus aureus: NEGATIVE

## 2022-08-21 LAB — CBC
HCT: 44.2 % (ref 39.0–52.0)
Hemoglobin: 14.4 g/dL (ref 13.0–17.0)
MCH: 30.4 pg (ref 26.0–34.0)
MCHC: 32.6 g/dL (ref 30.0–36.0)
MCV: 93.2 fL (ref 80.0–100.0)
Platelets: 282 10*3/uL (ref 150–400)
RBC: 4.74 MIL/uL (ref 4.22–5.81)
RDW: 11.9 % (ref 11.5–15.5)
WBC: 5.3 10*3/uL (ref 4.0–10.5)
nRBC: 0 % (ref 0.0–0.2)

## 2022-08-21 LAB — BASIC METABOLIC PANEL
Anion gap: 6 (ref 5–15)
BUN: 19 mg/dL (ref 8–23)
CO2: 26 mmol/L (ref 22–32)
Calcium: 9 mg/dL (ref 8.9–10.3)
Chloride: 106 mmol/L (ref 98–111)
Creatinine, Ser: 1.16 mg/dL (ref 0.61–1.24)
GFR, Estimated: >60 mL/min (ref >60)
Glucose, Bld: 101 mg/dL — ABNORMAL HIGH (ref 70–99)
Potassium: 4.3 mmol/L (ref 3.5–5.1)
Sodium: 138 mmol/L (ref 135–145)

## 2022-08-21 NOTE — Progress Notes (Signed)
Sent IB message to Dr. Griffin Basil and Noemi Chapel that surgical orders are needed for this patient for his PST interview today.

## 2022-08-22 NOTE — Progress Notes (Signed)
Anesthesia Chart Review   Case: [id number] Date/Time: 08/28/22 1351   Procedure: TOTAL SHOULDER REVISION (Left: Shoulder)   Anesthesia type: General   Pre-op diagnosis: failed hardware   [facility] ROOM 09 / [facility]   Surgeons: Hiram Gash, MD       DISCUSSION:67 y.o. never smoker with h/o a-fib s/p ablation, DVT, failed total shoulder hardware scheduled for above procedure 08/28/22 with Dr. Ophelia Charter.   Pt last seen by cardiology 07/19/2022. Per note, "[place] Ray Friedlander returns in follow-up today having had an ablation about 2 years ago. He remains in sinus rhythm. He was found to have a DVT in his popliteal vein and was placed back on Eliquis. He has been on it for 3 to 6 months and has no symptoms. He was given permission to come off the medication and we will get a repeat Doppler in 3 to 4 months to be sure that there is no recurrence."  Pt no longer on Eliquis.  VS: BP 110/69 Comment: right arm sitting  Pulse (!) 59   Temp 36.9 C   Resp 16   Ht 5' 10"$  (1.778 m)   Wt 94.8 kg   SpO2 98%   BMI 29.99 kg/m   PROVIDERS: Eulah Pont, MD  Loni Beckwith, MD is Cardiologist  LABS: Labs reviewed: Acceptable for surgery. (all labs ordered are listed, but only abnormal results are displayed)  Labs Reviewed  BASIC METABOLIC PANEL - Abnormal; Notable for the following components:      Result Value   Glucose, Bld 101 (*)    All other components within normal limits  SURGICAL PCR SCREEN  CBC  TYPE AND SCREEN     IMAGES:   EKG:   CV: Echo 10/25/2020 Intracardiac ultrasound was used to direct transseptal puncture  successfully  2. Systolic function is normal  3.  Left atrial size is mildly enlarged  4.  The pericardium demonstrates no significant effusion post procedure   Stress Test 07/20/2020 IMPRESSION:  1.  Myocardial perfusion imaging scan demonstrates nonreversible lateral wall scintigraphic abnormality at appears to be due to gut attenuation artifact.   2.  Gated wall motion study demonstrates normal left ventricular ejection fraction 75% with normal wall motion.   Echo 01/18/20 Left Ventricle: Normal left ventricle.    Left Ventricle: Wall thickness is normal.    Left Ventricle: Systolic function is normal. EF: 55-60%.    Left Ventricle: Wall motion is normal.    Mitral Valve: There is mild regurgitation.    Aortic Valve: The aortic valve is tricuspid. The leaflets exhibit  normal excursion.    Aortic Valve: There is no regurgitation.    Tricuspid Valve: The right ventricular systolic pressure is normal (<36  mmHg).   Tricuspid Valve: Tricuspid valve structure is normal. The leaflets are  mildly thickened.  Past Medical History:  Diagnosis Date   A-fib [facility])    Arthritis    hands,   Cancer ([place])    prostate   Dysrhythmia 08/2019   A-fib from energy medication   History of deep vein thrombosis (DVT) of lower extremity     Past Surgical History:  Procedure Laterality Date   ATRIAL FIBRILLATION ABLATION  2023   Dr. Leander Rams SURGERY  2000   plate in QA348G   KNEE ARTHROPLASTY Right 2023   TOTAL SHOULDER ARTHROPLASTY Left 08/14/2021   Procedure: LEFT TOTAL SHOULDER ARTHROPLASTY;  Surgeon: [name], MD;  Location: [facility];  Service: Orthopedics;  Laterality: Left;    MEDICATIONS:  fluticasone (FLONASE) 50 MCG/ACT nasal spray   tamsulosin (FLOMAX) 0.4 MG CAPS capsule   No current facility-administered medications for this encounter.     Konrad Felix Ward, PA-C [facility] Pre-Surgical Testing [phone]

## 2022-08-27 NOTE — Progress Notes (Signed)
Pt aware to arrive at [facility] admitting at [place] on Wednesday 08/28/2022 for scheduled surgical procedure. No food after midnight; clear liquids from midnight till 0700 then nothing by mouth.

## 2022-08-27 NOTE — Anesthesia Preprocedure Evaluation (Signed)
Anesthesia Evaluation  Patient identified by MRN, date of birth, ID band Patient awake    Reviewed: Allergy & Precautions, NPO status , Patient's Chart, lab work & pertinent test results  History of Anesthesia Complications Negative for: history of anesthetic complications  Airway Mallampati: III  TM Distance: >3 FB Neck ROM: Full   Comment: Previous grade II view with MAC 4, easy mask Dental  (+) Dental Advisory Given   Pulmonary neg pulmonary ROS   Pulmonary exam normal breath sounds clear to auscultation       Cardiovascular (-) hypertension(-) angina + DVT  (-) Past MI, (-) Cardiac Stents and (-) CABG + dysrhythmias (s/p ablation, RBBB, LAFB) Atrial Fibrillation  Rhythm:Regular Rate:Normal     Neuro/Psych negative neurological ROS     GI/Hepatic negative GI ROS,,,(+)     substance abuse  marijuana use  Endo/Other  negative endocrine ROS    Renal/GU negative Renal ROS     Musculoskeletal  (+) Arthritis ,    Abdominal   Peds  Hematology negative hematology ROS (+)   Anesthesia Other Findings H/o prostate cancer  Reproductive/Obstetrics                             Anesthesia Physical Anesthesia Plan  ASA: 3  Anesthesia Plan: General   Post-op Pain Management: Regional block* and Tylenol PO (pre-op)*   Induction: Intravenous  PONV Risk Score and Plan: 2 and Ondansetron, Dexamethasone and Treatment may vary due to age or medical condition  Airway Management Planned: Oral ETT  Additional Equipment:   Intra-op Plan:   Post-operative Plan: Extubation in OR  Informed Consent: I have reviewed the patients History and Physical, chart, labs and discussed the procedure including the risks, benefits and alternatives for the proposed anesthesia with the patient or authorized representative who has indicated his/her understanding and acceptance.     Dental advisory given  Plan  Discussed with: CRNA and Anesthesiologist  Anesthesia Plan Comments: (Discussed potential risks of nerve blocks including, but not limited to, infection, bleeding, nerve damage, seizures, pneumothorax, respiratory depression, and potential failure of the block. Alternatives to nerve blocks discussed. All questions answered.  Risks of general anesthesia discussed including, but not limited to, sore throat, hoarse voice, chipped/damaged teeth, injury to vocal cords, nausea and vomiting, allergic reactions, lung infection, heart attack, stroke, and death. All questions answered. )        Anesthesia Quick Evaluation

## 2022-08-27 NOTE — H&P (Signed)
PREOPERATIVE H&P  Chief Complaint: failed hardware  HPI: Raymond Walsh is a 67 y.o. male who presents for preoperative history and physical prior to scheduled surgery, Procedure(s): TOTAL SHOULDER REVISION.   Patient has a past medical history significant for atrial fibrillation, prostate cancer, DVT.   Patient is a 67 year-old who had a total shoulder arthroplasty, anatomic, with another provider in February of 2023.  He had continued pain afterwards.  He is frustrated by his shoulder.  He used to work in [employer].  He has had significant pain in the shoulder since the surgery.  He is able to do a pushup at this point, which is new.  However, he cannot raise it overhead without the sense of something moving or catching.  He feels like the shoulder has not improved any longer in the last few months.  He has a Popeye deformity and he is worried that was causing his pain.  He had no obvious early wound healing issues. He continues to have pain throughout the shoulder.  Symptoms are rated as moderate to severe, and have been worsening.  This is significantly impairing activities of daily living.    Please see clinic note for further details on this patient's care.    He has elected for surgical management.   Past Medical History:  Diagnosis Date   A-fib [facility])    Arthritis    hands,   Cancer ([place])    prostate   Dysrhythmia 08/2019   A-fib from energy medication   History of deep vein thrombosis (DVT) of lower extremity    Past Surgical History:  Procedure Laterality Date   ATRIAL FIBRILLATION ABLATION  2023   Dr. Leander Rams SURGERY  2000   plate in [id number]   KNEE ARTHROPLASTY Right 2023   TOTAL SHOULDER ARTHROPLASTY Left 08/14/2021   Procedure: LEFT TOTAL SHOULDER ARTHROPLASTY;  Surgeon: [name], MD;  Location: [facility];  Service: Orthopedics;  Laterality: Left;   Social History   Socioeconomic History   Marital status: Married    Spouse name: Not on file    Number of children: Not on file   Years of education: Not on file   Highest education level: Not on file  Occupational History   Not on file  Tobacco Use   Smoking status: Never   Smokeless tobacco: Never  Vaping Use   Vaping Use: Never used  Substance and Sexual Activity   Alcohol use: Yes    Comment: rare   Drug use: Yes    Frequency: 1.0 times per week    Types: Marijuana   Sexual activity: Yes  Other Topics Concern   Not on file  Social History Narrative   Not on file   Social Determinants of Health   Financial Resource Strain: Not on file  Food Insecurity: Not on file  Transportation Needs: Not on file  Physical Activity: Not on file  Stress: Not on file  Social Connections: Not on file   No family history on file. Allergies  Allergen Reactions   Hydromorphone Hcl Nausea And Vomiting   Oxycodone Nausea And Vomiting and Rash   Oxycodone-Acetaminophen Nausea And Vomiting and Rash   Prior to Admission medications   Medication Sig Start Date End Date Taking? Authorizing Provider  fluticasone (FLONASE) 50 MCG/ACT nasal spray Place 1 spray into both nostrils daily as needed for allergies or rhinitis.   Yes [provider]  tamsulosin (FLOMAX) 0.4 MG CAPS capsule Take 0.4  mg by mouth at bedtime. 06/27/21  Yes [provider]    ROS: All other systems have been reviewed and were otherwise negative with the exception of those mentioned in the HPI and as above.  Physical Exam: General: Alert, no acute distress Cardiovascular: No pedal edema Respiratory: No cyanosis, no use of accessory musculature GI: No organomegaly, abdomen is soft and non-tender Skin: No lesions in the area of chief complaint Neurologic: Sensation intact distally Psychiatric: Patient is competent for consent with normal mood and affect Lymphatic: No axillary or cervical lymphadenopathy  MUSCULOSKELETAL:  He has an obvious Popeye deformity.  He has a relatively medial total  shoulder arthroplasty, deltopectoral incision.  Range of motion of the shoulder is to 160 degrees, but he has pain during descent.  He has external rotation of 45 degrees.  Internal rotation to T8.  He is grossly weak with subscapularis testing.    Imaging: Reviewed x-rays and CT which demonstrate reasonably well positioned implants.  There is fluid lying behind the glenoid component that is concerning for loosening.  There are no obvious signs of bone resorption.    BMI: Estimated body mass index is 29.99 kg/m as calculated from the following:   Height as of 08/21/22: '5\' 10"'$  (1.778 m).   Weight as of 08/21/22: 94.8 kg.  No results found for: "ALBUMIN" Diabetes: Patient does not have a diagnosis of diabetes.     Smoking Status:   reports that he has never smoked. He has never used smokeless tobacco.    Assessment: failed hardware  Plan: Plan for Procedure(s): TOTAL SHOULDER REVISION  The risks benefits and alternatives were discussed with the patient including but not limited to the risks of nonoperative treatment, versus surgical intervention including infection, bleeding, nerve injury,  blood clots, cardiopulmonary complications, morbidity, mortality, among others, and they were willing to proceed.   We additionally specifically discussed risks of axillary nerve injury, infection, periprosthetic fracture, continued pain and longevity of implants prior to beginning procedure.    Patient will be closely monitored in PACU for medical stabilization and pain control. If found stable in PACU, patient may be discharged home with outpatient follow-up. If any concerns regarding patient's stabilization patient will be admitted for observation after surgery. The patient is planning to be discharged home with outpatient PT.    The patient acknowledged the explanation, agreed to proceed with the plan and consent was signed.   He received operative clearance from his PCP, DR. Tyson  Operative  Plan: Left shoulder revision to reverse total shoulder arthroplasty  Discharge Medications: standard DVT Prophylaxis: resume Eliquis Physical Therapy: outpatient PT Special Discharge needs: sling. Ice man   Ethelda Chick, [place]  08/27/2022 3:53 PM

## 2022-08-28 ENCOUNTER — Encounter (HOSPITAL_COMMUNITY): Payer: Self-pay | Admitting: Orthopaedic Surgery

## 2022-08-28 ENCOUNTER — Other Ambulatory Visit: Payer: Self-pay

## 2022-08-28 ENCOUNTER — Encounter (HOSPITAL_COMMUNITY)
Admission: RE | Disposition: A | Discharge status: Home / Self Care | Payer: Self-pay | Source: Home / Self Care | Attending: Orthopaedic Surgery

## 2022-08-28 ENCOUNTER — Inpatient Hospital Stay (HOSPITAL_COMMUNITY): Payer: Medicare Other

## 2022-08-28 ENCOUNTER — Inpatient Hospital Stay (HOSPITAL_COMMUNITY): Payer: Medicare Other | Admitting: Physician Assistant

## 2022-08-28 ENCOUNTER — Inpatient Hospital Stay (HOSPITAL_COMMUNITY)
Admission: RE | Admit: 2022-08-28 | Discharge: 2022-08-28 | DRG: 483 | Disposition: A | Discharge status: Home / Self Care | Payer: Medicare Other | Attending: Orthopaedic Surgery | Admitting: Orthopaedic Surgery

## 2022-08-28 ENCOUNTER — Inpatient Hospital Stay (HOSPITAL_COMMUNITY): Payer: Medicare Other | Admitting: Anesthesiology

## 2022-08-28 ENCOUNTER — Other Ambulatory Visit (HOSPITAL_COMMUNITY): Payer: Self-pay

## 2022-08-28 DIAGNOSIS — Y792 Prosthetic and other implants, materials and accessory orthopedic devices associated with adverse incidents: Secondary | ICD-10-CM | POA: Diagnosis present

## 2022-08-28 DIAGNOSIS — Z7901 Long term (current) use of anticoagulants: Secondary | ICD-10-CM

## 2022-08-28 DIAGNOSIS — Z96612 Presence of left artificial shoulder joint: Secondary | ICD-10-CM | POA: Diagnosis present

## 2022-08-28 DIAGNOSIS — I4891 Unspecified atrial fibrillation: Secondary | ICD-10-CM | POA: Diagnosis present

## 2022-08-28 DIAGNOSIS — T84018A Broken internal joint prosthesis, other site, initial encounter: Secondary | ICD-10-CM

## 2022-08-28 DIAGNOSIS — Z01818 Encounter for other preprocedural examination: Secondary | ICD-10-CM

## 2022-08-28 DIAGNOSIS — Z8546 Personal history of malignant neoplasm of prostate: Secondary | ICD-10-CM

## 2022-08-28 DIAGNOSIS — Z96651 Presence of right artificial knee joint: Secondary | ICD-10-CM | POA: Diagnosis present

## 2022-08-28 DIAGNOSIS — Z86718 Personal history of other venous thrombosis and embolism: Secondary | ICD-10-CM

## 2022-08-28 DIAGNOSIS — I4510 Unspecified right bundle-branch block: Secondary | ICD-10-CM

## 2022-08-28 DIAGNOSIS — T84098A Other mechanical complication of other internal joint prosthesis, initial encounter: Principal | ICD-10-CM | POA: Diagnosis present

## 2022-08-28 HISTORY — PX: TOTAL SHOULDER REVISION: SHX6130

## 2022-08-28 LAB — TYPE AND SCREEN
ABO/RH(D): A POS
Antibody Screen: NEGATIVE

## 2022-08-28 SURGERY — REVISION, TOTAL ARTHROPLASTY, SHOULDER
Anesthesia: General | Site: Shoulder | Laterality: Left

## 2022-08-28 MED ORDER — LACTATED RINGERS IV SOLN: INTRAVENOUS | Status: DC

## 2022-08-28 MED ORDER — GLYCOPYRROLATE 0.2 MG/ML IJ SOLN
INTRAMUSCULAR | Status: AC
  Filled 2022-08-28: qty 1

## 2022-08-28 MED ORDER — METOCLOPRAMIDE HCL 5 MG/ML IJ SOLN
5.0000 mg | Freq: Three times a day (TID) | INTRAMUSCULAR | Status: DC | PRN
  Administered 2022-08-28: 10 mg via INTRAVENOUS

## 2022-08-28 MED ORDER — PHENYLEPHRINE HCL (PRESSORS) 10 MG/ML IV SOLN
INTRAVENOUS | Status: AC
  Filled 2022-08-28: qty 1

## 2022-08-28 MED ORDER — AMISULPRIDE (ANTIEMETIC) 5 MG/2ML IV SOLN
INTRAVENOUS | Status: AC
  Filled 2022-08-28: qty 4

## 2022-08-28 MED ORDER — EPHEDRINE 5 MG/ML INJ
INTRAVENOUS | Status: AC
  Filled 2022-08-28: qty 15

## 2022-08-28 MED ORDER — CEFAZOLIN SODIUM-DEXTROSE 2-4 GM/100ML-% IV SOLN
2.0000 g | INTRAVENOUS | Status: AC
  Administered 2022-08-28: 2 g via INTRAVENOUS
  Filled 2022-08-28: qty 100

## 2022-08-28 MED ORDER — ACETAMINOPHEN 500 MG PO TABS: 1000.0000 mg | ORAL_TABLET | Freq: Once | ORAL | Status: DC

## 2022-08-28 MED ORDER — SODIUM CHLORIDE 0.9 % IR SOLN
Status: DC | PRN
  Administered 2022-08-28: 1000 mL

## 2022-08-28 MED ORDER — MIDAZOLAM HCL 5 MG/5ML IJ SOLN
INTRAMUSCULAR | Status: DC | PRN
  Administered 2022-08-28: 1 mg via INTRAVENOUS

## 2022-08-28 MED ORDER — 0.9 % SODIUM CHLORIDE (POUR BTL) OPTIME
TOPICAL | Status: DC | PRN
  Administered 2022-08-28: 1000 mL

## 2022-08-28 MED ORDER — SUGAMMADEX SODIUM 200 MG/2ML IV SOLN
INTRAVENOUS | Status: DC | PRN
  Administered 2022-08-28: 200 mg via INTRAVENOUS

## 2022-08-28 MED ORDER — GABAPENTIN 300 MG PO CAPS
300.0000 mg | ORAL_CAPSULE | Freq: Once | ORAL | Status: AC
  Administered 2022-08-28: 300 mg via ORAL
  Filled 2022-08-28: qty 1

## 2022-08-28 MED ORDER — DOXYCYCLINE HYCLATE 100 MG PO CAPS
100.0000 mg | ORAL_CAPSULE | Freq: Two times a day (BID) | ORAL | 0 refills | Status: DC
  Filled 2022-08-28: qty 28, 14d supply, fill #0

## 2022-08-28 MED ORDER — VASOPRESSIN 20 UNIT/ML IV SOLN
INTRAVENOUS | Status: AC
  Filled 2022-08-28: qty 1

## 2022-08-28 MED ORDER — PROPOFOL 10 MG/ML IV BOLUS
INTRAVENOUS | Status: DC | PRN
  Administered 2022-08-28: 160 mg via INTRAVENOUS
  Administered 2022-08-28: 40 mg via INTRAVENOUS

## 2022-08-28 MED ORDER — ACETAMINOPHEN 500 MG PO TABS
1000.0000 mg | ORAL_TABLET | Freq: Once | ORAL | Status: AC
  Administered 2022-08-28: 1000 mg via ORAL
  Filled 2022-08-28: qty 2

## 2022-08-28 MED ORDER — ACETAMINOPHEN 500 MG PO TABS
1000.0000 mg | ORAL_TABLET | Freq: Three times a day (TID) | ORAL | 0 refills | Status: AC
  Filled 2022-08-28: qty 84, 14d supply, fill #0

## 2022-08-28 MED ORDER — METOCLOPRAMIDE HCL 5 MG/ML IJ SOLN
INTRAMUSCULAR | Status: AC
  Filled 2022-08-28: qty 2

## 2022-08-28 MED ORDER — FENTANYL CITRATE PF 50 MCG/ML IJ SOSY: 25.0000 ug | PREFILLED_SYRINGE | INTRAMUSCULAR | Status: DC | PRN

## 2022-08-28 MED ORDER — ONDANSETRON HCL 4 MG/2ML IJ SOLN
INTRAMUSCULAR | Status: DC | PRN
  Administered 2022-08-28: 4 mg via INTRAVENOUS

## 2022-08-28 MED ORDER — METOCLOPRAMIDE HCL 5 MG PO TABS: 5.0000 mg | ORAL_TABLET | Freq: Three times a day (TID) | ORAL | Status: DC | PRN

## 2022-08-28 MED ORDER — TRANEXAMIC ACID-NACL 1000-0.7 MG/100ML-% IV SOLN
1000.0000 mg | INTRAVENOUS | Status: AC
  Administered 2022-08-28: 1000 mg via INTRAVENOUS
  Filled 2022-08-28: qty 100

## 2022-08-28 MED ORDER — GABAPENTIN 100 MG PO CAPS
100.0000 mg | ORAL_CAPSULE | Freq: Three times a day (TID) | ORAL | 0 refills | Status: DC
  Filled 2022-08-28: qty 42, 14d supply, fill #0

## 2022-08-28 MED ORDER — EPHEDRINE SULFATE (PRESSORS) 50 MG/ML IJ SOLN
INTRAMUSCULAR | Status: DC | PRN
  Administered 2022-08-28: 5 mg via INTRAVENOUS
  Administered 2022-08-28: 20 mg via INTRAVENOUS
  Administered 2022-08-28 (×2): 10 mg via INTRAVENOUS
  Administered 2022-08-28: 5 mg via INTRAVENOUS
  Administered 2022-08-28: 10 mg via INTRAVENOUS
  Administered 2022-08-28: 5 mg via INTRAVENOUS

## 2022-08-28 MED ORDER — OXYCODONE HCL 5 MG PO TABS
ORAL_TABLET | ORAL | 0 refills | Status: AC
  Filled 2022-08-28: qty 30, 5d supply, fill #0

## 2022-08-28 MED ORDER — DEXAMETHASONE SODIUM PHOSPHATE 4 MG/ML IJ SOLN
INTRAMUSCULAR | Status: DC | PRN
  Administered 2022-08-28: 8 mg via INTRAVENOUS

## 2022-08-28 MED ORDER — VANCOMYCIN HCL 1000 MG IV SOLR
INTRAVENOUS | Status: AC
  Filled 2022-08-28: qty 20

## 2022-08-28 MED ORDER — ONDANSETRON HCL 4 MG PO TABS
4.0000 mg | ORAL_TABLET | Freq: Three times a day (TID) | ORAL | 0 refills | Status: DC | PRN
  Filled 2022-08-28: qty 20, 7d supply, fill #0

## 2022-08-28 MED ORDER — FENTANYL CITRATE (PF) 100 MCG/2ML IJ SOLN
INTRAMUSCULAR | Status: AC
  Filled 2022-08-28: qty 2

## 2022-08-28 MED ORDER — MIDAZOLAM HCL 2 MG/2ML IJ SOLN
1.0000 mg | INTRAMUSCULAR | Status: DC
  Administered 2022-08-28: 2 mg via INTRAVENOUS
  Filled 2022-08-28: qty 2

## 2022-08-28 MED ORDER — METHOCARBAMOL 500 MG PO TABS: 500.0000 mg | ORAL_TABLET | Freq: Four times a day (QID) | ORAL | Status: DC | PRN

## 2022-08-28 MED ORDER — VANCOMYCIN HCL 1000 MG IV SOLR
INTRAVENOUS | Status: DC | PRN
  Administered 2022-08-28: 1000 mg via TOPICAL

## 2022-08-28 MED ORDER — ORAL CARE MOUTH RINSE: 15.0000 mL | Freq: Once | OROMUCOSAL | Status: AC

## 2022-08-28 MED ORDER — FENTANYL CITRATE PF 50 MCG/ML IJ SOSY
50.0000 ug | PREFILLED_SYRINGE | INTRAMUSCULAR | Status: DC
  Filled 2022-08-28: qty 2

## 2022-08-28 MED ORDER — GLYCOPYRROLATE 0.2 MG/ML IJ SOLN
INTRAMUSCULAR | Status: DC | PRN
  Administered 2022-08-28: .2 mg via INTRAVENOUS

## 2022-08-28 MED ORDER — BUPIVACAINE LIPOSOME 1.3 % IJ SUSP
INTRAMUSCULAR | Status: DC | PRN
  Administered 2022-08-28: 10 mL via PERINEURAL

## 2022-08-28 MED ORDER — PROPOFOL 10 MG/ML IV BOLUS
INTRAVENOUS | Status: AC
  Filled 2022-08-28: qty 20

## 2022-08-28 MED ORDER — AMISULPRIDE (ANTIEMETIC) 5 MG/2ML IV SOLN
10.0000 mg | Freq: Once | INTRAVENOUS | Status: AC | PRN
  Administered 2022-08-28: 10 mg via INTRAVENOUS

## 2022-08-28 MED ORDER — METHOCARBAMOL 500 MG IVPB - SIMPLE MED: 500.0000 mg | Freq: Four times a day (QID) | INTRAVENOUS | Status: DC | PRN

## 2022-08-28 MED ORDER — PHENYLEPHRINE HCL-NACL 20-0.9 MG/250ML-% IV SOLN
INTRAVENOUS | Status: DC | PRN
  Administered 2022-08-28: 40 ug/min via INTRAVENOUS

## 2022-08-28 MED ORDER — CELECOXIB 100 MG PO CAPS
100.0000 mg | ORAL_CAPSULE | Freq: Two times a day (BID) | ORAL | 0 refills | Status: DC
  Filled 2022-08-28: qty 28, 14d supply, fill #0

## 2022-08-28 MED ORDER — PRONTOSAN WOUND IRRIGATION OPTIME
TOPICAL | Status: DC | PRN
  Administered 2022-08-28: 350 mL

## 2022-08-28 MED ORDER — BUPIVACAINE HCL (PF) 0.5 % IJ SOLN
INTRAMUSCULAR | Status: DC | PRN
  Administered 2022-08-28: 10 mL via PERINEURAL

## 2022-08-28 MED ORDER — MIDAZOLAM HCL 2 MG/2ML IJ SOLN
INTRAMUSCULAR | Status: AC
  Filled 2022-08-28: qty 2

## 2022-08-28 MED ORDER — ROCURONIUM BROMIDE 10 MG/ML (PF) SYRINGE
PREFILLED_SYRINGE | INTRAVENOUS | Status: DC | PRN
  Administered 2022-08-28: 60 mg via INTRAVENOUS

## 2022-08-28 MED ORDER — PHENYLEPHRINE 80 MCG/ML (10ML) SYRINGE FOR IV PUSH (FOR BLOOD PRESSURE SUPPORT)
PREFILLED_SYRINGE | INTRAVENOUS | Status: DC | PRN
  Administered 2022-08-28 (×3): 160 ug via INTRAVENOUS

## 2022-08-28 MED ORDER — CHLORHEXIDINE GLUCONATE 0.12 % MT SOLN
15.0000 mL | Freq: Once | OROMUCOSAL | Status: AC
  Administered 2022-08-28: 15 mL via OROMUCOSAL

## 2022-08-28 MED ORDER — LIDOCAINE 2% (20 MG/ML) 5 ML SYRINGE
INTRAMUSCULAR | Status: DC | PRN
  Administered 2022-08-28: 20 mg via INTRAVENOUS

## 2022-08-28 MED ORDER — FENTANYL CITRATE (PF) 100 MCG/2ML IJ SOLN
INTRAMUSCULAR | Status: DC | PRN
  Administered 2022-08-28: 100 ug via INTRAVENOUS
  Administered 2022-08-28: 50 ug via INTRAVENOUS

## 2022-08-28 SURGICAL SUPPLY — 100 items
AID PSTN UNV HD RSTRNT DISP (MISCELLANEOUS) ×1
APL SKNCLS STERI-STRIP NONHPOA (GAUZE/BANDAGES/DRESSINGS) ×1
BAG COUNTER SPONGE SURGICOUNT (BAG) ×1 IMPLANT
BAG SPNG CNTER NS LX DISP (BAG) ×1
BENZOIN TINCTURE PRP APPL 2/3 (GAUZE/BANDAGES/DRESSINGS) ×1 IMPLANT
BIT DRILL 3.2 PERIPHERAL SCREW (BIT) IMPLANT
BLADE SAW SAG 29X58X.64 (BLADE) IMPLANT
BLADE SAW SGTL 73X25 THK (BLADE) ×1 IMPLANT
BLADE SAW SGTL 81X20 HD (BLADE) ×1 IMPLANT
BRUSH FEMORAL CANAL (MISCELLANEOUS) IMPLANT
BSPLAT GLND +3 29 (Joint) ×1 IMPLANT
CEMENT BONE DEPUY (Cement) IMPLANT
CLSR STERI-STRIP ANTIMIC 1/2X4 (GAUZE/BANDAGES/DRESSINGS) ×1 IMPLANT
CNTNR URN SCR LID CUP LEK RST (MISCELLANEOUS) ×3 IMPLANT
CONT SPEC 4OZ STRL OR WHT (MISCELLANEOUS) ×3
COVER BACK TABLE 60X90IN (DRAPES) IMPLANT
COVER SURGICAL LIGHT HANDLE (MISCELLANEOUS) ×1 IMPLANT
CUP HUM REV SHLD 3/4 39 +3 (Cup) IMPLANT
DRAPE C-ARM 42X120 X-RAY (DRAPES) IMPLANT
DRAPE INCISE IOBAN 66X45 STRL (DRAPES) ×1 IMPLANT
DRAPE ORTHO SPLIT 77X108 STRL (DRAPES) ×2
DRAPE SHEET LG 3/4 BI-LAMINATE (DRAPES) IMPLANT
DRAPE SURG ORHT 6 SPLT 77X108 (DRAPES) ×2 IMPLANT
DRAPE TOP 10253 STERILE (DRAPES) ×1 IMPLANT
DRAPE U-SHAPE 47X51 STRL (DRAPES) ×1 IMPLANT
DRSG AQUACEL AG ADV 3.5X10 (GAUZE/BANDAGES/DRESSINGS) IMPLANT
DRSG MEPILEX POST OP 4X8 (GAUZE/BANDAGES/DRESSINGS) IMPLANT
DURAPREP 26ML APPLICATOR (WOUND CARE) ×2 IMPLANT
ELECT BLADE TIP CTD 4 INCH (ELECTRODE) ×1 IMPLANT
ELECT NDL TIP 2.8 STRL (NEEDLE) ×1 IMPLANT
ELECT NEEDLE TIP 2.8 STRL (NEEDLE) ×1 IMPLANT
ELECT PENCIL ROCKER SW 15FT (MISCELLANEOUS) IMPLANT
ELECT REM PT RETURN 15FT ADLT (MISCELLANEOUS) ×1 IMPLANT
EVACUATOR 1/8 PVC DRAIN (DRAIN) IMPLANT
FACESHIELD WRAPAROUND (MASK) ×2 IMPLANT
FACESHIELD WRAPAROUND OR TEAM (MASK) ×2 IMPLANT
GAUZE PAD ABD 8X10 STRL (GAUZE/BANDAGES/DRESSINGS) ×1 IMPLANT
GAUZE SPONGE 4X4 12PLY STRL (GAUZE/BANDAGES/DRESSINGS) ×1 IMPLANT
GAUZE XEROFORM 1X8 LF (GAUZE/BANDAGES/DRESSINGS) IMPLANT
GLENOID BASEPLATE 29 +3 (Joint) IMPLANT
GLENOSPHERE STANDARD 39 (Joint) ×1 IMPLANT
GLENOSPHERE STD 39 (Joint) IMPLANT
GLOVE BIO SURGEON STRL SZ 6.5 (GLOVE) ×1 IMPLANT
GLOVE BIOGEL PI IND STRL 6.5 (GLOVE) ×1 IMPLANT
GLOVE BIOGEL PI IND STRL 8 (GLOVE) ×1 IMPLANT
GLOVE ECLIPSE 8.0 STRL XLNG CF (GLOVE) ×1 IMPLANT
GOWN STRL REUS W/ TWL LRG LVL3 (GOWN DISPOSABLE) ×1 IMPLANT
GOWN STRL REUS W/ TWL XL LVL3 (GOWN DISPOSABLE) ×1 IMPLANT
GOWN STRL REUS W/TWL LRG LVL3 (GOWN DISPOSABLE) ×1
GOWN STRL REUS W/TWL XL LVL3 (GOWN DISPOSABLE) ×1
GUIDEPIN HUM 3X100 (PIN) IMPLANT
GUIDEWIRE GLENOID 2.5X220 (WIRE) IMPLANT
HANDPIECE INTERPULSE COAX TIP (DISPOSABLE)
KIT BASIN OR (CUSTOM PROCEDURE TRAY) ×1 IMPLANT
KIT STABILIZATION SHOULDER (MISCELLANEOUS) ×1 IMPLANT
KIT TURNOVER KIT A (KITS) IMPLANT
MANIFOLD NEPTUNE II (INSTRUMENTS) ×1 IMPLANT
NDL 1/2 CIR CATGUT .05X1.09 (NEEDLE) ×1 IMPLANT
NDL MAYO CATGUT SZ4 TPR NDL (NEEDLE) IMPLANT
NEEDLE 1/2 CIR CATGUT .05X1.09 (NEEDLE) ×1 IMPLANT
NEEDLE MAYO CATGUT SZ4 (NEEDLE) ×1 IMPLANT
NS IRRIG 1000ML POUR BTL (IV SOLUTION) ×1 IMPLANT
PACK SHOULDER (CUSTOM PROCEDURE TRAY) ×1 IMPLANT
PAD ARMBOARD 7.5X6 YLW CONV (MISCELLANEOUS) ×2 IMPLANT
RESTRAINT HEAD UNIVERSAL NS (MISCELLANEOUS) ×1 IMPLANT
RETRIEVER SUT HEWSON (MISCELLANEOUS) IMPLANT
SCREW 5.0X38 SMALL F/PERFORM (Screw) IMPLANT
SCREW BONE THREAD 6.5X35 (Screw) IMPLANT
SCREW PERIPHERAL 30 (Screw) IMPLANT
SET HNDPC FAN SPRY TIP SCT (DISPOSABLE) IMPLANT
SLING ARM FOAM STRAP MED (SOFTGOODS) IMPLANT
SLING ARM FOAM STRAP XLG (SOFTGOODS) IMPLANT
SLING ARM IMMOBILIZER LRG (SOFTGOODS) IMPLANT
SLING ARM IMMOBILIZER MED (SOFTGOODS) IMPLANT
SLING ULTRA II L (ORTHOPEDIC SUPPLIES) IMPLANT
SMARTMIX MINI TOWER (MISCELLANEOUS)
SOLUTION PRONTOSAN WOUND 350ML (IRRIGATION / IRRIGATOR) ×1 IMPLANT
SPONGE T-LAP 4X18 ~~LOC~~+RFID (SPONGE) IMPLANT
STEM HUMERAL STD SHORT SZ3 (Joint) IMPLANT
SUPPORT WRAP ARM LG (MISCELLANEOUS) ×1 IMPLANT
SUT ETHIBOND 2 OS 4 DA (SUTURE) ×1 IMPLANT
SUT ETHIBOND 2 V 37 (SUTURE) ×1 IMPLANT
SUT ETHIBOND NAB CT1 #1 30IN (SUTURE) IMPLANT
SUT FIBERWIRE #2 38 T-5 BLUE (SUTURE)
SUT FIBERWIRE #5 38 BLUE (WIRE) IMPLANT
SUT FIBERWIRE #5 38 CONV NDL (SUTURE) ×1
SUT MNCRL AB 4-0 PS2 18 (SUTURE) IMPLANT
SUT VIC AB 0 CT1 27 (SUTURE) ×1
SUT VIC AB 0 CT1 27XBRD ANBCTR (SUTURE) ×1 IMPLANT
SUT VIC AB 2-0 CT1 27 (SUTURE) ×1
SUT VIC AB 2-0 CT1 TAPERPNT 27 (SUTURE) ×1 IMPLANT
SUT VIC AB 3-0 SH 27 (SUTURE) ×1
SUT VIC AB 3-0 SH 27X BRD (SUTURE) ×1 IMPLANT
SUTURE FIBERWR #2 38 T-5 BLUE (SUTURE) IMPLANT
SUTURE FIBERWR #5 38 CONV NDL (SUTURE) IMPLANT
TOWEL OR 17X26 10 PK STRL BLUE (TOWEL DISPOSABLE) ×1 IMPLANT
TOWEL OR NON WOVEN STRL DISP B (DISPOSABLE) ×1 IMPLANT
TOWER SMARTMIX MINI (MISCELLANEOUS) IMPLANT
TUBE SUCTION HIGH CAP CLEAR NV (SUCTIONS) ×1 IMPLANT
WATER STERILE IRR 1000ML POUR (IV SOLUTION) ×1 IMPLANT

## 2022-08-28 NOTE — Discharge Summary (Signed)
Patient ID: Raymond Walsh MRN: [id number] DOB/AGE: [birth year] 67 y.o.  Admit date: 08/28/2022 Discharge date: 08/28/2022  Admission Diagnoses:Left failed total shoulder arthroplasty  Discharge Diagnoses:  Principal Problem:   H/O total shoulder replacement, left   Past Medical History:  Diagnosis Date   A-fib [facility])    Arthritis    hands,   Cancer ([place])    prostate   Dysrhythmia 08/2019   A-fib from energy medication   History of deep vein thrombosis (DVT) of lower extremity      Procedures Performed: Left revision reverse total shoulder arthroplasty  Discharged Condition: good  Hospital Course: Patient brought in as an outpatient for surgery.  Tolerated procedure well.  Was monitored in pacu, required no further antibiotics, was comfortable and in my medical opinion in this patient who no inpatient needs was apppropriate for discharge home as we have historically done for patients.  Patient was instructed on specific activity restrictions and all questions were answered.  Consults: None  Significant Diagnostic Studies: No additional pertinent studies  Treatments: Surgery  Discharge Exam:  Dressing CDI and sling well fitting,  full and painless ROM throughout hand with DPC of 0.  Axillary nerve sensation/motor altered in setting of block and unable to be fully tested.  Distal motor and sensory altered in setting of block.   Disposition: Discharge disposition: 01-Home or Self Care       Discharge Instructions     Call MD for:  redness, tenderness, or signs of infection (pain, swelling, redness, odor or green/yellow discharge around incision site)   Complete by: As directed    Call MD for:  severe uncontrolled pain   Complete by: As directed    Call MD for:  temperature >100.4   Complete by: As directed    Diet - low sodium heart healthy   Complete by: As directed       Allergies as of 08/28/2022       Reactions   Hydromorphone Hcl Nausea And  Vomiting   Oxycodone Nausea And Vomiting, Rash   Oxycodone-acetaminophen Nausea And Vomiting, Rash        Medication List     TAKE these medications    acetaminophen 500 MG tablet Commonly known as: TYLENOL Take 2 tablets (1,000 mg total) by mouth every 8 (eight) hours for 14 days.   celecoxib 100 MG capsule Commonly known as: CeleBREX Take 1 capsule (100 mg total) by mouth 2 (two) times daily for 14 days then take as needed   doxycycline 100 MG capsule Commonly known as: VIBRAMYCIN Take 1 capsule (100 mg total) by mouth 2 (two) times daily for 14 days.   fluticasone 50 MCG/ACT nasal spray Commonly known as: FLONASE Place 1 spray into both nostrils daily as needed for allergies or rhinitis.   gabapentin 100 MG capsule Commonly known as: NEURONTIN Take 1 capsule (100 mg total) by mouth 3 (three) times daily for 14 days. For pain   ondansetron 4 MG tablet Commonly known as: Zofran Take 1 tablet (4 mg total) by mouth every 8 (eight) hours as needed for nausea or vomiting.   oxyCODONE 5 MG immediate release tablet Commonly known as: Oxy IR/ROXICODONE Take 1-2 pills every 6 hours as needed for severe pain, no more than 6 per day. Take benadryl for itching. Take zofran for nausea/vomiting   tamsulosin 0.4 MG Caps capsule Commonly known as: FLOMAX Take 0.4 mg by mouth at bedtime.        Follow-up Information  Hiram Gash, MD Follow up.   Specialty: Orthopedic Surgery Contact information: [address] [place] [address] [phone]

## 2022-08-28 NOTE — Op Note (Signed)
Orthopaedic Surgery Operative Note (CSN: [id number])  Raymond Walsh  [birth year] Date of Surgery: 08/28/2022   Diagnoses:  Failed anatomic total shoulder arthroplasty  Procedure: Left revision reverse total Shoulder Arthroplasty Open synovectomy and synovial biopsy   Operative Finding Successful completion of planned procedure.  Upon examination the subscapularis was quite thin and noncontractile.  It appeared that it was torn/was insufficient.  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  We removed all the implants without issue.  Synovial biopsy and synovectomy were completed and multiple specimens were sent for culture.  There was low suspicion of infection grossly as there was no obvious purulence.  We felt that a one-stage arthroplasty was appropriate.  Post-operative plan: The patient will be NWB in sling.  The patient will be will be discharged from PACU if continues to be stable as was plan prior to surgery.  DVT prophylaxis Aspirin 81 mg twice daily for 6 weeks.  Pain control with PRN pain medication preferring oral medicines.  Follow up plan will be scheduled in approximately 7 days for incision check and XR.  Physical therapy to start after 4 weeks.  Implants: Tornier perform humeral size 3 standard stem, +3 polyethylene, 39 standard glenosphere with a 29+3 baseplate and a 35 center screw with 2 peripheral locking screws.  Post-Op Diagnosis: Same Surgeons:Primary: Hiram Gash, MD Assistants:Caroline McBane PA-C Location: [facility] ROOM 06 Anesthesia: General with Exparel Interscalene Antibiotics: Ancef 2g preop, Vancomycin '1000mg'$  locally Tourniquet time: None Estimated Blood Loss: [id number] Complications: None Specimens:2 for culture, hold for 2 weeks to rule out C acnes   Indications for Surgery:    Raymond Walsh is a 67 y.o. male with previous total arthroplasty with pain.  Benefits and risks of operative and nonoperative management were discussed prior to surgery with patient/guardian(s) and informed consent form was completed.  Infection and need for further surgery were discussed as was prosthetic stability and cuff issues.  We additionally specifically discussed risks of axillary nerve injury, infection, periprosthetic fracture, continued pain and longevity of implants prior to beginning procedure.      Procedure:   The patient was identified in the preoperative holding area where the surgical site was marked. Block placed by anesthesia with exparel.  The patient was taken to the OR where a procedural timeout was called and the above noted anesthesia was induced.  The patient was positioned beachchair on allen table with spider arm positioner.  Preoperative antibiotics were dosed.  The patient's left shoulder was prepped and draped in the usual sterile fashion.  A second preoperative timeout was called.       We began by using the previous upper two thirds of the deltopectoral incision.  Went through skin sharp achieving hemostasis we progressed.  We identified the deltopectoral interval and bluntly dissected taking care to protect both the deltoid and the pec.  We attempted to mobilize the biceps as the patient had a Popeye deformity previously however it was nonmobile and not appropriate for attempted revision biceps tenodesis.   We then continued down taking care to stay lateral and on bone.  The previous incision was relatively medial.  We are able to find the bicipital groove and started a subperiosteal elevation of the anterior capsule as well as the subscapularis remnant using a Bovie electrocautery and a  peel technique.  It was clear that the subscapularis was insufficient and was essentially torn superiorly.  My suspicion is that without any sign of infection and this caused a  loosening of the glenoid component in the patient's pain.  We were able to remove multiple sutures.  Synovectomy was performed multiple specimens were sent for culture.  We were able to then externally rotate and adduct the arm exposing the humeral component.  We are able to dissociate the humeral component and then using a premade osteotome to release the bony ingrowth from the simplicity component and remove it without significant bone loss.  We felt that a primary stem would be appropriate.  We made a 135 mm neck cut using a intramedullary guide.  We then placed a cut protector and turned our attention to the humeral side.  Upper border subscapularis releases were performed however we stayed away from the inferior and deep portions of the subscapularis as it was unable to be infirmed via tug test where the position of the axillary nerve was.  We instead placed blunt retractors just off the glenoid and took care to avoid the axillary nerve and axillary recess.  This point we performed a careful synovectomy on the glenoid and removed any unhealthy appearing tissue.  We released the capsule from the inferior and anterior portions of the glenoid taking care to stay directly on bone to avoid damage to the scapula or the axillary nerve.  This point I was able to grab the polyethylene component which appeared to be intact with a towel clip and with a minor amount of coaxing was able to pull it out in a single piece.  Cement fragments were removed.  We irrigated copiously and performed a synovectomy of this area take multiple specimens.  This point we got down to a solid bleeding bony surface.  We felt that a primary implant would be appropriate.  We placed our center pin in the previous center hole as the position of the glenoid implant was quite reasonable.  I was able to ream and placed a 29+3 baseplate with a 35 center screw with good fixation and over 70% bony coverage of the face of the glenoid.  Once we  placed 2 peripheral locking screws we removed the surrounding bone that might of impinged and placed a 39 standard glenosphere.  This point we placed our blunt retractors again and turned his back to the humerus.  We prepped as a standard for the perform humeral system and were able to place a 3 standard stem with a trial 39 polyethylene which felt appropriate.  No impingement was noted.  We placed our final implants after copiously irrigating.  #5 FiberWire sutures were placed through bone tunnels in the anterior humerus and were able to use to tiedown the anterior capsular/subscapularis remnant.  Once this was complete we irrigated with 3 L normal saline and placed Prontosan solution in the standard fashion.  Local vancomycin powder was placed.  We closed incision multilayer fashion finishing with nonabsorbable suture.  Sterile dressing and sling were placed.  Patient awoken taken back in stable condition.   Noemi Chapel, PA-C, present and scrubbed throughout the case, critical for completion in a timely fashion, and for retraction, instrumentation, closure.

## 2022-08-28 NOTE — Interval H&P Note (Signed)
All questions answered, patient wants to proceed with procedure.  

## 2022-08-28 NOTE — Anesthesia Procedure Notes (Addendum)
Anesthesia Regional Block: Interscalene brachial plexus block   Pre-Anesthetic Checklist: , timeout performed,  Correct Patient, Correct Site, Correct Laterality,  Correct Procedure, Correct Position, site marked,  Risks and benefits discussed,  Surgical consent,  Pre-op evaluation,  At surgeon's request and post-op pain management  Laterality: Left  Prep: chloraprep       Needles:  Injection technique: Single-shot  Needle Type: Echogenic Stimulator Needle     Needle Length: 9cm  Needle Gauge: 21     Additional Needles:   Procedures:,,,, ultrasound used (permanent image in chart),,    Narrative:  Start time: 08/28/2022 9:02 AM End time: 08/28/2022 9:06 AM Injection made incrementally with aspirations every 5 mL.  Performed by: Personally  Anesthesiologist: Nilda Simmer, MD  Additional Notes: Discussed risks and benefits of nerve block including, but not limited to, prolonged and/or permanent nerve injury involving sensory and/or motor function. Monitors were applied and a time-out was performed. The nerve and associated structures were visualized under ultrasound guidance. After negative aspiration, local anesthetic was slowly injected around the nerve. There was no evidence of high pressure during the procedure. There were no paresthesias. VSS remained stable and the patient tolerated the procedure well.

## 2022-08-28 NOTE — Anesthesia Postprocedure Evaluation (Signed)
Anesthesia Post Note  Patient: Filomeno R Mooneyham  Procedure(s) Performed: TOTAL SHOULDER REVISION (Left: Shoulder)     Patient location during evaluation: PACU Anesthesia Type: General and Regional Level of consciousness: awake Pain management: pain level controlled Vital Signs Assessment: post-procedure vital signs reviewed and stable Respiratory status: spontaneous breathing, nonlabored ventilation and respiratory function stable Cardiovascular status: blood pressure returned to baseline and stable Postop Assessment: no apparent nausea or vomiting Anesthetic complications: yes   Encounter Notable Events  Notable Event Outcome Phase Comment  Difficult to intubate - expected  Intraprocedure Filed from anesthesia note documentation.    Last Vitals:  Vitals:   08/28/22 1245 08/28/22 1300  BP: 107/77   Pulse: 92 78  Resp: 19 (!) 23  Temp: (!) 36.4 C   SpO2: 90% 90%    Last Pain:  Vitals:   08/28/22 1245  TempSrc:   PainSc: 0-No pain                 Nilda Simmer

## 2022-08-28 NOTE — Transfer of Care (Signed)
Immediate Anesthesia Transfer of Care Note  Patient: Raymond Walsh  Procedure(s) Performed: Procedure(s): TOTAL SHOULDER REVISION (Left)  Patient Location: PACU  Anesthesia Type:general, regional  Level of Consciousness: Patient easily awoken, sedated, comfortable, cooperative, following commands, responds to stimulation.   Airway & Oxygen Therapy: Patient spontaneously breathing, ventilating well, oxygen via simple oxygen mask.  Post-op Assessment: Report given to PACU RN, vital signs reviewed and stable, moving all extremities.   Post vital signs: Reviewed and stable.  Complications: No apparent anesthesia complications Last Vitals:  Vitals Value Taken Time  BP 101/66 08/28/22 1207  Temp    Pulse 86 08/28/22 1210  Resp 23 08/28/22 1210  SpO2 97 % 08/28/22 1210  Vitals shown include unvalidated device data.  Last Pain:  Vitals:   08/28/22 0942  TempSrc:   PainSc: 0-No pain         Complications:  Encounter Notable Events  Notable Event Outcome Phase Comment  Difficult to intubate - expected  Intraprocedure Filed from anesthesia note documentation.

## 2022-08-28 NOTE — Discharge Instructions (Addendum)
Ophelia Charter MD, MPH Noemi Chapel, PA-C [facility] [address] [phone] (tel)   [phone] (fax)   [place] may leave the operative dressing in place until your follow-up appointment. KEEP THE INCISIONS CLEAN AND DRY. There may be a small amount of fluid/bleeding leaking at the surgical site. This is normal after surgery.  If it fills with liquid or blood please call us immediately to change it for you. Use the provided ice machine or Ice packs as often as possible for the first 3-4 days, then as needed for pain relief.   Keep a layer of cloth or a shirt between your skin and the cooling unit to prevent frost bite as it can get very cold.  SHOWERING: - You may shower on Post-Op Day #2.  - The dressing is water resistant but do not scrub it as it may start to peel up.   - You may remove the sling for showering - Gently pat the area dry.  - Do not soak the shoulder in water.  - Do not go swimming in the pool or ocean until your incision has completely healed (about 4-6 weeks after surgery) - KEEP THE INCISIONS CLEAN AND DRY.  EXERCISES Wear the sling at all times  You may remove the sling for showering, but keep the arm across the chest or in a secondary sling.    Accidental/Purposeful External Rotation and shoulder flexion (reaching behind you) is to be avoided at all costs for the first month. It is ok to come out of your sling if your are sitting and have assistance for eating.   Do not lift anything heavier than 1 pound until we discuss it further in clinic.  It is normal for your fingers/hand to become more swollen after surgery and discolored from bruising.   This will resolve over the first few weeks usually after surgery. Please continue to ambulate and do not stay sitting or lying for too long.  Perform foot and wrist pumps to assist in circulation.  PHYSICAL  THERAPY - No therapy for 4 weeks after surgery  REGIONAL ANESTHESIA (NERVE BLOCKS) The anesthesia team may have performed a nerve block for you this is a great tool used to minimize pain.   The block may start wearing off overnight (between 8-24 hours postop) When the block wears off, your pain may go from nearly zero to the pain you would have had postop without the block. This is an abrupt transition but nothing dangerous is happening.   This can be a challenging period but utilize your as needed pain medications to try and manage this period. We suggest you use the pain medication the first night prior to going to bed, to ease this transition.  You may take an extra dose of narcotic when this happens if needed   POST-OP MEDICATIONS- Multimodal approach to pain control In general your pain will be controlled with a combination of substances.  Prescriptions unless otherwise discussed are electronically sent to your pharmacy.  This is a carefully made plan we use to minimize narcotic use.     Celebrex - Anti-inflammatory medication taken on a scheduled basis Acetaminophen - Non-narcotic pain medicine taken on a scheduled basis  Gabapentin - this is to help with nerve based pain, take on a scheduled basis Oxycodone - This is a strong narcotic, to be used only on an "as needed" basis for SEVERE  pain. Take benadryl for any itching side effects Take zofran for any nausea/vomiting side effects Zofran -  take as needed for nausea Doxycycline - this is an antibiotic, take as instructed  You may resume Eliquis 24 hours after surgery  FOLLOW-UP If you develop a Fever (>101.5), Redness or Drainage from the surgical incision site, please call our office to arrange for an evaluation. Please call the office to schedule a follow-up appointment for a wound check, 7-10 days post-operatively.  IF YOU HAVE ANY QUESTIONS, PLEASE FEEL FREE TO CALL OUR OFFICE.  HELPFUL INFORMATION  Your arm will be in  a sling following surgery. You will be in this sling for the next 4 weeks.   You may be more comfortable sleeping in a semi-seated position the first few nights following surgery.  Keep a pillow propped under the elbow and forearm for comfort.  If you have a recliner type of chair it might be beneficial.  If not that is fine too, but it would be helpful to sleep propped up with pillows behind your operated shoulder as well under your elbow and forearm.  This will reduce pulling on the suture lines.  When dressing, put your operative arm in the sleeve first.  When getting undressed, take your operative arm out last.  Loose fitting, button-down shirts are recommended.  In most states it is against the law to drive while your arm is in a sling. And certainly against the law to drive while taking narcotics.  You may return to work/school in the next couple of days when you feel up to it. Desk work and typing in the sling is fine.  We suggest you use the pain medication the first night prior to going to bed, in order to ease any pain when the anesthesia wears off. You should avoid taking pain medications on an empty stomach as it will make you nauseous.  You should wean off your narcotic medicines as soon as you are able.     Most patients will be off or using minimal narcotics before their first postop appointment.   Do not drink alcoholic beverages or take illicit drugs when taking pain medications.  Pain medication may make you constipated.  Below are a few solutions to try in this order: Decrease the amount of pain medication if you aren't having pain. Drink lots of decaffeinated fluids. Drink prune juice and/or each dried prunes  If the first 3 don't work start with additional solutions Take Colace - an over-the-counter stool softener Take Senokot - an over-the-counter laxative Take Miralax - a stronger over-the-counter laxative   Dental Antibiotics:  In most cases prophylactic  antibiotics for Dental procdeures after total joint surgery are not necessary.  Exceptions are as follows:  1. History of prior total joint infection  2. Severely immunocompromised (Organ Transplant, cancer chemotherapy, Rheumatoid biologic meds such as [place])  3. Poorly controlled diabetes (A1C &gt; 8.0, blood glucose over 200)  If you have one of these conditions, contact your surgeon for an antibiotic prescription, prior to your dental procedure.   For more information including helpful videos and documents visit our website:   [email or web address]

## 2022-08-28 NOTE — Anesthesia Procedure Notes (Addendum)
Procedure Name: Intubation Date/Time: 08/28/2022 10:27 AM  Performed by: Deliah Boston, CRNAPre-anesthesia Checklist: Patient identified, Emergency Drugs available, Suction available and Patient being monitored Patient Re-evaluated:Patient Re-evaluated prior to induction Oxygen Delivery Method: Circle system utilized Preoxygenation: Pre-oxygenation with 100% oxygen Induction Type: IV induction Ventilation: Mask ventilation without difficulty and Oral airway inserted - appropriate to patient size Laryngoscope Size: Mac and 4 Grade View: Grade II Tube type: Oral Tube size: 7.5 mm Number of attempts: 1 Airway Equipment and Method: Stylet, Oral airway and Bougie stylet Placement Confirmation: ETT inserted through vocal cords under direct vision, positive ETCO2 and breath sounds checked- equal and bilateral Secured at: 22 cm Tube secured with: Tape Dental Injury: Teeth and Oropharynx as per pre-operative assessment  Difficulty Due To: Difficulty was anticipated and Difficult Airway- due to reduced neck mobility

## 2022-08-29 ENCOUNTER — Encounter (HOSPITAL_COMMUNITY): Payer: Self-pay | Admitting: Orthopaedic Surgery

## 2022-09-02 LAB — AEROBIC/ANAEROBIC CULTURE W GRAM STAIN (SURGICAL/DEEP WOUND)

## 2022-09-03 ENCOUNTER — Encounter: Payer: Self-pay | Admitting: Family

## 2022-09-03 ENCOUNTER — Ambulatory Visit (INDEPENDENT_AMBULATORY_CARE_PROVIDER_SITE_OTHER): Payer: Medicare Other | Admitting: Family

## 2022-09-03 ENCOUNTER — Telehealth: Payer: Self-pay

## 2022-09-03 ENCOUNTER — Other Ambulatory Visit: Payer: Self-pay

## 2022-09-03 VITALS — BP 124/92 | HR 83 | Temp 98.3°F | Resp 16 | Wt 205.0 lb

## 2022-09-03 DIAGNOSIS — T8450XA Infection and inflammatory reaction due to unspecified internal joint prosthesis, initial encounter: Secondary | ICD-10-CM

## 2022-09-03 DIAGNOSIS — Z96612 Presence of left artificial shoulder joint: Secondary | ICD-10-CM | POA: Diagnosis not present

## 2022-09-03 MED ORDER — DOXYCYCLINE HYCLATE 100 MG PO CAPS: 100.0000 mg | ORAL_CAPSULE | Freq: Two times a day (BID) | ORAL | 1 refills | Status: DC

## 2022-09-03 NOTE — Progress Notes (Signed)
Subjective:    Patient ID: Raymond Walsh, male    DOB: 1955/08/11, 67 y.o.   MRN: [id number]  Chief Complaint  Patient presents with   New Patient (Initial Visit)    Left shoulder pji.     HPI:  Raymond Walsh is a 67 y.o. male with previous medical history of prostate cancer and atrial fibrillation s/p ablation, DVT s/p treatment with Eliquis presenting today for prosthetic joint shoulder infection.  Mr. Raymond Walsh initially underwent left total shoulder arthoplasty in February 2023 with continued pain since the initial surgery. Unable to raise left shoulder overhead without sense of moving/catching and little to no improvements. CT left shoulder in October 2023 with small amount of contrast between glenoid and arthroplasty component as can be seen with early loosening. Brought to the OR by Dr. Griffin Basil on 08/28/22 for left revision reverse total shoulder arthroplasty. All implants were removed and synovial biopsy was sent for culture with low suspicion of infection grossly as there was no obvious purulence and one stage arthroplasty was performed. 1 out of 3 surgical cultures grew Cutibacterium acnes. Placed on doxycycline and has been referred to ID for further evaluation and treatment.   Mr. Raymond Walsh has been doing okay since surgery and taking the doxycycline as prescribed with no adverse side effects. Denies any systemic symptoms of fevers or chills. Has some swelling in the left shoulder. Has follow up with Dr. Griffin Basil in a couple of days. Has frustrations about his current situation and the impact it has had on his daily life.    Allergies  Allergen Reactions   Hydromorphone Hcl Nausea And Vomiting   Oxycodone Nausea And Vomiting and Rash   Oxycodone-Acetaminophen Nausea And Vomiting and Rash      Outpatient Medications Prior to Visit  Medication Sig Dispense Refill   acetaminophen (TYLENOL) 500 MG tablet Take 2 tablets (1,000 mg total) by mouth every 8 (eight) hours for 14  days. 84 tablet 0   tamsulosin (FLOMAX) 0.4 MG CAPS capsule Take 0.4 mg by mouth at bedtime.     celecoxib (CELEBREX) 100 MG capsule Take 1 capsule (100 mg total) by mouth 2 (two) times daily for 14 days then take as needed (Patient not taking: Reported on 09/03/2022) 28 capsule 0   fluticasone (FLONASE) 50 MCG/ACT nasal spray Place 1 spray into both nostrils daily as needed for allergies or rhinitis. (Patient not taking: Reported on 09/03/2022)     gabapentin (NEURONTIN) 100 MG capsule Take 1 capsule (100 mg total) by mouth 3 (three) times daily for 14 days. For pain (Patient not taking: Reported on 09/03/2022) 42 capsule 0   ondansetron (ZOFRAN) 4 MG tablet Take 1 tablet (4 mg total) by mouth every 8 (eight) hours as needed for nausea or vomiting. (Patient not taking: Reported on 09/03/2022) 20 tablet 0   doxycycline (VIBRAMYCIN) 100 MG capsule Take 1 capsule (100 mg total) by mouth 2 (two) times daily for 14 days. (Patient not taking: Reported on 09/03/2022) 28 capsule 0   No facility-administered medications prior to visit.     Past Medical History:  Diagnosis Date   A-fib [facility])    Arthritis    hands,   Cancer ([place])    prostate   Dysrhythmia 08/2019   A-fib from energy medication   History of deep vein thrombosis (DVT) of lower extremity      Past Surgical History:  Procedure Laterality Date   ATRIAL FIBRILLATION ABLATION  2023   Dr. Elonda Husky  BACK SURGERY  2000   plate in QA348G   KNEE ARTHROPLASTY Right 2023   TOTAL SHOULDER ARTHROPLASTY Left 08/14/2021   Procedure: LEFT TOTAL SHOULDER ARTHROPLASTY;  Surgeon: [name], MD;  Location: [facility];  Service: Orthopedics;  Laterality: Left;   TOTAL SHOULDER REVISION Left 08/28/2022   Procedure: TOTAL SHOULDER REVISION;  Surgeon: Hiram Gash, MD;  Location: [facility];  Service: Orthopedics;  Laterality: Left;       Review of Systems  Constitutional:  Negative for chills, diaphoresis, fatigue and fever.  Respiratory:  Negative for  cough, chest tightness, shortness of breath and wheezing.   Cardiovascular:  Negative for chest pain.  Gastrointestinal:  Negative for abdominal pain, diarrhea, nausea and vomiting.      Objective:    BP (!) 124/92   Pulse 83   Temp 98.3 F (36.8 C) (Oral)   Resp 16   Wt 205 lb (93 kg)   SpO2 98%   BMI 28.59 kg/m  Nursing note and vital signs reviewed.  Physical Exam Constitutional:      General: He is not in acute distress.    Appearance: He is well-developed.  Cardiovascular:     Rate and Rhythm: Normal rate and regular rhythm.     Heart sounds: Normal heart sounds.  Pulmonary:     Effort: Pulmonary effort is normal.     Breath sounds: Normal breath sounds.  Musculoskeletal:     Comments: Left shoulder in sling. Post-surgical wrap in place.   Skin:    General: Skin is warm and dry.  Neurological:     Mental Status: He is alert and oriented to person, place, and time.  Psychiatric:        Behavior: Behavior normal.        Thought Content: Thought content normal.        Judgment: Judgment normal.         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 Currently on doxycycline. Reviewed cultures and discussed plan of care to include 2 weeks of IV antibiotics with Ceftriaxone followed by oral antibiotics for the remainder of treatment. Suspect burden of infection is not very high as only 1 of 3  cultures was positive. PICC line placement arranged for 09/13/22 and OPAT orders placed for Home Health. Continue current dose of doxycycline until PICC line placement and start of Ceftriaxone. Continue post-operative wound care per Dr. Griffin Basil. Follow up in 3-4 weeks or sooner if needed near end of treatment with Ceftriaxone.       Relevant Orders   IR PICC PLACEMENT RIGHT >5 YRS INC IMG GUIDE     I have changed Tyrek R. Brett's doxycycline. I am also having him maintain his tamsulosin, fluticasone, ondansetron, acetaminophen, gabapentin, and celecoxib.   Meds ordered this encounter  Medications  doxycycline (VIBRAMYCIN) 100 MG capsule    Sig: Take 1 capsule (100 mg total) by mouth 2 (two) times daily.    Dispense:  60 capsule    Refill:  1    Order Specific Question:   Supervising Provider    Answer:   Carlyle Basques [[id number]]    Diagnosis: Prosthetic Joint Infection  Culture Result: P. Acnes   Allergies  Allergen Reactions   Hydromorphone Hcl Nausea And Vomiting   Oxycodone Nausea And Vomiting and Rash   Oxycodone-Acetaminophen Nausea And Vomiting and Rash    OPAT Orders Discharge antibiotics to be given via PICC line Discharge antibiotics: Ceftriaxone 2 g IV q 24  Per pharmacy protocol   Duration: 2 weeks End Date: 09/25/22 (Assuming start 09/13/22)  PIC Care Per Protocol:  Home health RN for IV administration and teaching; PICC line care and labs.    Labs weekly while on IV antibiotics: _X_ CBC with differential _X_ BMP __ CMP _X_ CRP _X_ ESR __ Vancomycin trough __ CK  __ Please pull PIC at completion of IV antibiotics _X_ Please leave PIC in place until doctor has seen patient or been notified  Fax weekly labs to [phone]   Follow-up: Return in about 1 month (around 10/04/2022), or if symptoms worsen or fail to improve.   Terri Piedra, MSN, FNP-C Nurse Practitioner [facility] [place]  number: [phone]

## 2022-09-03 NOTE — Patient Instructions (Signed)
Nice to see you.  Continue to take your doxycycline  We will arrange for a PICC line and get you started on Ceftriaxone.  Continue with wound care per Dr. Griffin Basil.  Have a great day and stay safe!

## 2022-09-03 NOTE — Telephone Encounter (Signed)
Thank you :)

## 2022-09-03 NOTE — Telephone Encounter (Signed)
New OPAT orders per Terri Piedra, NP, orders shared with Carolynn Sayers, RN at [facility] and [facility] staff.   IR appointment: 09/11/2022 at 2:30 PM at [facility] - appointment time and location provided to both patient and [facility].   First dose: patient will need short stay - waiting to hear back from Carolynn Sayers, RN to see if can be done in home.    [place], CMA

## 2022-09-03 NOTE — Assessment & Plan Note (Addendum)
Mr. 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 Currently on doxycycline. Reviewed cultures and discussed plan of care to include 2 weeks of IV antibiotics with Ceftriaxone followed by oral antibiotics for the remainder of treatment. Suspect burden of infection is not very high as only 1 of 3 cultures was positive. PICC line placement arranged for 09/13/22 and OPAT orders placed for Home Health. Continue current dose of doxycycline until PICC line placement and start of Ceftriaxone. Continue post-operative wound care per Dr. Griffin Basil. Follow up in 3-4 weeks or sooner if needed near end of treatment with Ceftriaxone.

## 2022-09-04 LAB — AEROBIC/ANAEROBIC CULTURE W GRAM STAIN (SURGICAL/DEEP WOUND)

## 2022-09-10 ENCOUNTER — Other Ambulatory Visit (HOSPITAL_COMMUNITY): Payer: Self-pay | Admitting: Student

## 2022-09-11 ENCOUNTER — Ambulatory Visit (HOSPITAL_COMMUNITY)
Admission: RE | Admit: 2022-09-11 | Discharge: 2022-09-11 | Disposition: A | Discharge status: Home / Self Care | Payer: Medicare Other | Source: Ambulatory Visit | Attending: Family | Admitting: Family

## 2022-09-11 DIAGNOSIS — Y839 Surgical procedure, unspecified as the cause of abnormal reaction of the patient, or of later complication, without mention of misadventure at the time of the procedure: Secondary | ICD-10-CM | POA: Insufficient documentation

## 2022-09-11 DIAGNOSIS — T8450XA Infection and inflammatory reaction due to unspecified internal joint prosthesis, initial encounter: Secondary | ICD-10-CM | POA: Insufficient documentation

## 2022-09-11 MED ORDER — HEPARIN SOD (PORK) LOCK FLUSH 100 UNIT/ML IV SOLN
INTRAVENOUS | Status: AC
  Administered 2022-09-11: 250 [IU]
  Filled 2022-09-11: qty 5

## 2022-09-11 MED ORDER — LIDOCAINE HCL 1 % IJ SOLN
INTRAMUSCULAR | Status: AC
  Administered 2022-09-11: 5 mL
  Filled 2022-09-11: qty 20

## 2022-09-14 LAB — AEROBIC/ANAEROBIC CULTURE W GRAM STAIN (SURGICAL/DEEP WOUND): Culture: NO GROWTH

## 2022-09-19 ENCOUNTER — Ambulatory Visit: Payer: Medicare Other | Admitting: Physical Therapy

## 2022-09-25 ENCOUNTER — Other Ambulatory Visit: Payer: Self-pay

## 2022-09-25 ENCOUNTER — Encounter: Payer: Self-pay | Admitting: Physical Therapy

## 2022-09-25 ENCOUNTER — Ambulatory Visit: Payer: Medicare Other | Attending: Orthopaedic Surgery | Admitting: Physical Therapy

## 2022-09-25 DIAGNOSIS — R600 Localized edema: Secondary | ICD-10-CM | POA: Diagnosis not present

## 2022-09-25 DIAGNOSIS — M6281 Muscle weakness (generalized): Secondary | ICD-10-CM

## 2022-09-25 DIAGNOSIS — Z96619 Presence of unspecified artificial shoulder joint: Secondary | ICD-10-CM | POA: Insufficient documentation

## 2022-09-25 DIAGNOSIS — R29898 Other symptoms and signs involving the musculoskeletal system: Secondary | ICD-10-CM

## 2022-09-25 NOTE — Therapy (Signed)
OUTPATIENT PHYSICAL THERAPY SHOULDER EVALUATION   Patient Name: Raymond Walsh MRN: [id number] DOB:10/18/1955, 67 y.o., male Today's Date: 09/25/2022  END OF SESSION:  PT End of Session - 09/25/22 1305     Visit Number 1    Number of Visits 12    Date for PT Re-Evaluation 11/06/22    Authorization Type medicare    Authorization - Visit Number 1    Progress Note Due on Visit 10    PT Start Time 1154    PT Stop Time 1230    PT Time Calculation (min) 36 min    Activity Tolerance Patient tolerated treatment well    Behavior During Therapy WFL for tasks assessed/performed             Past Medical History:  Diagnosis Date   A-fib ([place])    Arthritis    hands,   Cancer ([place])    prostate   Dysrhythmia 08/2019   A-fib from energy medication   History of deep vein thrombosis (DVT) of lower extremity    Past Surgical History:  Procedure Laterality Date   ATRIAL FIBRILLATION ABLATION  2023   Dr. 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/2022   Greater trochanteric pain syndrome of left lower extremity 10/20/2019    PCP: Harvel Ricks  REFERRING PROVIDER: Ophelia Charter  REFERRING DIAG: s/p Lt reverse total shoulder  THERAPY DIAG:  Muscle weakness (generalized)  Localized edema  Other symptoms and signs involving the musculoskeletal system  Rationale for Evaluation and Treatment: Rehabilitation  ONSET DATE: 08/28/22  SUBJECTIVE:                                                                                                                                                                                       SUBJECTIVE STATEMENT: Pt with long standing LT shoulder pain. He had reverse total shoulder 08/28/22. Pt states since surgery he has had very little pain. 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 He had an infection and currently has a picc line. Picc line to be removed tomorrow.  PERTINENT HISTORY: Shoulder surgery 08/14/22  PAIN:  Are you having pain? No  PRECAUTIONS: Other: Protocol in file. No resisted extension or IR for 3 months  WEIGHT BEARING RESTRICTIONS: No  FALLS:  Has patient fallen in last 6 months? No  OCCUPATION: Futures trader  PLOF: Independent  PATIENT GOALS:improve function of Lt UE  NEXT MD VISIT: 09/26/22  OBJECTIVE:   POSTURE: Rounded shoulders, forward head  UPPER EXTREMITY ROM:   Active ROM Right eval Left eval  Shoulder flexion  143  Shoulder extension    Shoulder abduction  120  Shoulder adduction    Shoulder internal rotation    Shoulder external rotation  35  Elbow flexion    Elbow extension    Wrist flexion    Wrist extension    Wrist ulnar deviation    Wrist radial deviation    Wrist pronation    Wrist supination    (Blank rows = not tested)  UPPER EXTREMITY MMT: Tested against gravity - no resistance on eval MMT Right eval Left eval  Shoulder flexion  3  Shoulder extension    Shoulder abduction  3  Shoulder adduction    Shoulder internal rotation    Shoulder external rotation  3  Middle trapezius    Lower trapezius    Elbow flexion    Elbow extension    Wrist flexion    Wrist extension    Wrist ulnar deviation    Wrist radial deviation    Wrist pronation    Wrist supination    Grip strength (lbs)    (Blank rows = not tested)   JOINT MOBILITY TESTING:  Shoulder jt mobility WFL  PALPATION:  No TTP  shoulder joint   TODAY'S TREATMENT:                                                                                                                                         DATE: 09/25/22 See HEP Reviewed protocol and precautions   PATIENT EDUCATION: Education details: PT POC and goals, HEP Person educated: Patient Education method: Explanation, Demonstration, and Handouts Education comprehension: verbalized understanding and returned demonstration  HOME EXERCISE PROGRAM: Access Code: [email or web address] URL: [email or web address] Date: 09/25/2022 Prepared by: Isabelle Course  Exercises - Standing Single Arm Shoulder Flexion with Posterior Anchored Resistance  - 1 x daily - 7 x weekly - 3 sets - 10 reps - Standing Single Arm Shoulder Abduction with Resistance  - 1 x daily - 7 x weekly - 3 sets - 10 reps - Shoulder External Rotation Reactive Isometrics  - 1 x daily - 7 x weekly - 3 sets - 10 reps - Standing Shoulder Row with Anchored Resistance  - 1 x daily - 7 x weekly - 3 sets - 10 reps - Standing Shoulder External Rotation Stretch in Doorway  - 1 x daily - 7 x weekly - 1 sets - 10 reps -  10 seconds hold - Standing Single Arm Shoulder Flexion Stretch on Wall  - 1 x daily - 7 x weekly - 1 sets - 10 reps - 10 sec hold  ASSESSMENT:  CLINICAL IMPRESSION: Patient is a 67 y.o. male who was seen today for physical therapy evaluation and treatment for s/p reverse total shoulder replacement 08/28/22. Pt presents with decreased ROM and strength, decreased functional activity tolerance and decreased use of Lt UE. Pt will beneift from skilled PT to address deficits and progress towards prior level of function.   OBJECTIVE IMPAIRMENTS: decreased activity tolerance, decreased ROM, decreased strength, increased edema, and impaired UE functional use.   ACTIVITY LIMITATIONS: carrying, lifting, and reach over head  PARTICIPATION LIMITATIONS: community activity, occupation, and yard  work  PERSONAL FACTORS: Past/current experiences and Time since onset of injury/illness/exacerbation are also affecting patient's functional outcome.   REHAB POTENTIAL: Good  CLINICAL DECISION MAKING: Stable/uncomplicated  EVALUATION COMPLEXITY: Low   GOALS: Goals reviewed with patient? Yes  SHORT TERM GOALS: Target date: 10/09/2022    Pt will be independent in initial HEP Baseline: Goal status: INITIAL   LONG TERM GOALS: Target date: 11/06/2022    Pt will improve strength to 4+/5 to return to full work out routine Baseline:  Goal status: INITIAL  2.  Pt will be independent in advanced HEP Baseline:  Goal status: INITIAL  3.  Pt will improve Lt shoulder ROM to 160 degrees flexion and abduction, 60 degrees ER to improve mobility for IADLs Baseline:  Goal status: INITIAL   PLAN:  PT FREQUENCY: 1-2x/week  PT DURATION: 6 weeks  PLANNED INTERVENTIONS: Therapeutic exercises, Therapeutic activity, Neuromuscular re-education, Balance training, Gait training, Patient/Family education, Self Care, Joint mobilization, Aquatic Therapy, Dry Needling, Electrical stimulation, Cryotherapy, Moist heat, Taping, Vasopneumatic device, Ultrasound, Ionotophoresis 4mg /ml Dexamethasone, Manual therapy, and Re-evaluation  PLAN FOR NEXT SESSION: Progress shoulder strength per protocol, ROM as tolerated   Hercules Hasler, PT 09/25/2022, 1:08 PM

## 2022-09-30 ENCOUNTER — Telehealth: Payer: Self-pay

## 2022-09-30 MED ORDER — ONDANSETRON 4 MG PO TBDP: 4.0000 mg | ORAL_TABLET | Freq: Four times a day (QID) | ORAL | 0 refills | Status: DC | PRN

## 2022-09-30 NOTE — Telephone Encounter (Signed)
Received voicemail from [place] with [facility] wanting to know if PICC line can be pulled. EOT was 09/25/22, patient's follow up appointment is not scheduled until 10/14/22.Mamie NickFE:[phone]  Beryle Flock, RN

## 2022-09-30 NOTE — Telephone Encounter (Signed)
Spoke with [place], he would like to stick with the doxycycline and add ondansetron as needed. States he already has the doxycycline prescription.   Beryle Flock, RN

## 2022-09-30 NOTE — Telephone Encounter (Addendum)
Relayed verbal orders to pull PICC to [place] with [facility]. Sent orders to Carolynn Sayers, RN with [facility] and updated [facility] pharmacy team.   Spoke with patient, updated him that PICC can be removed and that Marya Amsler would like for him to start doxycycline. He would like to know if there is any alternative as it makes him nauseous. He would prefer an alternative, but is open to trying it with anti-emetic medication if there is no other choice.   Beryle Flock, RN

## 2022-09-30 NOTE — Addendum Note (Signed)
Addended by: Lucie Leather D on: 09/30/2022 04:44 PM   Modules accepted: Orders

## 2022-10-02 ENCOUNTER — Ambulatory Visit: Payer: Medicare Other | Attending: Orthopaedic Surgery

## 2022-10-02 DIAGNOSIS — R29898 Other symptoms and signs involving the musculoskeletal system: Secondary | ICD-10-CM | POA: Diagnosis present

## 2022-10-02 DIAGNOSIS — M25561 Pain in right knee: Secondary | ICD-10-CM | POA: Insufficient documentation

## 2022-10-02 DIAGNOSIS — R293 Abnormal posture: Secondary | ICD-10-CM

## 2022-10-02 DIAGNOSIS — M25512 Pain in left shoulder: Secondary | ICD-10-CM | POA: Insufficient documentation

## 2022-10-02 DIAGNOSIS — R600 Localized edema: Secondary | ICD-10-CM | POA: Diagnosis present

## 2022-10-02 DIAGNOSIS — M6281 Muscle weakness (generalized): Secondary | ICD-10-CM | POA: Diagnosis not present

## 2022-10-02 DIAGNOSIS — G8929 Other chronic pain: Secondary | ICD-10-CM | POA: Diagnosis present

## 2022-10-02 NOTE — Therapy (Signed)
OUTPATIENT PHYSICAL THERAPY SHOULDER EVALUATION   Patient Name: Raymond Walsh MRN: [id number] DOB:1955-10-16, 67 y.o., male Today's Date: 10/02/2022  END OF SESSION:  PT End of Session - 10/02/22 1059     Visit Number 2    Number of Visits 12    Date for PT Re-Evaluation 11/06/22    Authorization Type medicare    Progress Note Due on Visit 10    PT Start Time 1100    PT Stop Time 1155    PT Time Calculation (min) 55 min    Activity Tolerance Patient tolerated treatment well    Behavior During Therapy WFL for tasks assessed/performed             Past Medical History:  Diagnosis Date   A-fib    Arthritis    hands,   Cancer    prostate   Dysrhythmia 08/2019   A-fib from energy medication   History of deep vein thrombosis (DVT) of lower extremity    Past Surgical History:  Procedure Laterality Date   ATRIAL FIBRILLATION ABLATION  2023   Dr. 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   H/O total shoulder replacement, left 08/28/2022   Greater trochanteric pain syndrome of left lower extremity 10/20/2019    PCP: Harvel Ricks  REFERRING PROVIDER: Ophelia Charter  REFERRING DIAG: s/p Lt reverse total shoulder  THERAPY DIAG:  Muscle weakness (generalized)  Localized edema  Other symptoms and signs involving the musculoskeletal system  Chronic left shoulder pain  Abnormal posture  Rationale for Evaluation and Treatment: Rehabilitation  ONSET DATE: 08/28/22  SUBJECTIVE:                                                                                                                                                                                       SUBJECTIVE STATEMENT: Patient reports no pain in L shoulder today. Patient states he reviewed HEP; states he is back to driving and doing a lot of his everyday chores.   PERTINENT HISTORY: Shoulder surgery 08/14/22  PAIN:  Are you having pain? No  PRECAUTIONS: Other: Protocol in file. No resisted extension or IR for 3 months  WEIGHT BEARING RESTRICTIONS: No  FALLS:  Has patient fallen in last 6 months? No  OCCUPATION: Futures trader  PLOF: Independent  PATIENT GOALS:improve function of Lt UE  NEXT MD VISIT: 09/26/22  OBJECTIVE:   POSTURE: Rounded shoulders, forward head  UPPER EXTREMITY ROM:   Active ROM Right eval Left eval  Shoulder flexion  143  Shoulder extension    Shoulder abduction  120  Shoulder adduction    Shoulder internal rotation    Shoulder external rotation  35  Elbow flexion    Elbow extension    Wrist flexion    Wrist extension    Wrist ulnar deviation    Wrist radial deviation    Wrist pronation    Wrist supination    (Blank rows = not tested)  UPPER EXTREMITY MMT: Tested against gravity - no resistance on eval MMT Right eval Left eval  Shoulder flexion  3  Shoulder extension    Shoulder abduction  3  Shoulder adduction    Shoulder internal rotation    Shoulder external rotation  3  Middle trapezius    Lower trapezius    Elbow flexion    Elbow extension    Wrist flexion    Wrist extension    Wrist ulnar deviation    Wrist radial deviation    Wrist pronation    Wrist supination    Grip strength (lbs)    (Blank rows = not tested)   JOINT MOBILITY TESTING:  Shoulder jt mobility WFL  PALPATION:  No TTP shoulder joint   TODAY'S TREATMENT:     [facility] Adult PT Treatment:                                                DATE: 10/02/2022 Therapeutic  Exercise: Passive L shoulder ROM (no IR/extension) Standing L arm flexion stretch at wall 2x30" Shoulder isometrics: flexion, abd, ER 10x5" each  Shoulder flexion, abd, ER x10 each YTB Modalities: Gamer Ready L shoulder, 34 degrees, low compression x 10 min                                                                                        DATE: 09/25/22 See HEP Reviewed protocol and precautions   PATIENT EDUCATION: Education details: PT POC and goals, HEP Person educated: Patient Education method: Explanation, Demonstration, and Handouts Education comprehension: verbalized understanding and returned demonstration  HOME EXERCISE PROGRAM: Access Code: [email or web address] URL: [email or web address] Date: 09/25/2022 Prepared by: Isabelle Course  Exercises - Standing Single Arm Shoulder Flexion with Posterior Anchored Resistance  - 1 x daily - 7 x weekly - 3 sets - 10 reps - Standing Single Arm Shoulder Abduction with Resistance  - 1 x daily - 7 x weekly - 3 sets - 10 reps - Shoulder External Rotation Reactive Isometrics  - 1 x daily - 7 x weekly - 3 sets - 10 reps - Standing Shoulder Row with Anchored Resistance  - 1 x daily - 7 x weekly - 3 sets - 10 reps -  Standing Shoulder External Rotation Stretch in Doorway  - 1 x daily - 7 x weekly - 1 sets - 10 reps - 10 seconds hold - Standing Single Arm Shoulder Flexion Stretch on Wall  - 1 x daily - 7 x weekly - 1 sets - 10 reps - 10 sec hold  ASSESSMENT:  CLINICAL IMPRESSION:  Shoulder strengthening progressed per Phase II protocol. Light resistance added with shoulder flexion, abduction and ER. Tactile and verbal cues provided throughout session to decrease upper trapezius compensation and improve postural alignment and awareness. Discussion with patient on importance of staying within Phase II protocol restrictions.    OBJECTIVE IMPAIRMENTS: decreased activity tolerance, decreased ROM, decreased strength, increased edema, and  impaired UE functional use.   ACTIVITY LIMITATIONS: carrying, lifting, and reach over head  PARTICIPATION LIMITATIONS: community activity, occupation, and yard work  PERSONAL FACTORS: Past/current experiences and Time since onset of injury/illness/exacerbation are also affecting patient's functional outcome.   REHAB POTENTIAL: Good  CLINICAL DECISION MAKING: Stable/uncomplicated  EVALUATION COMPLEXITY: Low   GOALS: Goals reviewed with patient? Yes  SHORT TERM GOALS: Target date: 10/09/2022  Pt will be independent in initial HEP Baseline: Goal status: INITIAL   LONG TERM GOALS: Target date: 11/06/2022  Pt will improve strength to 4+/5 to return to full work out routine Baseline:  Goal status: INITIAL  2.  Pt will be independent in advanced HEP Baseline:  Goal status: INITIAL  3.  Pt will improve Lt shoulder ROM to 160 degrees flexion and abduction, 60 degrees ER to improve mobility for IADLs Baseline:  Goal status: INITIAL   PLAN:  PT FREQUENCY: 1-2x/week  PT DURATION: 6 weeks  PLANNED INTERVENTIONS: Therapeutic exercises, Therapeutic activity, Neuromuscular re-education, Balance training, Gait training, Patient/Family education, Self Care, Joint mobilization, Aquatic Therapy, Dry Needling, Electrical stimulation, Cryotherapy, Moist heat, Taping, Vasopneumatic device, Ultrasound, Ionotophoresis 4mg /ml Dexamethasone, Manual therapy, and Re-evaluation  PLAN FOR NEXT SESSION: Progress shoulder strength per protocol, ROM as tolerated   Helane Gunther, PTA 10/02/2022 11:00AM

## 2022-10-07 ENCOUNTER — Ambulatory Visit: Payer: Medicare Other

## 2022-10-07 DIAGNOSIS — R29898 Other symptoms and signs involving the musculoskeletal system: Secondary | ICD-10-CM

## 2022-10-07 DIAGNOSIS — G8929 Other chronic pain: Secondary | ICD-10-CM

## 2022-10-07 DIAGNOSIS — M6281 Muscle weakness (generalized): Secondary | ICD-10-CM | POA: Diagnosis not present

## 2022-10-07 DIAGNOSIS — R600 Localized edema: Secondary | ICD-10-CM

## 2022-10-07 DIAGNOSIS — R293 Abnormal posture: Secondary | ICD-10-CM

## 2022-10-07 DIAGNOSIS — M25561 Pain in right knee: Secondary | ICD-10-CM

## 2022-10-07 NOTE — Therapy (Signed)
OUTPATIENT PHYSICAL THERAPY SHOULDER TREATMENT   Patient Name: Raymond Walsh MRN: [id number] DOB:01/07/1956, 67 y.o., male Today's Date: 10/07/2022  END OF SESSION:  PT End of Session - 10/07/22 1104     Visit Number 3    Number of Visits 12    Date for PT Re-Evaluation 11/06/22    Authorization Type medicare    Progress Note Due on Visit 10    PT Start Time 1105    PT Stop Time 1155    PT Time Calculation (min) 50 min    Activity Tolerance Patient tolerated treatment well    Behavior During Therapy WFL for tasks assessed/performed             Past Medical History:  Diagnosis Date   A-fib    Arthritis    hands,   Cancer    prostate   Dysrhythmia 08/2019   A-fib from energy medication   History of deep vein thrombosis (DVT) of lower extremity    Past Surgical History:  Procedure Laterality Date   ATRIAL FIBRILLATION ABLATION  2023   Dr. 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   H/O total shoulder replacement, left 08/28/2022   Greater trochanteric pain syndrome of left lower extremity 10/20/2019    PCP: Karsten FellsYarbrough, Craig  REFERRING PROVIDER: Ramond MarrowVarkey, Dax  REFERRING DIAG: s/p Lt reverse total shoulder  THERAPY DIAG:  Muscle weakness (generalized)  Localized edema  Other symptoms and signs involving the musculoskeletal system  Chronic left shoulder pain  Abnormal posture  Acute pain of right knee  Rationale for Evaluation and Treatment: Rehabilitation  ONSET DATE:  08/28/22  SUBJECTIVE:                                                                                                                                                                                      SUBJECTIVE STATEMENT: Patient reports his shoulder is sore today. Patient requested a print out of HEP.  PERTINENT HISTORY: Shoulder surgery 08/14/22  PAIN:  Are you having pain? No  PRECAUTIONS: Other: Protocol in file. No resisted extension or IR for 3 months  WEIGHT BEARING RESTRICTIONS: No  FALLS:  Has patient fallen in last 6 months? No  OCCUPATION: Games developer  PLOF: Independent  PATIENT GOALS:improve function of Lt UE  NEXT MD VISIT: 09/26/22  OBJECTIVE:   POSTURE: Rounded shoulders, forward head  UPPER EXTREMITY ROM:   Active ROM Right eval Left eval  Shoulder flexion  143  Shoulder extension    Shoulder abduction  120  Shoulder adduction    Shoulder internal rotation    Shoulder external rotation  35  Elbow flexion    Elbow extension    Wrist flexion    Wrist extension    Wrist ulnar deviation    Wrist radial deviation    Wrist pronation    Wrist supination    (Blank rows = not tested)  UPPER EXTREMITY MMT: Tested against gravity - no resistance on eval MMT Right eval Left eval  Shoulder flexion  3  Shoulder extension    Shoulder abduction  3  Shoulder adduction    Shoulder internal rotation    Shoulder external rotation  3  Middle trapezius    Lower trapezius    Elbow flexion    Elbow extension    Wrist flexion    Wrist extension    Wrist ulnar deviation    Wrist radial deviation    Wrist pronation    Wrist supination    Grip strength (lbs)    (Blank rows = not tested)   JOINT MOBILITY TESTING:  Shoulder jt mobility WFL  PALPATION:  No TTP shoulder joint   TODAY'S TREATMENT:     [facility] Adult PT Treatment:                                                DATE: 10/07/2022 Therapeutic Exercise: Passive L shoulder ROM:  flex, abd, ER Standing L arm flexion stretch at wall 5x10" Shoulder isometrics: flexion, abd, ER 10x5" each  Shoulder flexion, abd RTB, ER YTB x10 each Manual Therapy: STM pecs Modalities: Gamer Ready L shoulder, 34 degrees, low compression x 10 min    [facility] Adult PT Treatment:                                                DATE: 10/02/2022 Therapeutic Exercise: Passive L shoulder ROM (no IR/extension) Standing L arm flexion stretch at wall 2x30" Shoulder isometrics: flexion, abd, ER 10x5" each  Shoulder flexion, abd, ER x10 each YTB Modalities: Gamer Ready L shoulder, 34 degrees, low compression x 10 min                                                                                        DATE: 09/25/22 See HEP Reviewed protocol and precautions   PATIENT EDUCATION: Education details: PT POC and goals, HEP Person educated: Patient Education method: Explanation, Demonstration, and Handouts Education comprehension: verbalized understanding and returned demonstration  HOME EXERCISE PROGRAM: Access Code: [email or web address] URL: [email or web address] Date:  09/25/2022 Prepared by: Reggy Eye  Exercises - Standing Single Arm Shoulder Flexion with Posterior Anchored Resistance  - 1 x daily - 7 x weekly - 3 sets - 10 reps - Standing Single Arm Shoulder Abduction with Resistance  - 1 x daily - 7 x weekly - 3 sets - 10 reps - Shoulder External Rotation Reactive Isometrics  - 1 x daily - 7 x weekly - 3 sets - 10 reps - Standing Shoulder Row with Anchored Resistance  - 1 x daily - 7 x weekly - 3 sets - 10 reps - Standing Shoulder External Rotation Stretch in Doorway  - 1 x daily - 7 x weekly - 1 sets - 10 reps - 10 seconds hold - Standing Single Arm Shoulder Flexion Stretch on Wall  - 1 x daily - 7 x weekly - 1 sets - 10 reps - 10 sec hold  ASSESSMENT:  CLINICAL IMPRESSION:  Tactile cues provided to decrease upper trapezius compensation during shoulder isometric exercises.  Patient had difficulty maintaining proper shoulder alignment and posture during ER isometric and progression to lightly resisted shoulder ER.   OBJECTIVE IMPAIRMENTS: decreased activity tolerance, decreased ROM, decreased strength, increased edema, and impaired UE functional use.   ACTIVITY LIMITATIONS: carrying, lifting, and reach over head  PARTICIPATION LIMITATIONS: community activity, occupation, and yard work  PERSONAL FACTORS: Past/current experiences and Time since onset of injury/illness/exacerbation are also affecting patient's functional outcome.   REHAB POTENTIAL: Good  CLINICAL DECISION MAKING: Stable/uncomplicated  EVALUATION COMPLEXITY: Low   GOALS: Goals reviewed with patient? Yes  SHORT TERM GOALS: Target date: 10/09/2022  Pt will be independent in initial HEP Baseline: Goal status: MET   LONG TERM GOALS: Target date: 11/06/2022  Pt will improve strength to 4+/5 to return to full work out routine Baseline:  Goal status: INITIAL  2.  Pt will be independent in advanced HEP Baseline:  Goal status: INITIAL  3.  Pt will improve Lt shoulder ROM to 160 degrees flexion and abduction, 60 degrees ER to improve mobility for IADLs Baseline:  Goal status: INITIAL   PLAN:  PT FREQUENCY: 1-2x/week  PT DURATION: 6 weeks  PLANNED INTERVENTIONS: Therapeutic exercises, Therapeutic activity, Neuromuscular re-education, Balance training, Gait training, Patient/Family education, Self Care, Joint mobilization, Aquatic Therapy, Dry Needling, Electrical stimulation, Cryotherapy, Moist heat, Taping, Vasopneumatic device, Ultrasound, Ionotophoresis 4mg /ml Dexamethasone, Manual therapy, and Re-evaluation  PLAN FOR NEXT SESSION: Assess response to HEP. Progress shoulder strength per protocol, ROM as tolerated   Carlynn Herald, PTA 10/07/2022 11:00AM

## 2022-10-09 ENCOUNTER — Encounter: Payer: Self-pay | Admitting: Physical Therapy

## 2022-10-09 ENCOUNTER — Ambulatory Visit: Payer: Medicare Other | Admitting: Physical Therapy

## 2022-10-09 DIAGNOSIS — M6281 Muscle weakness (generalized): Secondary | ICD-10-CM

## 2022-10-09 DIAGNOSIS — R600 Localized edema: Secondary | ICD-10-CM

## 2022-10-09 DIAGNOSIS — G8929 Other chronic pain: Secondary | ICD-10-CM

## 2022-10-09 NOTE — Therapy (Signed)
OUTPATIENT PHYSICAL THERAPY SHOULDER TREATMENT   Patient Name: Raymond Walsh MRN: [id number] DOB:May 05, 1956, 67 y.o., male Today's Date: 10/09/2022  END OF SESSION:  PT End of Session - 10/09/22 1223     Visit Number 4    Number of Visits 12    Date for PT Re-Evaluation 11/06/22    Authorization Type medicare    Authorization - Visit Number 4    Progress Note Due on Visit 10    PT Start Time 1148    PT Stop Time 1230    PT Time Calculation (min) 42 min    Activity Tolerance Patient tolerated treatment well    Behavior During Therapy WFL for tasks assessed/performed              Past Medical History:  Diagnosis Date   A-fib    Arthritis    hands,   Cancer    prostate   Dysrhythmia 08/2019   A-fib from energy medication   History of deep vein thrombosis (DVT) of lower extremity    Past Surgical History:  Procedure Laterality Date   ATRIAL FIBRILLATION ABLATION  2023   Dr. 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   H/O total shoulder replacement, left 08/28/2022   Greater trochanteric pain syndrome of left lower extremity 10/20/2019    PCP: Karsten Fells  REFERRING PROVIDER: Ramond Marrow  REFERRING DIAG: s/p Lt reverse total shoulder  THERAPY DIAG:  Muscle weakness (generalized)  Localized edema  Chronic left shoulder pain  Rationale for Evaluation and Treatment: Rehabilitation  ONSET DATE: 08/28/22  SUBJECTIVE:                                                                                                                                                                                       SUBJECTIVE STATEMENT: Patient reports his shoulder is "fair" today. He states "it's slow going"  PERTINENT HISTORY: Shoulder surgery 08/14/22  PAIN:  Are you having pain? No  PRECAUTIONS: Other: Protocol in file. No resisted extension or IR for 3 months  WEIGHT BEARING RESTRICTIONS: No  FALLS:  Has patient fallen in last 6 months? No  OCCUPATION: Holiday representative  supervisor  PLOF: Independent  PATIENT GOALS:improve function of Lt UE  NEXT MD VISIT: 09/26/22  OBJECTIVE:   POSTURE: Rounded shoulders, forward head  UPPER EXTREMITY ROM:   Active ROM Right eval Left eval  Shoulder flexion  143  Shoulder extension    Shoulder abduction  120  Shoulder adduction    Shoulder internal rotation    Shoulder external rotation  35  Elbow flexion    Elbow extension    Wrist flexion    Wrist extension    Wrist ulnar deviation    Wrist radial deviation    Wrist pronation    Wrist supination    (Blank rows = not tested)  UPPER EXTREMITY MMT: Tested against gravity - no resistance on eval MMT Right eval Left eval  Shoulder flexion  3  Shoulder extension    Shoulder abduction  3  Shoulder adduction    Shoulder internal rotation    Shoulder external rotation  3  Middle trapezius    Lower trapezius    Elbow flexion    Elbow extension    Wrist flexion    Wrist extension    Wrist ulnar deviation    Wrist radial deviation    Wrist pronation    Wrist supination    Grip strength (lbs)    (Blank rows = not tested)   JOINT MOBILITY TESTING:  Shoulder jt mobility WFL  PALPATION:  No TTP shoulder joint   TODAY'S TREATMENT:    [facility] Adult PT Treatment:                                                DATE: 10/09/22 Therapeutic Exercise: Row to neutral red TB x 20 Shoulder ext red TB to neutral x 20 Shoulder flexion, abduction red TB 2 x 10 Shoulder  ER x 10 yellow TB, x 10 no resistance Wall clock no resistance x 10 Serratus wall slide 2 x 10 Supine ER stretch 2 x 30 sec PROM flexion, abd, ER  Modalities: Vaso x 10 min low pressure 34 degrees   [facility] Adult PT Treatment:                                                DATE: 10/07/2022 Therapeutic Exercise: Passive L shoulder ROM: flex, abd, ER Standing L arm flexion stretch at wall 5x10" Shoulder isometrics: flexion, abd, ER 10x5" each  Shoulder flexion, abd RTB, ER YTB x10 each Manual Therapy: STM pecs Modalities: Gamer Ready L shoulder, 34 degrees, low compression x 10 min    [facility] Adult PT Treatment:                                                DATE: 10/02/2022 Therapeutic Exercise: Passive L shoulder ROM (no IR/extension) Standing L arm flexion stretch at wall 2x30" Shoulder isometrics: flexion, abd, ER 10x5" each  Shoulder flexion, abd, ER x10 each YTB Modalities: Gamer Ready L shoulder, 34 degrees, low compression x 10 min  PATIENT EDUCATION: Education details: PT POC and goals, HEP Person educated: Patient Education method: Explanation, Demonstration, and Handouts Education comprehension: verbalized understanding and returned demonstration  HOME EXERCISE PROGRAM: Access Code: [email or web address] URL: [email or web address] Date: 09/25/2022 Prepared by: Reggy EyeKaren Kyleena Scheirer  Exercises - Standing Single Arm Shoulder Flexion with Posterior Anchored Resistance  - 1 x daily - 7 x weekly - 3 sets - 10 reps - Standing Single Arm Shoulder Abduction with Resistance  - 1 x daily - 7 x weekly - 3 sets - 10 reps - Shoulder External Rotation Reactive Isometrics  - 1 x daily - 7 x weekly - 3 sets - 10 reps - Standing Shoulder Row with Anchored Resistance  - 1 x daily - 7 x weekly - 3 sets - 10 reps - Standing Shoulder External Rotation Stretch in Doorway  - 1 x daily - 7 x weekly - 1 sets -  10 reps - 10 seconds hold - Standing Single Arm Shoulder Flexion Stretch on Wall  - 1 x daily - 7 x weekly - 1 sets - 10 reps - 10 sec hold  ASSESSMENT:  CLINICAL IMPRESSION:  Pt with good tolerance to addition of postural strengthening exercises. Difficulty with serratus wall slide with foam roll, improved with UEs on wall. Improving PROM noted   OBJECTIVE IMPAIRMENTS: decreased activity tolerance, decreased ROM, decreased strength, increased edema, and impaired UE functional use.      GOALS: Goals reviewed with patient? Yes  SHORT TERM GOALS: Target date: 10/09/2022  Pt will be independent in initial HEP Baseline: Goal status: MET   LONG TERM GOALS: Target date: 11/06/2022  Pt will improve strength to 4+/5 to return to full work out routine Baseline:  Goal status: INITIAL  2.  Pt will be independent in advanced HEP Baseline:  Goal status: INITIAL  3.  Pt will improve Lt shoulder ROM to 160 degrees flexion and abduction, 60 degrees ER to improve mobility for IADLs Baseline:  Goal status: INITIAL   PLAN:  PT FREQUENCY: 1-2x/week  PT DURATION: 6 weeks  PLANNED INTERVENTIONS: Therapeutic exercises, Therapeutic activity, Neuromuscular re-education, Balance training, Gait training, Patient/Family education, Self Care, Joint mobilization, Aquatic Therapy, Dry Needling, Electrical stimulation, Cryotherapy, Moist heat, Taping, Vasopneumatic device, Ultrasound, Ionotophoresis 4mg /ml Dexamethasone, Manual therapy, and Re-evaluation  PLAN FOR NEXT SESSION: Progress shoulder strength per protocol, ROM as tolerated  Reggy EyeKaren Dannell Gortney, PT,DPT 10/09/2410:24 PM

## 2022-10-14 ENCOUNTER — Encounter: Payer: Self-pay | Admitting: Family

## 2022-10-14 ENCOUNTER — Ambulatory Visit (INDEPENDENT_AMBULATORY_CARE_PROVIDER_SITE_OTHER): Payer: Medicare Other | Admitting: Family

## 2022-10-14 ENCOUNTER — Other Ambulatory Visit: Payer: Self-pay

## 2022-10-14 ENCOUNTER — Encounter: Payer: Self-pay | Admitting: *Deleted

## 2022-10-14 VITALS — BP 112/74 | HR 64 | Temp 97.5°F | Ht 71.0 in | Wt 210.0 lb

## 2022-10-14 DIAGNOSIS — T8450XD Infection and inflammatory reaction due to unspecified internal joint prosthesis, subsequent encounter: Secondary | ICD-10-CM | POA: Diagnosis present

## 2022-10-14 DIAGNOSIS — Z96612 Presence of left artificial shoulder joint: Secondary | ICD-10-CM

## 2022-10-14 MED ORDER — DOXYCYCLINE HYCLATE 100 MG PO CAPS: 100.0000 mg | ORAL_CAPSULE | Freq: Two times a day (BID) | ORAL | 1 refills | Status: DC

## 2022-10-14 NOTE — Patient Instructions (Signed)
Nice to see you.  We will check your lab work today.  Continue to take your medication daily as prescribed.  Plan for follow up in 2 months or sooner if needed with lab work on the same day.  Have a great day and stay safe!  

## 2022-10-14 NOTE — Progress Notes (Unsigned)
   Subjective:    Patient ID: Raymond Walsh, male    DOB: 06/06/1956, 67 y.o.   MRN: [id number]  Chief Complaint  Patient presents with   Follow-up    HPI:  Raymond Stepler Walsh is a 67 y.o. male    Allergies  Allergen Reactions   Hydromorphone Hcl Nausea And Vomiting   Oxycodone Nausea And Vomiting and Rash   Oxycodone-Acetaminophen Nausea And Vomiting and Rash      Outpatient Medications Prior to Visit  Medication Sig Dispense Refill   doxycycline (VIBRAMYCIN) 100 MG capsule Take 1 capsule (100 mg total) by mouth 2 (two) times daily. 60 capsule 1   tamsulosin (FLOMAX) 0.4 MG CAPS capsule Take 0.4 mg by mouth at bedtime.     ondansetron (ZOFRAN-ODT) 4 MG disintegrating tablet Take 1 tablet (4 mg total) by mouth every 6 (six) hours as needed for nausea or vomiting. (Patient not taking: Reported on 10/14/2022) 20 tablet 0   No facility-administered medications prior to visit.     Past Medical History:  Diagnosis Date   A-fib    Arthritis    hands,   Cancer    prostate   Dysrhythmia 08/2019   A-fib from energy medication   History of deep vein thrombosis (DVT) of lower extremity      Past Surgical History:  Procedure Laterality Date   ATRIAL FIBRILLATION ABLATION  2023   Dr. Lanell Persons SURGERY  2000   plate in L5--B2   KNEE ARTHROPLASTY Right 2023   TOTAL SHOULDER ARTHROPLASTY Left 08/14/2021   Procedure: LEFT TOTAL SHOULDER ARTHROPLASTY;  Surgeon: Ernestina Columbia, MD;  Location: [facility];  Service: Orthopedics;  Laterality: Left;   TOTAL SHOULDER REVISION Left 08/28/2022   Procedure: TOTAL SHOULDER REVISION;  Surgeon: Bjorn Pippin, MD;  Location: [facility];  Service: Orthopedics;  Laterality: Left;       Review of Systems    Objective:    BP 112/74   Pulse 64   Temp (!) 97.5 F (36.4 C) (Oral)   Ht 5\' 11"  (1.803 m)   Wt 210 lb (95.3 kg)   SpO2 96%   BMI 29.29 kg/m  Nursing note and vital signs reviewed.  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 maintain his tamsulosin, doxycycline, and ondansetron.   No orders of the defined types were placed in this encounter.    Follow-up: No follow-ups on file.   Marcos Eke, MSN, FNP-C Nurse Practitioner [facility] Main number: [phone]

## 2022-10-15 ENCOUNTER — Ambulatory Visit: Payer: Medicare Other

## 2022-10-15 ENCOUNTER — Encounter: Payer: Self-pay | Admitting: Family

## 2022-10-15 DIAGNOSIS — G8929 Other chronic pain: Secondary | ICD-10-CM

## 2022-10-15 DIAGNOSIS — M6281 Muscle weakness (generalized): Secondary | ICD-10-CM | POA: Diagnosis not present

## 2022-10-15 DIAGNOSIS — R600 Localized edema: Secondary | ICD-10-CM

## 2022-10-15 DIAGNOSIS — R293 Abnormal posture: Secondary | ICD-10-CM

## 2022-10-15 DIAGNOSIS — R29898 Other symptoms and signs involving the musculoskeletal system: Secondary | ICD-10-CM

## 2022-10-15 LAB — SEDIMENTATION RATE: Sed Rate: 6 mm/h (ref 0–20)

## 2022-10-15 LAB — BASIC METABOLIC PANEL WITH GFR
BUN: 20 mg/dL (ref 7–25)
CO2: 26 mmol/L (ref 20–32)
Calcium: 9 mg/dL (ref 8.6–10.3)
Chloride: 102 mmol/L (ref 98–110)
Creat: 1.08 mg/dL (ref 0.70–1.35)
Glucose, Bld: 97 mg/dL (ref 65–99)
Potassium: 4.8 mmol/L (ref 3.5–5.3)
Sodium: 136 mmol/L (ref 135–146)
eGFR: 75 mL/min/{1.73_m2} (ref >=60)

## 2022-10-15 LAB — C-REACTIVE PROTEIN: CRP: <3 mg/L (ref <8.0)

## 2022-10-15 NOTE — Therapy (Signed)
OUTPATIENT PHYSICAL THERAPY SHOULDER TREATMENT   Patient Name: Raymond Walsh MRN: [id number] DOB:26-Jan-1956, 67 y.o., male Today's Date: 10/15/2022  END OF SESSION:  PT End of Session - 10/15/22 1016     Visit Number 5    Number of Visits 12    Date for PT Re-Evaluation 11/06/22    Authorization Type medicare    Authorization - Visit Number 5    Progress Note Due on Visit 10    PT Start Time 1017    PT Stop Time 1105    PT Time Calculation (min) 48 min    Activity Tolerance Patient tolerated treatment well    Behavior During Therapy WFL for tasks assessed/performed              Past Medical History:  Diagnosis Date   A-fib    Arthritis    hands,   Cancer    prostate   Dysrhythmia 08/2019   A-fib from energy medication   History of deep vein thrombosis (DVT) of lower extremity    Past Surgical History:  Procedure Laterality Date   ATRIAL FIBRILLATION ABLATION  2023   Dr. 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   Greater trochanteric pain syndrome of left lower extremity 10/20/2019    PCP: Karsten Fells  REFERRING PROVIDER: Ramond Marrow  REFERRING DIAG: s/p Lt reverse total shoulder  THERAPY DIAG:  Muscle weakness (generalized)  Localized edema  Chronic left shoulder pain  Other symptoms and signs involving the musculoskeletal system  Abnormal posture  Rationale for Evaluation and Treatment: Rehabilitation  ONSET  DATE: 08/28/22  SUBJECTIVE:                                                                                                                                                                                      SUBJECTIVE STATEMENT: Patient reports no significant changes since last visit, states he is impatient with how slow the rehab process is going.   PERTINENT HISTORY: Shoulder surgery 08/14/22  PAIN:  Are you having pain? No  PRECAUTIONS: Other: Protocol in file. No resisted extension or IR for  3 months  WEIGHT BEARING RESTRICTIONS: No  FALLS:  Has patient fallen in last 6 months? No  OCCUPATION: Games developer  PLOF: Independent  PATIENT GOALS:improve function of Lt UE  NEXT MD VISIT: 09/26/22  OBJECTIVE:   POSTURE: Rounded shoulders, forward head  UPPER EXTREMITY ROM:   Active ROM Right eval Left eval  Shoulder flexion  143  Shoulder extension    Shoulder abduction  120  Shoulder adduction    Shoulder internal rotation    Shoulder external rotation  35  Elbow flexion    Elbow extension    Wrist flexion    Wrist extension    Wrist ulnar deviation    Wrist radial deviation    Wrist pronation    Wrist supination    (Blank rows = not tested)  UPPER EXTREMITY MMT: Tested against gravity - no resistance on eval MMT Right eval Left eval  Shoulder flexion  3  Shoulder extension    Shoulder abduction  3  Shoulder adduction    Shoulder internal rotation    Shoulder external rotation  3  Middle trapezius    Lower trapezius    Elbow flexion    Elbow extension    Wrist flexion    Wrist extension    Wrist ulnar deviation    Wrist radial deviation    Wrist pronation    Wrist supination    Grip strength (lbs)    (Blank rows = not tested)   JOINT MOBILITY TESTING:  Shoulder jt mobility WFL  PALPATION:  No TTP shoulder joint   TODAY'S TREATMENT:   [facility] Adult PT Treatment:                                                DATE:  10/15/2022 Therapeutic Exercise: S/L shoulder abd + scapulohumeral assist by therapist --> front arm circles Shoulder flexion finger walk up --> arm slide shoulder flexion Self-massage myofascial ball to L pecs Resisted shoulder abd YTB (focus on postural alignment) Isometric neutral row step back RTB x10 Serratus arm wall slides  Bent arm shoulder flexion YTB Wall clock no resistance x 10 Modalities: Vaso x 10 min, low pressure, 34 degrees     [facility] Adult PT Treatment:                                                DATE: 10/09/22 Therapeutic Exercise: Row to neutral red TB x 20 Shoulder ext red TB to neutral x 20 Shoulder flexion, abduction red TB 2 x 10 Shoulder ER x 10 yellow TB, x 10 no resistance Wall clock no resistance x 10 Serratus wall slide 2 x 10 Supine ER stretch 2 x 30 sec PROM flexion, abd, ER  Modalities: Vaso x 10 min low pressure 34 degrees   [facility] Adult PT Treatment:                                                DATE: 10/07/2022 Therapeutic Exercise: Passive L shoulder ROM: flex, abd, ER Standing L arm flexion stretch at wall 5x10" Shoulder isometrics: flexion, abd, ER  10x5" each  Shoulder flexion, abd RTB, ER YTB x10 each Manual Therapy: STM pecs Modalities: Gamer Ready L shoulder, 34 degrees, low compression x 10 min     PATIENT EDUCATION: Education details: PT POC and goals, HEP Person educated: Patient Education method: Explanation, Demonstration, and Handouts Education comprehension: verbalized understanding and returned demonstration  HOME EXERCISE PROGRAM: Access Code: [email or web address] URL: [email or web address] Date: 09/25/2022 Prepared by: Reggy Eye  Exercises - Standing Single Arm Shoulder Flexion with Posterior Anchored Resistance  - 1 x daily - 7 x weekly - 3 sets - 10 reps - Standing Single Arm Shoulder Abduction with Resistance  - 1 x daily - 7 x weekly - 3 sets - 10 reps - Shoulder External Rotation Reactive Isometrics   - 1 x daily - 7 x weekly - 3 sets - 10 reps - Standing Shoulder Row with Anchored Resistance  - 1 x daily - 7 x weekly - 3 sets - 10 reps - Standing Shoulder External Rotation Stretch in Doorway  - 1 x daily - 7 x weekly - 1 sets - 10 reps - 10 seconds hold - Standing Single Arm Shoulder Flexion Stretch on Wall  - 1 x daily - 7 x weekly - 1 sets - 10 reps - 10 sec hold  ASSESSMENT:  CLINICAL IMPRESSION:  Active assist provided to improve scapulohumeral rhythm during shoulder abduction in side lying. Patient demonstrated difficulty maintaining good postural form and shoulder stability during serratus arm wall slides and bent arm shoulder flexion.    OBJECTIVE IMPAIRMENTS: decreased activity tolerance, decreased ROM, decreased strength, increased edema, and impaired UE functional use.      GOALS: Goals reviewed with patient? Yes  SHORT TERM GOALS: Target date: 10/09/2022  Pt will be independent in initial HEP Baseline: Goal status: MET   LONG TERM GOALS: Target date: 11/06/2022  Pt will improve strength to 4+/5 to return to full work out routine Baseline:  Goal status: INITIAL  2.  Pt will be independent in advanced HEP Baseline:  Goal status: INITIAL  3.  Pt will improve Lt shoulder ROM to 160 degrees flexion and abduction, 60 degrees ER to improve mobility for IADLs Baseline:  Goal status: INITIAL   PLAN:  PT FREQUENCY: 1-2x/week  PT DURATION: 6 weeks  PLANNED INTERVENTIONS: Therapeutic exercises, Therapeutic activity, Neuromuscular re-education, Balance training, Gait training, Patient/Family education, Self Care, Joint mobilization, Aquatic Therapy, Dry Needling, Electrical stimulation, Cryotherapy, Moist heat, Taping, Vasopneumatic device, Ultrasound, Ionotophoresis 4mg /ml Dexamethasone, Manual therapy, and Re-evaluation  PLAN FOR NEXT SESSION: Progress shoulder strength per protocol, ROM as tolerated  Carlynn Herald, PTA 10/14/2408:58 AM

## 2022-10-15 NOTE — Assessment & Plan Note (Signed)
Marin continues to do well with current dose of doxycycline with occasional nausea but has not had to use the ondansetron.  Discussed plan of care to continue doxycycline for another 2 months for a total of 51-month treatment time.  Appears to be progressing well with no signs of active infection.  Check inflammatory markers and renal function.  Reminded to avoid direct sunlight for long periods of time while on doxycycline.  Continue physical therapy and postoperative care per orthopedics.  Plan for follow-up in 2 months or sooner if needed.

## 2022-10-17 ENCOUNTER — Ambulatory Visit: Payer: Medicare Other

## 2022-10-17 DIAGNOSIS — M6281 Muscle weakness (generalized): Secondary | ICD-10-CM | POA: Diagnosis not present

## 2022-10-17 DIAGNOSIS — R29898 Other symptoms and signs involving the musculoskeletal system: Secondary | ICD-10-CM

## 2022-10-17 DIAGNOSIS — R293 Abnormal posture: Secondary | ICD-10-CM

## 2022-10-17 DIAGNOSIS — R600 Localized edema: Secondary | ICD-10-CM

## 2022-10-17 DIAGNOSIS — G8929 Other chronic pain: Secondary | ICD-10-CM

## 2022-10-17 NOTE — Therapy (Signed)
OUTPATIENT PHYSICAL THERAPY SHOULDER TREATMENT   Patient Name: Raymond Walsh MRN: [id number] DOB:Oct 02, 1955, 67 y.o., male Today's Date: 10/17/2022  END OF SESSION:  PT End of Session - 10/17/22 1318     Visit Number 6    Number of Visits 12    Date for PT Re-Evaluation 11/06/22    Authorization Type medicare    Authorization - Visit Number 6    Progress Note Due on Visit 10    PT Start Time 1318    PT Stop Time 1410    PT Time Calculation (min) 52 min    Activity Tolerance Patient tolerated treatment well    Behavior During Therapy WFL for tasks assessed/performed              Past Medical History:  Diagnosis Date   A-fib    Arthritis    hands,   Cancer    prostate   Dysrhythmia 08/2019   A-fib from energy medication   History of deep vein thrombosis (DVT) of lower extremity    Past Surgical History:  Procedure Laterality Date   ATRIAL FIBRILLATION ABLATION  2023   Dr. 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   Greater trochanteric pain syndrome of left lower extremity 10/20/2019    PCP: Karsten Fells  REFERRING PROVIDER: Ramond Marrow  REFERRING DIAG: s/p Lt reverse total shoulder  THERAPY DIAG:  Muscle weakness (generalized)  Localized edema  Chronic left shoulder pain  Other symptoms and signs involving the musculoskeletal system  Abnormal posture  Rationale for Evaluation and Treatment: Rehabilitation  ONSET  DATE: 08/28/22  SUBJECTIVE:                                                                                                                                                                                      SUBJECTIVE STATEMENT: Patient reports he is feeling good today with minimal soreness in shoulder.  PERTINENT HISTORY: Shoulder surgery 08/14/22  PAIN:  Are you having pain? No  PRECAUTIONS: Other: Protocol in file. No resisted extension or IR for 3 months  WEIGHT BEARING RESTRICTIONS: No  FALLS:  Has patient fallen in last 6 months? No  OCCUPATION: Games developer  PLOF: Independent  PATIENT GOALS:improve function of Lt UE  NEXT MD VISIT: 09/26/22  OBJECTIVE:   POSTURE: Rounded shoulders, forward head  UPPER EXTREMITY ROM:   Active ROM Right eval Left eval  Shoulder flexion  143  Shoulder extension    Shoulder abduction  120  Shoulder adduction    Shoulder internal rotation    Shoulder external rotation  35  Elbow flexion    Elbow extension    Wrist flexion    Wrist extension    Wrist ulnar deviation    Wrist radial deviation    Wrist pronation    Wrist supination    (Blank rows = not tested)  UPPER EXTREMITY MMT: Tested against gravity - no resistance on eval MMT Right eval Left eval  Shoulder flexion  3  Shoulder extension    Shoulder abduction  3  Shoulder adduction    Shoulder internal rotation    Shoulder external rotation  3  Middle trapezius    Lower trapezius    Elbow flexion    Elbow extension    Wrist flexion    Wrist extension    Wrist ulnar deviation    Wrist radial deviation    Wrist pronation    Wrist supination    Grip strength (lbs)    (Blank rows = not tested)   JOINT MOBILITY TESTING:  Shoulder jt mobility WFL  PALPATION:  No TTP shoulder joint   TODAY'S TREATMENT:   [facility] Adult PT Treatment:                                                DATE: 10/17/2022 Therapeutic Exercise: Shoulder flexion dynamic  stretch (wall slides) Isometric neutral row step backs GTB x10 Low bicep curls light resistance x10 SA wall slides x15 Bent arm shoulder raises (focus on thoracic stabilization) x10 L arm pendulums L  resisted shoulder abd YTB x12 Wall clock no resistance x10 L S/L shoulder abd + scapulohumeral assist by therapist  S/L side arm raise low - mid + isometric presses  L ER isometric walk outs RTB x12 Modalities: Vaso x 10 min, low pressure, 34 degrees    [facility] Adult PT Treatment:                                                DATE: 10/15/2022 Therapeutic Exercise: S/L shoulder abd + scapulohumeral assist by therapist --> front arm circles Shoulder flexion finger walk up --> arm slide shoulder flexion Self-massage myofascial ball to L pecs Resisted shoulder abd YTB (focus on postural alignment) Isometric neutral row step back RTB x10 Serratus arm wall slides  Bent arm shoulder flexion YTB Wall clock no resistance x 10 Modalities: Vaso x 10 min, low pressure, 34 degrees     [facility] Adult PT Treatment:                                                DATE: 10/09/22 Therapeutic Exercise: Row to neutral red TB x 20 Shoulder ext red TB  to neutral x 20 Shoulder flexion, abduction red TB 2 x 10 Shoulder ER x 10 yellow TB, x 10 no resistance Wall clock no resistance x 10 Serratus wall slide 2 x 10 Supine ER stretch 2 x 30 sec PROM flexion, abd, ER  Modalities: Vaso x 10 min low pressure 34 degrees   PATIENT EDUCATION: Education details: PT POC and goals, HEP Person educated: Patient Education method: Explanation, Demonstration, and Handouts Education comprehension: verbalized understanding and returned demonstration  HOME EXERCISE PROGRAM: Access Code: [email or web address] URL: [email or web address] Date: 09/25/2022 Prepared by: Reggy Eye  Exercises - Standing Single Arm Shoulder Flexion with Posterior Anchored Resistance  - 1 x daily - 7 x weekly - 3 sets - 10 reps -  Standing Single Arm Shoulder Abduction with Resistance  - 1 x daily - 7 x weekly - 3 sets - 10 reps - Shoulder External Rotation Reactive Isometrics  - 1 x daily - 7 x weekly - 3 sets - 10 reps - Standing Shoulder Row with Anchored Resistance  - 1 x daily - 7 x weekly - 3 sets - 10 reps - Standing Shoulder External Rotation Stretch in Doorway  - 1 x daily - 7 x weekly - 1 sets - 10 reps - 10 seconds hold - Standing Single Arm Shoulder Flexion Stretch on Wall  - 1 x daily - 7 x weekly - 1 sets - 10 reps - 10 sec hold  ASSESSMENT:  CLINICAL IMPRESSION:  Isometric strengthening continued within phase 2 protocol parameters. Patient continues to require occasional verbal and tactile cueing for postural alignment and scapula mobility.     OBJECTIVE IMPAIRMENTS: decreased activity tolerance, decreased ROM, decreased strength, increased edema, and impaired UE functional use.    GOALS: Goals reviewed with patient? Yes  SHORT TERM GOALS: Target date: 10/09/2022  Pt will be independent in initial HEP Baseline: Goal status: MET   LONG TERM GOALS: Target date: 11/06/2022  Pt will improve strength to 4+/5 to return to full work out routine Baseline:  Goal status: INITIAL  2.  Pt will be independent in advanced HEP Baseline:  Goal status: INITIAL  3.  Pt will improve Lt shoulder ROM to 160 degrees flexion and abduction, 60 degrees ER to improve mobility for IADLs Baseline:  Goal status: INITIAL   PLAN:  PT FREQUENCY: 1-2x/week  PT DURATION: 6 weeks  PLANNED INTERVENTIONS: Therapeutic exercises, Therapeutic activity, Neuromuscular re-education, Balance training, Gait training, Patient/Family education, Self Care, Joint mobilization, Aquatic Therapy, Dry Needling, Electrical stimulation, Cryotherapy, Moist heat, Taping, Vasopneumatic device, Ultrasound, Ionotophoresis /ml Dexamethasone, Manual therapy, and Re-evaluation  PLAN FOR NEXT SESSION: Progress shoulder strength per protocol,  ROM as tolerated  Carlynn Herald, PTA 04/18/241:59 PM

## 2022-10-22 ENCOUNTER — Ambulatory Visit: Payer: Medicare Other | Admitting: Physical Therapy

## 2022-10-22 ENCOUNTER — Encounter: Payer: Self-pay | Admitting: Physical Therapy

## 2022-10-22 DIAGNOSIS — R600 Localized edema: Secondary | ICD-10-CM

## 2022-10-22 DIAGNOSIS — M6281 Muscle weakness (generalized): Secondary | ICD-10-CM

## 2022-10-22 DIAGNOSIS — G8929 Other chronic pain: Secondary | ICD-10-CM

## 2022-10-22 NOTE — Therapy (Signed)
OUTPATIENT PHYSICAL THERAPY SHOULDER TREATMENT   Patient Name: Raymond Walsh MRN: [id number] DOB:11/26/1955, 67 y.o., male Today's Date: 10/22/2022  END OF SESSION:  PT End of Session - 10/22/22 1220     Visit Number 7    Number of Visits 12    Date for PT Re-Evaluation 11/06/22    Authorization - Visit Number 7    Progress Note Due on Visit 10    PT Start Time 1145    PT Stop Time 1225    PT Time Calculation (min) 40 min    Activity Tolerance Patient tolerated treatment well    Behavior During Therapy [facility] for tasks assessed/performed               Past Medical History:  Diagnosis Date   A-fib    Arthritis    hands,   Cancer    prostate   Dysrhythmia 08/2019   A-fib from energy medication   History of deep vein thrombosis (DVT) of lower extremity    Past Surgical History:  Procedure Laterality Date   ATRIAL FIBRILLATION ABLATION  2023   Dr. 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   Greater trochanteric pain syndrome of left lower extremity 10/20/2019    PCP: Karsten Fells  REFERRING PROVIDER: Ramond Marrow  REFERRING DIAG: s/p Lt reverse total shoulder  THERAPY DIAG:  Muscle weakness (generalized)  Localized edema  Chronic left shoulder pain  Rationale for Evaluation and Treatment: Rehabilitation  ONSET DATE: 08/28/22  SUBJECTIVE:                                                                                                                                                                                       SUBJECTIVE STATEMENT: Patient reports no change in symptoms. States he has been performing HEP  PERTINENT HISTORY: Shoulder surgery 08/14/22  PAIN:  Are you having pain? No  PRECAUTIONS: Other: Protocol in file. No resisted extension or IR for 3 months  WEIGHT BEARING RESTRICTIONS: No  FALLS:  Has patient fallen in last 6 months? No  OCCUPATION: Games developer  PLOF: Independent  PATIENT GOALS:improve function of Lt UE  NEXT MD VISIT: 09/26/22  OBJECTIVE:   POSTURE: Rounded shoulders, forward head  UPPER EXTREMITY ROM:   Active ROM Right eval Left eval  Shoulder flexion  143  Shoulder extension    Shoulder abduction  120  Shoulder adduction    Shoulder internal rotation    Shoulder external rotation  35  Elbow flexion    Elbow extension    Wrist flexion    Wrist extension    Wrist ulnar deviation    Wrist radial deviation    Wrist pronation    Wrist supination    (Blank rows = not tested)  UPPER EXTREMITY MMT: Tested against gravity - no resistance on eval MMT Right eval Left eval  Shoulder flexion  3  Shoulder extension    Shoulder abduction  3  Shoulder adduction    Shoulder internal rotation    Shoulder external rotation  3  Middle trapezius    Lower trapezius    Elbow flexion    Elbow extension    Wrist flexion    Wrist extension    Wrist ulnar deviation    Wrist radial deviation    Wrist pronation    Wrist supination    Grip strength (lbs)    (Blank rows = not tested)   JOINT MOBILITY TESTING:  Shoulder jt mobility WFL  PALPATION:  No TTP shoulder joint   TODAY'S TREATMENT:   [facility] Adult PT Treatment:                                                DATE: 10/22/22 Therapeutic Exercise: Ball roll up wall for dynamic flexion stretch x 2 min Isometric row green TB x 15 Reactive isometrics red TB ER x 15 Shoulder abduction yellow TB  x 20 Serratus wall slide x 15 Wall clock no resistance x 10 Sidelying shoulder abd with scapulohumeral assist by PT 2 x 10 Sidelying ER to neutral 2 x 10 Prone shld ext to neutral x 15 Prone "T" x 15  Modalities: Vaso x 10 min low pressure 34 degrees    [facility] Adult PT Treatment:                                                DATE: 10/17/2022 Therapeutic Exercise: Shoulder flexion dynamic stretch (wall slides) Isometric neutral row step backs GTB x10 Low bicep curls light resistance x10 SA wall slides x15 Bent arm shoulder raises (focus on thoracic stabilization) x10 L arm pendulums L  resisted shoulder abd YTB x12 Wall clock no resistance x10 L S/L shoulder abd + scapulohumeral assist by therapist  S/L side arm raise low - mid + isometric presses  L ER isometric walk outs RTB x12 Modalities: Vaso x 10 min, low pressure, 34 degrees    [facility] Adult PT Treatment:                                                DATE: 10/15/2022 Therapeutic Exercise: S/L shoulder abd + scapulohumeral assist by therapist --> front arm circles Shoulder flexion finger walk  up --> arm slide shoulder flexion Self-massage myofascial ball to L pecs Resisted shoulder abd YTB (focus on postural alignment) Isometric neutral row step back RTB x10 Serratus arm wall slides  Bent arm shoulder flexion YTB Wall clock no resistance x 10 Modalities: Vaso x 10 min, low pressure, 34 degrees     PATIENT EDUCATION: Education details: PT POC and goals, HEP Person educated: Patient Education method: Explanation, Demonstration, and Handouts Education comprehension: verbalized understanding and returned demonstration  HOME EXERCISE PROGRAM: Access Code: [email or web address] URL: [email or web address] Date: 09/25/2022 Prepared by: Reggy Eye  Exercises - Standing Single Arm Shoulder Flexion with Posterior Anchored Resistance  - 1 x daily - 7 x weekly - 3 sets - 10 reps - Standing Single Arm Shoulder  Abduction with Resistance  - 1 x daily - 7 x weekly - 3 sets - 10 reps - Shoulder External Rotation Reactive Isometrics  - 1 x daily - 7 x weekly - 3 sets - 10 reps - Standing Shoulder Row with Anchored Resistance  - 1 x daily - 7 x weekly - 3 sets - 10 reps - Standing Shoulder External Rotation Stretch in Doorway  - 1 x daily - 7 x weekly - 1 sets - 10 reps - 10 seconds hold - Standing Single Arm Shoulder Flexion Stretch on Wall  - 1 x daily - 7 x weekly - 1 sets - 10 reps - 10 sec hold  ASSESSMENT:  CLINICAL IMPRESSION:  Pt with good tolerance to addition of prone exercises, continues to benefit from scapulohumeral assist during shoulder abduction. Progressing well   OBJECTIVE IMPAIRMENTS: decreased activity tolerance, decreased ROM, decreased strength, increased edema, and impaired UE functional use.    GOALS: Goals reviewed with patient? Yes  SHORT TERM GOALS: Target date: 10/09/2022  Pt will be independent in initial HEP Baseline: Goal status: MET   LONG TERM GOALS: Target date: 11/06/2022  Pt will improve strength to 4+/5 to return to full work out routine Baseline:  Goal status: INITIAL  2.  Pt will be independent in advanced HEP Baseline:  Goal status: INITIAL  3.  Pt will improve Lt shoulder ROM to 160 degrees flexion and abduction, 60 degrees ER to improve mobility for IADLs Baseline:  Goal status: INITIAL   PLAN:  PT FREQUENCY: 1-2x/week  PT DURATION: 6 weeks  PLANNED INTERVENTIONS: Therapeutic exercises, Therapeutic activity, Neuromuscular re-education, Balance training, Gait training, Patient/Family education, Self Care, Joint mobilization, Aquatic Therapy, Dry Needling, Electrical stimulation, Cryotherapy, Moist heat, Taping, Vasopneumatic device, Ultrasound, Ionotophoresis /ml Dexamethasone, Manual therapy, and Re-evaluation  PLAN FOR NEXT SESSION: Progress shoulder strength per protocol, ROM as tolerated  Reggy Eye, PT,DPT04/23/2412:21  PM  10/22/2410:21 PM

## 2022-10-24 ENCOUNTER — Ambulatory Visit: Payer: Medicare Other

## 2022-10-24 DIAGNOSIS — M25561 Pain in right knee: Secondary | ICD-10-CM

## 2022-10-24 DIAGNOSIS — R29898 Other symptoms and signs involving the musculoskeletal system: Secondary | ICD-10-CM

## 2022-10-24 DIAGNOSIS — M6281 Muscle weakness (generalized): Secondary | ICD-10-CM | POA: Diagnosis not present

## 2022-10-24 DIAGNOSIS — R600 Localized edema: Secondary | ICD-10-CM

## 2022-10-24 DIAGNOSIS — R293 Abnormal posture: Secondary | ICD-10-CM

## 2022-10-24 DIAGNOSIS — G8929 Other chronic pain: Secondary | ICD-10-CM

## 2022-10-24 NOTE — Therapy (Signed)
OUTPATIENT PHYSICAL THERAPY SHOULDER TREATMENT   Patient Name: Raymond Walsh MRN: [id number] DOB:March 07, 1956, 67 y.o., male Today's Date: 10/24/2022  END OF SESSION:  PT End of Session - 10/24/22 1149     Visit Number 8    Number of Visits 12    Date for PT Re-Evaluation 11/06/22    Authorization Type medicare    Authorization - Visit Number 8    Progress Note Due on Visit 10    PT Start Time 1148    PT Stop Time 1236    PT Time Calculation (min) 48 min    Activity Tolerance Patient tolerated treatment well    Behavior During Therapy [facility] for tasks assessed/performed               Past Medical History:  Diagnosis Date   A-fib    Arthritis    hands,   Cancer    prostate   Dysrhythmia 08/2019   A-fib from energy medication   History of deep vein thrombosis (DVT) of lower extremity    Past Surgical History:  Procedure Laterality Date   ATRIAL FIBRILLATION ABLATION  2023   Dr. 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   Greater trochanteric pain syndrome of left lower extremity 10/20/2019    PCP: Karsten Fells  REFERRING PROVIDER: Ramond Marrow  REFERRING DIAG: s/p Lt reverse total shoulder  THERAPY DIAG:  Muscle weakness (generalized)  Localized edema  Chronic left shoulder pain  Other symptoms and signs involving the musculoskeletal system  Abnormal posture  Acute pain of right knee  Rationale for Evaluation and  Treatment: Rehabilitation  ONSET DATE: 08/28/22  SUBJECTIVE:                                                                                                                                                                                      SUBJECTIVE STATEMENT: Patient reports he feels an improvement in L shoulder strength and mobility.   PERTINENT HISTORY: Shoulder surgery 08/14/22  PAIN:  Are you having pain? No  PRECAUTIONS: Other: Protocol in file. No resisted extension or IR for 3  months  WEIGHT BEARING RESTRICTIONS: No  FALLS:  Has patient fallen in last 6 months? No  OCCUPATION: Games developer  PLOF: Independent  PATIENT GOALS:improve function of Lt UE  NEXT MD VISIT: 09/26/22  OBJECTIVE:   POSTURE: Rounded shoulders, forward head  UPPER EXTREMITY ROM:   Active ROM Right eval Left eval  Shoulder flexion  143  Shoulder extension    Shoulder abduction  120  Shoulder adduction    Shoulder internal rotation    Shoulder external rotation  35  Elbow flexion    Elbow extension    Wrist flexion    Wrist extension    Wrist ulnar deviation    Wrist radial deviation    Wrist pronation    Wrist supination    (Blank rows = not tested)  UPPER EXTREMITY MMT: Tested against gravity - no resistance on eval MMT Right eval Left eval  Shoulder flexion  3  Shoulder extension    Shoulder abduction  3  Shoulder adduction    Shoulder internal rotation    Shoulder external rotation  3  Middle trapezius    Lower trapezius    Elbow flexion    Elbow extension    Wrist flexion    Wrist extension    Wrist ulnar deviation    Wrist radial deviation    Wrist pronation    Wrist supination    Grip strength (lbs)    (Blank rows = not tested)   JOINT MOBILITY TESTING:  Shoulder jt mobility WFL  PALPATION:  No TTP shoulder joint   TODAY'S TREATMENT:    [facility] Adult PT Treatment:                                                DATE:  10/24/2022 Therapeutic Exercise: L shoulder stretch: rolling coregeous ball up/down wall Scap circles --> scap clock --> scap elevate/depress --> scap retract/protract Wall clock (L) - no resistance  SA arm wall slide - no resistance Bent arm shoulder flexion with SA activation (barbie arm raises w/back at wall) Isometric row step out GTB x 15 Seated thoracic extension w/coregeous ball + dowel shoulder flexion Standing core marching against wall Prone shld ext to neutral x 15 Prone "T" x 15 Modalities: Vaso x 10 min low pressure 34 degrees    [facility] Adult PT Treatment:                                                DATE: 10/22/22 Therapeutic Exercise: Ball roll up wall for dynamic flexion stretch x 2 min Isometric row green TB x 15 Reactive isometrics red TB ER x 15 Shoulder abduction yellow TB x 20 Serratus wall slide x 15 Wall clock no resistance x 10 Sidelying shoulder abd with scapulohumeral assist by PT 2 x 10 Sidelying ER to neutral 2 x 10 Prone shld ext to neutral x 15 Prone "T" x 15  Modalities: Vaso x 10 min low pressure 34 degrees   PATIENT EDUCATION: Education details: using coregeous ball for thoracic extension stretch (posture) Person educated: Patient Education method: Explanation, Demonstration, and Handouts Education comprehension: verbalized understanding and returned demonstration  HOME EXERCISE PROGRAM: Access Code: [email or web address] URL: [email or web address] Date: 09/25/2022 Prepared by: Reggy Eye  Exercises - Standing Single Arm Shoulder Flexion with Posterior Anchored Resistance  - 1 x daily - 7 x weekly - 3 sets - 10 reps - Standing Single Arm Shoulder Abduction with Resistance  - 1 x daily - 7 x weekly - 3 sets - 10 reps - Shoulder External Rotation Reactive Isometrics  - 1 x daily - 7 x weekly - 3 sets - 10 reps - Standing Shoulder Row with Anchored Resistance  - 1 x daily - 7 x weekly - 3 sets - 10 reps - Standing Shoulder External  Rotation Stretch in Doorway  - 1 x daily - 7 x weekly - 1 sets - 10 reps - 10 seconds hold - Standing Single Arm Shoulder Flexion Stretch on Wall  - 1 x daily - 7 x weekly - 1 sets - 10 reps - 10 sec hold  ASSESSMENT:  CLINICAL IMPRESSION:  Tactile cues provided to improve serratus activation with wall arm wall slides. Patient demonstrated moderate difficulty with scapula mobility, most significantly with depression. Standing core exercise incorporated to progress postural strengthening and support.   OBJECTIVE IMPAIRMENTS: decreased activity tolerance, decreased ROM, decreased strength, increased edema, and impaired UE functional use.    GOALS: Goals reviewed with patient? Yes  SHORT TERM GOALS: Target date: 10/09/2022  Pt will be independent in initial HEP Baseline: Goal status: MET   LONG TERM GOALS: Target date: 11/06/2022  Pt will improve strength to 4+/5 to return to full work out routine Baseline:  Goal status: INITIAL  2.  Pt will be independent in advanced HEP Baseline:  Goal status: INITIAL  3.  Pt will improve Lt shoulder ROM to 160 degrees flexion and abduction, 60 degrees ER to improve mobility for IADLs Baseline:  Goal status: INITIAL   PLAN:  PT FREQUENCY: 1-2x/week  PT DURATION: 6 weeks  PLANNED INTERVENTIONS: Therapeutic exercises, Therapeutic activity, Neuromuscular re-education, Balance training, Gait training, Patient/Family education, Self Care, Joint mobilization, Aquatic Therapy, Dry Needling, Electrical stimulation, Cryotherapy, Moist heat, Taping, Vasopneumatic device, Ultrasound, Ionotophoresis /ml Dexamethasone, Manual therapy, and Re-evaluation  PLAN FOR NEXT SESSION: Progress shoulder strength per protocol, core strengthening, ROM as tolerated   Carlynn Herald, PTA 10/24/2022 12:31 PM

## 2022-10-29 ENCOUNTER — Encounter: Payer: Self-pay | Admitting: Physical Therapy

## 2022-10-29 ENCOUNTER — Ambulatory Visit: Payer: Medicare Other | Admitting: Physical Therapy

## 2022-10-29 DIAGNOSIS — R600 Localized edema: Secondary | ICD-10-CM

## 2022-10-29 DIAGNOSIS — M6281 Muscle weakness (generalized): Secondary | ICD-10-CM

## 2022-10-29 DIAGNOSIS — G8929 Other chronic pain: Secondary | ICD-10-CM

## 2022-10-29 NOTE — Therapy (Signed)
OUTPATIENT PHYSICAL THERAPY SHOULDER TREATMENT   Patient Name: Raymond Walsh MRN: [id number] DOB:January 13, 1956, 67 y.o., male Today's Date: 10/29/2022  END OF SESSION:  PT End of Session - 10/29/22 1217     Visit Number 9    Number of Visits 12    Date for PT Re-Evaluation 11/06/22    Authorization - Visit Number 9    Progress Note Due on Visit 10    PT Start Time 1145    PT Stop Time 1227    PT Time Calculation (min) 42 min    Activity Tolerance Patient tolerated treatment well    Behavior During Therapy [facility] for tasks assessed/performed                Past Medical History:  Diagnosis Date   A-fib (HCC)    Arthritis    hands,   Cancer (HCC)    prostate   Dysrhythmia 08/2019   A-fib from energy medication   History of deep vein thrombosis (DVT) of lower extremity    Past Surgical History:  Procedure Laterality Date   ATRIAL FIBRILLATION ABLATION  2023   Dr. Lanell Persons SURGERY  2000   plate in O9--G2   KNEE ARTHROPLASTY Right 2023   TOTAL SHOULDER ARTHROPLASTY Left 08/14/2021   Procedure: LEFT TOTAL SHOULDER ARTHROPLASTY;  Surgeon: Ernestina Columbia, MD;  Location: [facility];  Service: Orthopedics;  Laterality: Left;   TOTAL SHOULDER REVISION Left 08/28/2022   Procedure: TOTAL SHOULDER REVISION;  Surgeon: Bjorn Pippin, MD;  Location: [facility];  Service: Orthopedics;  Laterality: Left;   Patient Active Problem List   Diagnosis Date Noted   Prosthetic joint infection (HCC) 09/03/2022   H/O total shoulder replacement, left 08/28/2022   Greater trochanteric pain syndrome of left lower extremity 10/20/2019    PCP: Karsten Fells  REFERRING PROVIDER: Ramond Marrow  REFERRING DIAG: s/p Lt reverse total shoulder  THERAPY DIAG:  Muscle weakness (generalized)  Localized edema  Chronic left shoulder pain  Rationale for Evaluation and Treatment: Rehabilitation  ONSET DATE: 08/28/22  SUBJECTIVE:                                                                                                                                                                                       SUBJECTIVE STATEMENT: Patient reports no changes since last visit. He has been working triceps and biceps at home   PERTINENT HISTORY: Shoulder surgery 08/14/22  PAIN:  Are you having pain? No  PRECAUTIONS: Other: Protocol in file. No resisted extension or IR for 3 months  WEIGHT BEARING RESTRICTIONS: No  FALLS:  Has patient fallen in last 6 months?  No  OCCUPATION: Games developer  PLOF: Independent  PATIENT GOALS:improve function of Lt UE  NEXT MD VISIT: 09/26/22  OBJECTIVE:   POSTURE: Rounded shoulders, forward head  UPPER EXTREMITY ROM:   Active ROM Right eval Left eval  Shoulder flexion  143  Shoulder extension    Shoulder abduction  120  Shoulder adduction    Shoulder internal rotation    Shoulder external rotation  35  Elbow flexion    Elbow extension    Wrist flexion    Wrist extension    Wrist ulnar deviation    Wrist radial deviation    Wrist pronation    Wrist supination    (Blank rows = not tested)  UPPER EXTREMITY MMT: Tested against gravity - no resistance on eval MMT Right eval Left eval  Shoulder flexion  3  Shoulder extension    Shoulder abduction  3  Shoulder adduction    Shoulder internal rotation    Shoulder external rotation  3  Middle trapezius    Lower trapezius    Elbow flexion    Elbow extension    Wrist flexion    Wrist extension    Wrist ulnar deviation    Wrist radial deviation    Wrist pronation    Wrist supination    Grip strength (lbs)    (Blank rows = not tested)   JOINT MOBILITY TESTING:  Shoulder jt mobility WFL  PALPATION:  No TTP shoulder joint   TODAY'S TREATMENT:   [facility] Adult PT Treatment:                                                DATE: 10/29/22 Therapeutic Exercise: Ball on wall CW/CCW 2 x 30 sec Serratus wall slide x 15 Bent arm shoulder flexion 1# x 20 90/90 hold 1# x  2 laps 1# overhead hold x 1 lap Shoulder flexion 1# x 20 Scaption 1# x 15 Tricep kick back 4# x 20 Prone row 4# to neutral x 20 Sidelying ER to neutral x 20 Reactive isometric row blue TB x 20 Shoulder abd x 20 yellow TB Shoulder diagonals within protocol limits x 10 yellow TB  Modalities: Vaso x 10 minutes low pressure 34 degrees   [facility] Adult PT Treatment:                                                DATE: 10/24/2022 Therapeutic Exercise: L shoulder stretch: rolling coregeous ball up/down wall Scap circles --> scap clock --> scap elevate/depress --> scap retract/protract Wall clock (L) - no resistance  SA arm wall slide - no resistance Bent arm shoulder flexion with SA activation (barbie arm raises w/back at wall) Isometric row step out GTB x 15 Seated thoracic extension w/coregeous ball + dowel shoulder flexion Standing core marching against wall Prone shld ext to neutral x 15 Prone "T" x 15 Modalities: Vaso x 10 min low pressure 34 degrees    [facility] Adult PT Treatment:  DATE: 10/22/22 Therapeutic Exercise: Ball roll up wall for dynamic flexion stretch x 2 min Isometric row green TB x 15 Reactive isometrics red TB ER x 15 Shoulder abduction yellow TB x 20 Serratus wall slide x 15 Wall clock no resistance x 10 Sidelying shoulder abd with scapulohumeral assist by PT 2 x 10 Sidelying ER to neutral 2 x 10 Prone shld ext to neutral x 15 Prone "T" x 15  Modalities: Vaso x 10 min low pressure 34 degrees   PATIENT EDUCATION: Education details: using coregeous ball for thoracic extension stretch (posture) Person educated: Patient Education method: Explanation, Demonstration, and Handouts Education comprehension: verbalized understanding and returned demonstration  HOME EXERCISE PROGRAM: Access Code: [email or web address] URL: [email or web address] Date: 09/25/2022 Prepared by: Reggy Eye  Exercises - Standing  Single Arm Shoulder Flexion with Posterior Anchored Resistance  - 1 x daily - 7 x weekly - 3 sets - 10 reps - Standing Single Arm Shoulder Abduction with Resistance  - 1 x daily - 7 x weekly - 3 sets - 10 reps - Shoulder External Rotation Reactive Isometrics  - 1 x daily - 7 x weekly - 3 sets - 10 reps - Standing Shoulder Row with Anchored Resistance  - 1 x daily - 7 x weekly - 3 sets - 10 reps - Standing Shoulder External Rotation Stretch in Doorway  - 1 x daily - 7 x weekly - 1 sets - 10 reps - 10 seconds hold - Standing Single Arm Shoulder Flexion Stretch on Wall  - 1 x daily - 7 x weekly - 1 sets - 10 reps - 10 sec hold  ASSESSMENT:  CLINICAL IMPRESSION:  Pt fatigues quickly with holds. Encouraged pt to add holds without weight to his regular workout routine at home   OBJECTIVE IMPAIRMENTS: decreased activity tolerance, decreased ROM, decreased strength, increased edema, and impaired UE functional use.    GOALS: Goals reviewed with patient? Yes  SHORT TERM GOALS: Target date: 10/09/2022  Pt will be independent in initial HEP Baseline: Goal status: MET   LONG TERM GOALS: Target date: 11/06/2022  Pt will improve strength to 4+/5 to return to full work out routine Baseline:  Goal status: INITIAL  2.  Pt will be independent in advanced HEP Baseline:  Goal status: INITIAL  3.  Pt will improve Lt shoulder ROM to 160 degrees flexion and abduction, 60 degrees ER to improve mobility for IADLs Baseline:  Goal status: INITIAL   PLAN:  PT FREQUENCY: 1-2x/week  PT DURATION: 6 weeks  PLANNED INTERVENTIONS: Therapeutic exercises, Therapeutic activity, Neuromuscular re-education, Balance training, Gait training, Patient/Family education, Self Care, Joint mobilization, Aquatic Therapy, Dry Needling, Electrical stimulation, Cryotherapy, Moist heat, Taping, Vasopneumatic device, Ultrasound, Ionotophoresis 4mg /ml Dexamethasone, Manual therapy, and Re-evaluation  PLAN FOR NEXT SESSION:  MEDICARE 10th VISIT NOTE! Progress shoulder strength per protocol, core strengthening, ROM as tolerated   Reggy Eye, PT,DPT04/30/2412:18 PM  10/29/2022 12:18 PM

## 2022-10-31 ENCOUNTER — Ambulatory Visit: Payer: Medicare Other | Attending: Orthopaedic Surgery

## 2022-10-31 DIAGNOSIS — R600 Localized edema: Secondary | ICD-10-CM | POA: Diagnosis present

## 2022-10-31 DIAGNOSIS — M6281 Muscle weakness (generalized): Secondary | ICD-10-CM | POA: Diagnosis present

## 2022-10-31 DIAGNOSIS — R293 Abnormal posture: Secondary | ICD-10-CM | POA: Diagnosis present

## 2022-10-31 DIAGNOSIS — G8929 Other chronic pain: Secondary | ICD-10-CM | POA: Diagnosis present

## 2022-10-31 DIAGNOSIS — M25512 Pain in left shoulder: Secondary | ICD-10-CM | POA: Insufficient documentation

## 2022-10-31 DIAGNOSIS — R29898 Other symptoms and signs involving the musculoskeletal system: Secondary | ICD-10-CM

## 2022-10-31 NOTE — Therapy (Addendum)
OUTPATIENT PHYSICAL THERAPY SHOULDER TREATMENT AND 10th VISIT NOTE   Patient Name: Raymond Walsh MRN: [id number] DOB:19-Oct-1955, 67 y.o., male Today's Date: 10/31/2022 DATES OF SERVICE 09/25/22 - 10/31/22 END OF SESSION:  PT End of Session - 10/31/22 1148     Visit Number 10    Number of Visits 12    Date for PT Re-Evaluation 11/06/22    Authorization Type medicare    Authorization - Visit Number 10    Progress Note Due on Visit 10    PT Start Time 1148    PT Stop Time 1242    PT Time Calculation (min) 54 min    Activity Tolerance Patient tolerated treatment well    Behavior During Therapy WFL for tasks assessed/performed                Past Medical History:  Diagnosis Date   A-fib (HCC)    Arthritis    hands,   Cancer (HCC)    prostate   Dysrhythmia 08/2019   A-fib from energy medication   History of deep vein thrombosis (DVT) of lower extremity    Past Surgical History:  Procedure Laterality Date   ATRIAL FIBRILLATION ABLATION  2023   Dr. Lanell Persons SURGERY  2000   plate in W0--J8   KNEE ARTHROPLASTY Right 2023   TOTAL SHOULDER ARTHROPLASTY Left 08/14/2021   Procedure: LEFT TOTAL SHOULDER ARTHROPLASTY;  Surgeon: Ernestina Columbia, MD;  Location: [facility];  Service: Orthopedics;  Laterality: Left;   TOTAL SHOULDER REVISION Left 08/28/2022   Procedure: TOTAL SHOULDER REVISION;  Surgeon: Bjorn Pippin, MD;  Location: [facility];  Service: Orthopedics;  Laterality: Left;   Patient Active Problem List   Diagnosis Date Noted   Prosthetic joint infection (HCC) 09/03/2022   H/O total shoulder replacement, left 08/28/2022   Greater trochanteric pain syndrome of left lower extremity 10/20/2019    PCP: Karsten Fells  REFERRING PROVIDER: Ramond Marrow  REFERRING DIAG: s/p Lt reverse total shoulder  THERAPY DIAG:  Muscle weakness (generalized)  Localized edema  Chronic left shoulder pain  Other symptoms and signs involving the musculoskeletal system  Abnormal  posture  Rationale for Evaluation and Treatment: Rehabilitation  ONSET DATE: 08/28/22  SUBJECTIVE:                                                                                                                                                                                      SUBJECTIVE STATEMENT: Patient reports no changes since last visit. He has been working triceps and biceps at home   PERTINENT HISTORY: Shoulder surgery 08/14/22  PAIN:  Are you having pain? No  PRECAUTIONS:  Other: Protocol in file. No resisted extension or IR for 3 months  WEIGHT BEARING RESTRICTIONS: No  FALLS:  Has patient fallen in last 6 months? No  OCCUPATION: Games developer  PLOF: Independent  PATIENT GOALS:improve function of Lt UE  NEXT MD VISIT: 09/26/22  OBJECTIVE:   POSTURE: Rounded shoulders, forward head  UPPER EXTREMITY ROM:   Active ROM Right eval Left eval Left 10/31/22  Shoulder flexion  143 155  Shoulder extension     Shoulder abduction  120 140  Shoulder adduction     Shoulder internal rotation     Shoulder external rotation  35 45  Elbow flexion     Elbow extension     Wrist flexion     Wrist extension     Wrist ulnar deviation     Wrist radial deviation     Wrist pronation     Wrist supination     (Blank rows = not tested)  UPPER EXTREMITY MMT: Tested against gravity - no resistance on eval MMT Right eval Left eval Left 10/31/22  Shoulder flexion  3 4+  Shoulder extension     Shoulder abduction  3 4+  Shoulder adduction     Shoulder internal rotation     Shoulder external rotation  3 4  Middle trapezius     Lower trapezius     Elbow flexion     Elbow extension     Wrist flexion     Wrist extension     Wrist ulnar deviation     Wrist radial deviation     Wrist pronation     Wrist supination     Grip strength (lbs)     (Blank rows = not tested)   JOINT MOBILITY TESTING:  Shoulder jt mobility WFL  PALPATION:  No TTP shoulder  joint   TODAY'S TREATMENT:   [facility] Adult PT Treatment:                                                DATE: 10/31/2022 Therapeutic Exercise: Shoulder MMT & measurements (see above) Isometric shoulder ER step out GTB x10, 10x5" Bent arm shoulder flexion SA activation - YTB x10, stretch strap x10 Wall ABCs (small ball, small range) 90 degree shoulder flexion + 1#MB quick toss/catch 2x30" --> 2#MB x30" Modalities: Vaso x 10 minutes low pressure 34 degrees    [facility] Adult PT Treatment:                                                DATE: 10/29/22 Therapeutic Exercise: Ball on wall CW/CCW 2 x 30 sec Serratus wall slide x 15 Bent arm shoulder flexion 1# x 20 90/90 hold 1# x 2 laps 1# overhead hold x 1 lap Shoulder flexion 1# x 20 Scaption 1# x 15 Tricep kick back 4# x 20 Prone row 4# to neutral x 20 Sidelying ER to neutral x 20 Reactive isometric row blue TB x 20 Shoulder abd x 20 yellow TB Shoulder diagonals within protocol limits x 10 yellow TB  Modalities: Vaso x 10 minutes low pressure 34 degrees   [facility] Adult PT Treatment:  DATE: 10/24/2022 Therapeutic Exercise: L shoulder stretch: rolling coregeous ball up/down wall Scap circles --> scap clock --> scap elevate/depress --> scap retract/protract Wall clock (L) - no resistance  SA arm wall slide - no resistance Bent arm shoulder flexion with SA activation (barbie arm raises w/back at wall) Isometric row step out GTB x 15 Seated thoracic extension w/coregeous ball + dowel shoulder flexion Standing core marching against wall Prone shld ext to neutral x 15 Prone "T" x 15 Modalities: Vaso x 10 min low pressure 34 degrees   PATIENT EDUCATION: Education details: using coregeous ball for thoracic extension stretch (posture) Person educated: Patient Education method: Explanation, Demonstration, and Handouts Education comprehension: verbalized understanding and returned  demonstration  HOME EXERCISE PROGRAM: Access Code: [email or web address] URL: [email or web address] Date: 09/25/2022 Prepared by: Reggy Eye  Exercises - Standing Single Arm Shoulder Flexion with Posterior Anchored Resistance  - 1 x daily - 7 x weekly - 3 sets - 10 reps - Standing Single Arm Shoulder Abduction with Resistance  - 1 x daily - 7 x weekly - 3 sets - 10 reps - Shoulder External Rotation Reactive Isometrics  - 1 x daily - 7 x weekly - 3 sets - 10 reps - Standing Shoulder Row with Anchored Resistance  - 1 x daily - 7 x weekly - 3 sets - 10 reps - Standing Shoulder External Rotation Stretch in Doorway  - 1 x daily - 7 x weekly - 1 sets - 10 reps - 10 seconds hold - Standing Single Arm Shoulder Flexion Stretch on Wall  - 1 x daily - 7 x weekly - 1 sets - 10 reps - 10 sec hold  ASSESSMENT:  CLINICAL IMPRESSION:  Good progression demonstrated with L shoulder ROM and strength, most significantly with shoulder flexion and abduction. Moderate fatigue exhibited by second set of shoulder ER. Isometric resistance added during bent arm shoulder flexion improved serratus activation and proprioceptive awareness of postural alignment. Patient will continue to benefit from skilled therapy to progress shoulder stability and strength within protocol guidelines.    OBJECTIVE IMPAIRMENTS: decreased activity tolerance, decreased ROM, decreased strength, increased edema, and impaired UE functional use.    GOALS: Goals reviewed with patient? Yes  SHORT TERM GOALS: Target date: 10/09/2022  Pt will be independent in initial HEP Baseline: Goal status: MET   LONG TERM GOALS: Target date: 11/06/2022  Pt will improve strength to 4+/5 to return to full work out routine Baseline:  Goal status: INITIAL  2.  Pt will be independent in advanced HEP Baseline:  Goal status: MET  3.  Pt will improve Lt shoulder ROM to 160 degrees flexion and abduction, 60 degrees ER to improve mobility for  IADLs Baseline:  Goal status: INITIAL   PLAN:  PT FREQUENCY: 1-2x/week  PT DURATION: 6 weeks  PLANNED INTERVENTIONS: Therapeutic exercises, Therapeutic activity, Neuromuscular re-education, Balance training, Gait training, Patient/Family education, Self Care, Joint mobilization, Aquatic Therapy, Dry Needling, Electrical stimulation, Cryotherapy, Moist heat, Taping, Vasopneumatic device, Ultrasound, Ionotophoresis 4mg /ml Dexamethasone, Manual therapy, and Re-evaluation  PLAN FOR NEXT SESSION: Progress shoulder strength per protocol, core strengthening, ROM as tolerated  Reggy Eye, PT,DPT05/03/248:24 AM  Carlynn Herald, PTA 10/31/2022 12:39 PM

## 2022-11-05 ENCOUNTER — Ambulatory Visit: Payer: Medicare Other | Admitting: Physical Therapy

## 2022-11-05 ENCOUNTER — Encounter: Payer: Self-pay | Admitting: Physical Therapy

## 2022-11-05 DIAGNOSIS — M6281 Muscle weakness (generalized): Secondary | ICD-10-CM

## 2022-11-05 DIAGNOSIS — R600 Localized edema: Secondary | ICD-10-CM

## 2022-11-05 DIAGNOSIS — G8929 Other chronic pain: Secondary | ICD-10-CM

## 2022-11-05 NOTE — Therapy (Signed)
OUTPATIENT PHYSICAL THERAPY SHOULDER TREATMENT AND RECERTIFICATION   Patient Name: Raymond Walsh MRN: [id number] DOB:May 01, 1956, 67 y.o., male Today's Date: 11/05/2022  END OF SESSION:  PT End of Session - 11/05/22 1215     Visit Number 11    Number of Visits 23    Date for PT Re-Evaluation 12/17/22    Authorization Type medicare    Authorization - Visit Number 11    Progress Note Due on Visit 20    PT Start Time 1145    PT Stop Time 1225    PT Time Calculation (min) 40 min    Activity Tolerance Patient tolerated treatment well    Behavior During Therapy WFL for tasks assessed/performed                 Past Medical History:  Diagnosis Date   A-fib (HCC)    Arthritis    hands,   Cancer (HCC)    prostate   Dysrhythmia 08/2019   A-fib from energy medication   History of deep vein thrombosis (DVT) of lower extremity    Past Surgical History:  Procedure Laterality Date   ATRIAL FIBRILLATION ABLATION  2023   Dr. Lanell Persons SURGERY  2000   plate in W0--J8   KNEE ARTHROPLASTY Right 2023   TOTAL SHOULDER ARTHROPLASTY Left 08/14/2021   Procedure: LEFT TOTAL SHOULDER ARTHROPLASTY;  Surgeon: Ernestina Columbia, MD;  Location: [facility];  Service: Orthopedics;  Laterality: Left;   TOTAL SHOULDER REVISION Left 08/28/2022   Procedure: TOTAL SHOULDER REVISION;  Surgeon: Bjorn Pippin, MD;  Location: [facility];  Service: Orthopedics;  Laterality: Left;   Patient Active Problem List   Diagnosis Date Noted   Prosthetic joint infection (HCC) 09/03/2022   H/O total shoulder replacement, left 08/28/2022   Greater trochanteric pain syndrome of left lower extremity 10/20/2019    PCP: Karsten Fells  REFERRING PROVIDER: Ramond Marrow  REFERRING DIAG: s/p Lt reverse total shoulder  THERAPY DIAG:  Muscle weakness (generalized)  Localized edema  Chronic left shoulder pain  Rationale for Evaluation and Treatment: Rehabilitation  ONSET DATE: 08/28/22  SUBJECTIVE:                                                                                                                                                                                       SUBJECTIVE STATEMENT: Patient reports no changes since last visit. He states he is going to work (light duty) next week and is glad to be able to get out of the house  PERTINENT HISTORY: Shoulder surgery 08/14/22  PAIN:  Are you having pain? No  PRECAUTIONS: Other: Protocol in file. No  resisted extension or IR for 3 months  WEIGHT BEARING RESTRICTIONS: No  FALLS:  Has patient fallen in last 6 months? No  OCCUPATION: Games developer  PLOF: Independent  PATIENT GOALS:improve function of Lt UE  NEXT MD VISIT: 09/26/22  OBJECTIVE:   POSTURE: Rounded shoulders, forward head  UPPER EXTREMITY ROM:   Active ROM Right eval Left eval Left 10/31/22  Shoulder flexion  143 155  Shoulder extension     Shoulder abduction  120 140  Shoulder adduction     Shoulder internal rotation     Shoulder external rotation  35 45  Elbow flexion     Elbow extension     Wrist flexion     Wrist extension     Wrist ulnar deviation     Wrist radial deviation     Wrist pronation     Wrist supination     (Blank rows = not tested)  UPPER EXTREMITY MMT: Tested against gravity - no resistance on eval MMT Right eval Left eval Left 10/31/22  Shoulder flexion  3 4+  Shoulder extension     Shoulder abduction  3 4+  Shoulder adduction     Shoulder internal rotation     Shoulder external rotation  3 4  Middle trapezius     Lower trapezius     Elbow flexion     Elbow extension     Wrist flexion     Wrist extension     Wrist ulnar deviation     Wrist radial deviation     Wrist pronation     Wrist supination     Grip strength (lbs)     (Blank rows = not tested)   JOINT MOBILITY TESTING:  Shoulder jt mobility WFL  PALPATION:  No TTP shoulder joint   TODAY'S TREATMENT:   [facility] Adult PT Treatment:                                                 DATE: 11/05/22 Therapeutic Exercise: Shoulder alphabet capital and lower case to increase endurance in small range Flexed elbow shoulder flexion with yellow TB to activate serratus 2 x 10 Overhead hold 1# x 1 lap Abduction to 90 hold 1# x 1 lap Scaption 1# 2 x 10 Abd 1# 2 x 10 with elbow bent Isometric ER step out green TB 2 x 10 Shoulder extension to neutral green TB x 10 Shoulder diagonals within protocol limits x 10 green TB Quick toss/catch 2kg med ball 2 x 30 sec  Modalities: Vaso x 10 minutes low pressure 34 degrees    [facility] Adult PT Treatment:                                                DATE: 10/31/2022 Therapeutic Exercise: Shoulder MMT & measurements (see above) Isometric shoulder ER step out GTB x10, 10x5" Bent arm shoulder flexion SA activation - YTB x10, stretch strap x10 Wall ABCs (small ball, small range) 90 degree shoulder flexion + 1#MB quick toss/catch 2x30" --> 2#MB x30" Modalities: Vaso x 10 minutes low pressure 34 degrees    [facility] Adult PT Treatment:  DATE: 10/29/22 Therapeutic Exercise: Ball on wall CW/CCW 2 x 30 sec Serratus wall slide x 15 Bent arm shoulder flexion 1# x 20 90/90 hold 1# x 2 laps 1# overhead hold x 1 lap Shoulder flexion 1# x 20 Scaption 1# x 15 Tricep kick back 4# x 20 Prone row 4# to neutral x 20 Sidelying ER to neutral x 20 Reactive isometric row blue TB x 20 Shoulder abd x 20 yellow TB Shoulder diagonals within protocol limits x 10 yellow TB  Modalities: Vaso x 10 minutes low pressure 34 degrees   PATIENT EDUCATION: Education details: using coregeous ball for thoracic extension stretch (posture) Person educated: Patient Education method: Explanation, Demonstration, and Handouts Education comprehension: verbalized understanding and returned demonstration  HOME EXERCISE PROGRAM: Access Code: [email or web address] URL: [email or web address] Date:  09/25/2022 Prepared by: Reggy Eye  Exercises - Standing Single Arm Shoulder Flexion with Posterior Anchored Resistance  - 1 x daily - 7 x weekly - 3 sets - 10 reps - Standing Single Arm Shoulder Abduction with Resistance  - 1 x daily - 7 x weekly - 3 sets - 10 reps - Shoulder External Rotation Reactive Isometrics  - 1 x daily - 7 x weekly - 3 sets - 10 reps - Standing Shoulder Row with Anchored Resistance  - 1 x daily - 7 x weekly - 3 sets - 10 reps - Standing Shoulder External Rotation Stretch in Doorway  - 1 x daily - 7 x weekly - 1 sets - 10 reps - 10 seconds hold - Standing Single Arm Shoulder Flexion Stretch on Wall  - 1 x daily - 7 x weekly - 1 sets - 10 reps - 10 sec hold  ASSESSMENT:  CLINICAL IMPRESSION:  Pt more emotionally labile this visit due to depression from not being able to work and work out for so long. Pt is progressing well towards goals as allowed per protocol. He will continue to benefit from skilled PT to work towards return to PLOF.   OBJECTIVE IMPAIRMENTS: decreased activity tolerance, decreased ROM, decreased strength, increased edema, and impaired UE functional use.    GOALS: Goals reviewed with patient? Yes  SHORT TERM GOALS: Target date: 10/09/2022  Pt will be independent in initial HEP Baseline: Goal status: MET   LONG TERM GOALS: Target date: 12/17/2022    Pt will improve strength to 4+/5 to return to full work out routine Baseline:  Goal status: IN PROGRESS  2.  Pt will be independent in advanced HEP Baseline:  Goal status: IN PROGRESS  3.  Pt will improve Lt shoulder ROM to 160 degrees flexion and abduction, 60 degrees ER to improve mobility for IADLs Baseline:  Goal status: IN PROGRESS   PLAN:  PT FREQUENCY: 1-2x/week  PT DURATION: 6 weeks  PLANNED INTERVENTIONS: Therapeutic exercises, Therapeutic activity, Neuromuscular re-education, Balance training, Gait training, Patient/Family education, Self Care, Joint mobilization,  Aquatic Therapy, Dry Needling, Electrical stimulation, Cryotherapy, Moist heat, Taping, Vasopneumatic device, Ultrasound, Ionotophoresis 4mg /ml Dexamethasone, Manual therapy, and Re-evaluation  PLAN FOR NEXT SESSION: Progress shoulder strength per protocol, core strengthening, ROM as tolerated  Reggy Eye, PT,DPT05/12/2410:15 PM

## 2022-11-12 ENCOUNTER — Encounter: Payer: Self-pay | Admitting: Physical Therapy

## 2022-11-12 ENCOUNTER — Ambulatory Visit: Payer: Medicare Other | Admitting: Physical Therapy

## 2022-11-12 DIAGNOSIS — M6281 Muscle weakness (generalized): Secondary | ICD-10-CM

## 2022-11-12 DIAGNOSIS — G8929 Other chronic pain: Secondary | ICD-10-CM

## 2022-11-12 DIAGNOSIS — R600 Localized edema: Secondary | ICD-10-CM

## 2022-11-12 NOTE — Therapy (Signed)
OUTPATIENT PHYSICAL THERAPY SHOULDER TREATMENT AND RECERTIFICATION   Patient Name: Raymond Walsh MRN: [id number] DOB:04-07-1956, 67 y.o., male Today's Date: 11/12/2022  END OF SESSION:  PT End of Session - 11/12/22 1222     Visit Number 12    Number of Visits 23    Date for PT Re-Evaluation 12/17/22    Authorization Type medicare    Authorization - Visit Number 12    Progress Note Due on Visit 20    PT Start Time 1145    PT Stop Time 1220    PT Time Calculation (min) 35 min    Activity Tolerance Patient tolerated treatment well    Behavior During Therapy WFL for tasks assessed/performed                  Past Medical History:  Diagnosis Date   A-fib (HCC)    Arthritis    hands,   Cancer (HCC)    prostate   Dysrhythmia 08/2019   A-fib from energy medication   History of deep vein thrombosis (DVT) of lower extremity    Past Surgical History:  Procedure Laterality Date   ATRIAL FIBRILLATION ABLATION  2023   Dr. Lanell Persons SURGERY  2000   plate in W0--J8   KNEE ARTHROPLASTY Right 2023   TOTAL SHOULDER ARTHROPLASTY Left 08/14/2021   Procedure: LEFT TOTAL SHOULDER ARTHROPLASTY;  Surgeon: Ernestina Columbia, MD;  Location: [facility];  Service: Orthopedics;  Laterality: Left;   TOTAL SHOULDER REVISION Left 08/28/2022   Procedure: TOTAL SHOULDER REVISION;  Surgeon: Bjorn Pippin, MD;  Location: [facility];  Service: Orthopedics;  Laterality: Left;   Patient Active Problem List   Diagnosis Date Noted   Prosthetic joint infection (HCC) 09/03/2022   H/O total shoulder replacement, left 08/28/2022   Greater trochanteric pain syndrome of left lower extremity 10/20/2019    PCP: Karsten Fells  REFERRING PROVIDER: Ramond Marrow  REFERRING DIAG: s/p Lt reverse total shoulder  THERAPY DIAG:  Muscle weakness (generalized)  Localized edema  Chronic left shoulder pain  Rationale for Evaluation and Treatment: Rehabilitation  ONSET DATE: 08/28/22  SUBJECTIVE:                                                                                                                                                                                       SUBJECTIVE STATEMENT: 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 No  PRECAUTIONS: Other: Protocol in file. No resisted extension or IR for 3 months  WEIGHT BEARING RESTRICTIONS: No  FALLS:  Has patient fallen in last 6 months?  No  OCCUPATION: Games developer  PLOF: Independent  PATIENT GOALS:improve function of Lt UE  NEXT MD VISIT: 09/26/22  OBJECTIVE:   POSTURE: Rounded shoulders, forward head  UPPER EXTREMITY ROM:   Active ROM Right eval Left eval Left 10/31/22  Shoulder flexion  143 155  Shoulder extension     Shoulder abduction  120 140  Shoulder adduction     Shoulder internal rotation     Shoulder external rotation  35 45  Elbow flexion     Elbow extension     Wrist flexion     Wrist extension     Wrist ulnar deviation     Wrist radial deviation     Wrist pronation     Wrist supination     (Blank rows = not tested)  UPPER EXTREMITY MMT: Tested against gravity - no resistance on eval MMT Right eval Left eval Left 10/31/22  Shoulder flexion  3 4+  Shoulder extension     Shoulder abduction  3 4+  Shoulder adduction     Shoulder internal rotation     Shoulder external rotation  3 4  Middle trapezius     Lower trapezius     Elbow flexion     Elbow extension     Wrist flexion     Wrist extension     Wrist ulnar deviation     Wrist radial deviation     Wrist pronation     Wrist supination     Grip strength (lbs)     (Blank rows = not tested)   JOINT MOBILITY TESTING:  Shoulder jt mobility WFL  PALPATION:  No TTP shoulder joint   TODAY'S TREATMENT:   [facility] Adult PT Treatment:                                                DATE: 11/12/22 Therapeutic Exercise: Shoulder alphabet  capital and lower case to increase endurance in small range Flexed elbow shoulder flexion with yellow TB to activate serratus 2 x 10 Scaption 1# 2 x 10 Abduction with elbow flexion 1# 2 x 10 Tricep kick back 1 # x 20 Isometric ER step out green TB 2 x 10 Shoulder extension to neutral green TB x 20 Quick toss/catch 2kg med ball 2 x 30 sec Shoulder arc bouncing ball on wall 4 x 1 min   [facility] Adult PT Treatment:                                                DATE: 11/05/22 Therapeutic Exercise: Shoulder alphabet capital and lower case to increase endurance in small range Flexed elbow shoulder flexion with yellow TB to activate serratus 2 x 10 Overhead hold 1# x 1 lap Abduction to 90 hold 1# x 1 lap Scaption 1# 2 x 10 Abd 1# 2 x 10 with elbow bent Isometric ER step out green TB 2 x 10 Shoulder extension to neutral green TB x 10 Shoulder diagonals within protocol limits x 10 green TB Quick toss/catch 2kg med ball 2 x 30 sec  Modalities: Vaso x 10 minutes low pressure 34 degrees    [facility] Adult PT Treatment:  DATE: 10/31/2022 Therapeutic Exercise: Shoulder MMT & measurements (see above) Isometric shoulder ER step out GTB x10, 10x5" Bent arm shoulder flexion SA activation - YTB x10, stretch strap x10 Wall ABCs (small ball, small range) 90 degree shoulder flexion + 1#MB quick toss/catch 2x30" --> 2#MB x30" Modalities: Vaso x 10 minutes low pressure 34 degrees     PATIENT EDUCATION: Education details: using coregeous ball for thoracic extension stretch (posture) Person educated: Patient Education method: Explanation, Demonstration, and Handouts Education comprehension: verbalized understanding and returned demonstration  HOME EXERCISE PROGRAM: Access Code: [email or web address] URL: [email or web address] Date: 09/25/2022 Prepared by: Reggy Eye  Exercises - Standing Single Arm Shoulder Flexion with Posterior Anchored  Resistance  - 1 x daily - 7 x weekly - 3 sets - 10 reps - Standing Single Arm Shoulder Abduction with Resistance  - 1 x daily - 7 x weekly - 3 sets - 10 reps - Shoulder External Rotation Reactive Isometrics  - 1 x daily - 7 x weekly - 3 sets - 10 reps - Standing Shoulder Row with Anchored Resistance  - 1 x daily - 7 x weekly - 3 sets - 10 reps - Standing Shoulder External Rotation Stretch in Doorway  - 1 x daily - 7 x weekly - 1 sets - 10 reps - 10 seconds hold - Standing Single Arm Shoulder Flexion Stretch on Wall  - 1 x daily - 7 x weekly - 1 sets - 10 reps - 10 sec hold  ASSESSMENT:  CLINICAL IMPRESSION:  Pt continues with decreased mm endurance and decreased strength with shoulder flexion. He is progressing well within protcol   OBJECTIVE IMPAIRMENTS: decreased activity tolerance, decreased ROM, decreased strength, increased edema, and impaired UE functional use.    GOALS: Goals reviewed with patient? Yes  SHORT TERM GOALS: Target date: 10/09/2022  Pt will be independent in initial HEP Baseline: Goal status: MET   LONG TERM GOALS: Target date: 12/17/2022    Pt will improve strength to 4+/5 to return to full work out routine Baseline:  Goal status: IN PROGRESS  2.  Pt will be independent in advanced HEP Baseline:  Goal status: IN PROGRESS  3.  Pt will improve Lt shoulder ROM to 160 degrees flexion and abduction, 60 degrees ER to improve mobility for IADLs Baseline:  Goal status: IN PROGRESS   PLAN:  PT FREQUENCY: 1-2x/week  PT DURATION: 6 weeks  PLANNED INTERVENTIONS: Therapeutic exercises, Therapeutic activity, Neuromuscular re-education, Balance training, Gait training, Patient/Family education, Self Care, Joint mobilization, Aquatic Therapy, Dry Needling, Electrical stimulation, Cryotherapy, Moist heat, Taping, Vasopneumatic device, Ultrasound, Ionotophoresis 4mg /ml Dexamethasone, Manual therapy, and Re-evaluation  PLAN FOR NEXT SESSION:PROGRESS PROTOCOL!  Shoulder strength and ROM  Reggy Eye, PT,DPT05/14/2412:23 PM

## 2022-11-19 ENCOUNTER — Encounter: Payer: Self-pay | Admitting: Physical Therapy

## 2022-11-19 ENCOUNTER — Ambulatory Visit: Payer: Medicare Other | Admitting: Physical Therapy

## 2022-11-19 DIAGNOSIS — M6281 Muscle weakness (generalized): Secondary | ICD-10-CM | POA: Diagnosis not present

## 2022-11-19 DIAGNOSIS — R29898 Other symptoms and signs involving the musculoskeletal system: Secondary | ICD-10-CM

## 2022-11-19 DIAGNOSIS — G8929 Other chronic pain: Secondary | ICD-10-CM

## 2022-11-19 DIAGNOSIS — R600 Localized edema: Secondary | ICD-10-CM

## 2022-11-19 NOTE — Therapy (Signed)
OUTPATIENT PHYSICAL THERAPY SHOULDER TREATMENT AND RECERTIFICATION   Patient Name: Raymond Walsh MRN: [id number] DOB:May 25, 1956, 67 y.o., male Today's Date: 11/19/2022  END OF SESSION:  PT End of Session - 11/19/22 1214     Visit Number 13    Number of Visits 23    Date for PT Re-Evaluation 12/17/22    Authorization - Visit Number 13    Progress Note Due on Visit 20    PT Start Time 1142    PT Stop Time 1222    PT Time Calculation (min) 40 min    Activity Tolerance Patient tolerated treatment well    Behavior During Therapy WFL for tasks assessed/performed                   Past Medical History:  Diagnosis Date   A-fib (HCC)    Arthritis    hands,   Cancer (HCC)    prostate   Dysrhythmia 08/2019   A-fib from energy medication   History of deep vein thrombosis (DVT) of lower extremity    Past Surgical History:  Procedure Laterality Date   ATRIAL FIBRILLATION ABLATION  2023   Dr. Lanell Persons SURGERY  2000   plate in W0--J8   KNEE ARTHROPLASTY Right 2023   TOTAL SHOULDER ARTHROPLASTY Left 08/14/2021   Procedure: LEFT TOTAL SHOULDER ARTHROPLASTY;  Surgeon: Ernestina Columbia, MD;  Location: [facility];  Service: Orthopedics;  Laterality: Left;   TOTAL SHOULDER REVISION Left 08/28/2022   Procedure: TOTAL SHOULDER REVISION;  Surgeon: Bjorn Pippin, MD;  Location: [facility];  Service: Orthopedics;  Laterality: Left;   Patient Active Problem List   Diagnosis Date Noted   Prosthetic joint infection (HCC) 09/03/2022   H/O total shoulder replacement, left 08/28/2022   Greater trochanteric pain syndrome of left lower extremity 10/20/2019    PCP: Karsten Fells  REFERRING PROVIDER: Ramond Marrow  REFERRING DIAG: s/p Lt reverse total shoulder  THERAPY DIAG:  Muscle weakness (generalized)  Localized edema  Chronic left shoulder pain  Other symptoms and signs involving the musculoskeletal system  Rationale for Evaluation and Treatment: Rehabilitation  ONSET  DATE: 08/28/22  SUBJECTIVE:                                                                                                                                                                                      SUBJECTIVE STATEMENT: Patient with no new complaints  PERTINENT HISTORY: Shoulder surgery 08/14/22  PAIN:  Are you having pain? No  PRECAUTIONS: Other: Protocol in file. No resisted extension or IR for 3 months  WEIGHT BEARING RESTRICTIONS: No  FALLS:  Has patient fallen in last  6 months? No  OCCUPATION: Games developer  PLOF: Independent  PATIENT GOALS:improve function of Lt UE  NEXT MD VISIT: 09/26/22  OBJECTIVE:   POSTURE: Rounded shoulders, forward head  UPPER EXTREMITY ROM:   Active ROM Right eval Left eval Left 10/31/22  Shoulder flexion  143 155  Shoulder extension     Shoulder abduction  120 140  Shoulder adduction     Shoulder internal rotation     Shoulder external rotation  35 45  Elbow flexion     Elbow extension     Wrist flexion     Wrist extension     Wrist ulnar deviation     Wrist radial deviation     Wrist pronation     Wrist supination     (Blank rows = not tested)  UPPER EXTREMITY MMT: Tested against gravity - no resistance on eval MMT Right eval Left eval Left 10/31/22  Shoulder flexion  3 4+  Shoulder extension     Shoulder abduction  3 4+  Shoulder adduction     Shoulder internal rotation     Shoulder external rotation  3 4  Middle trapezius     Lower trapezius     Elbow flexion     Elbow extension     Wrist flexion     Wrist extension     Wrist ulnar deviation     Wrist radial deviation     Wrist pronation     Wrist supination     Grip strength (lbs)     (Blank rows = not tested)   JOINT MOBILITY TESTING:  Shoulder jt mobility WFL  PALPATION:  No TTP shoulder joint   TODAY'S TREATMENT:   [facility] Adult PT Treatment:                                                DATE: 11/19/22 Therapeutic Exercise: UBE  L1 x 4 min alt fwd/bkwd Scaption with opp UE hold 3# Abduction with elbow flexion 2 x 10 3# Y,T,I green TB x 15 Scap clock yellow TB x 10 Flexed elbow shoulder flexion with red TB to activate serratus 2 x 10 IR green TB x 20 ER step outs green TB x 20 Wall slide abduction stretch ER behind the head stretch  Modalities: Vaso x 10 min low pressure 34 degrees   [facility] Adult PT Treatment:                                                DATE: 11/12/22 Therapeutic Exercise: Shoulder alphabet capital and lower case to increase endurance in small range Flexed elbow shoulder flexion with yellow TB to activate serratus 2 x 10 Scaption 1# 2 x 10 Abduction with elbow flexion 1# 2 x 10 Tricep kick back 1 # x 20 Isometric ER step out green TB 2 x 10 Shoulder extension to neutral green TB x 20 Quick toss/catch 2kg med ball 2 x 30 sec Shoulder arc bouncing ball on wall 4 x 1 min   [facility] Adult PT Treatment:  DATE: 11/05/22 Therapeutic Exercise: Shoulder alphabet capital and lower case to increase endurance in small range Flexed elbow shoulder flexion with yellow TB to activate serratus 2 x 10 Overhead hold 1# x 1 lap Abduction to 90 hold 1# x 1 lap Scaption 1# 2 x 10 Abd 1# 2 x 10 with elbow bent Isometric ER step out green TB 2 x 10 Shoulder extension to neutral green TB x 10 Shoulder diagonals within protocol limits x 10 green TB Quick toss/catch 2kg med ball 2 x 30 sec  Modalities: Vaso x 10 minutes low pressure 34 degrees   PATIENT EDUCATION: Education details: using coregeous ball for thoracic extension stretch (posture) Person educated: Patient Education method: Explanation, Demonstration, and Handouts Education comprehension: verbalized understanding and returned demonstration  HOME EXERCISE PROGRAM: Access Code: [email or web address] URL: [email or web address] Date: 09/25/2022 Prepared by: Reggy Eye  Exercises - Standing  Single Arm Shoulder Flexion with Posterior Anchored Resistance  - 1 x daily - 7 x weekly - 3 sets - 10 reps - Standing Single Arm Shoulder Abduction with Resistance  - 1 x daily - 7 x weekly - 3 sets - 10 reps - Shoulder External Rotation Reactive Isometrics  - 1 x daily - 7 x weekly - 3 sets - 10 reps - Standing Shoulder Row with Anchored Resistance  - 1 x daily - 7 x weekly - 3 sets - 10 reps - Standing Shoulder External Rotation Stretch in Doorway  - 1 x daily - 7 x weekly - 1 sets - 10 reps - 10 seconds hold - Standing Single Arm Shoulder Flexion Stretch on Wall  - 1 x daily - 7 x weekly - 1 sets - 10 reps - 10 sec hold  ASSESSMENT:  CLINICAL IMPRESSION:  Pt with good tolerance to progression of protocol. He is improving ROM in all directions. He will continue to benefit from increased strength especially in ER and increased muscular endurance.    OBJECTIVE IMPAIRMENTS: decreased activity tolerance, decreased ROM, decreased strength, increased edema, and impaired UE functional use.    GOALS: Goals reviewed with patient? Yes  SHORT TERM GOALS: Target date: 10/09/2022  Pt will be independent in initial HEP Baseline: Goal status: MET   LONG TERM GOALS: Target date: 12/17/2022    Pt will improve strength to 4+/5 to return to full work out routine Baseline:  Goal status: IN PROGRESS  2.  Pt will be independent in advanced HEP Baseline:  Goal status: IN PROGRESS  3.  Pt will improve Lt shoulder ROM to 160 degrees flexion and abduction, 60 degrees ER to improve mobility for IADLs Baseline:  Goal status: IN PROGRESS   PLAN:  PT FREQUENCY: 1-2x/week  PT DURATION: 6 weeks  PLANNED INTERVENTIONS: Therapeutic exercises, Therapeutic activity, Neuromuscular re-education, Balance training, Gait training, Patient/Family education, Self Care, Joint mobilization, Aquatic Therapy, Dry Needling, Electrical stimulation, Cryotherapy, Moist heat, Taping, Vasopneumatic device, Ultrasound,  Ionotophoresis 4mg /ml Dexamethasone, Manual therapy, and Re-evaluation  PLAN FOR NEXT SESSION:Shoulder strength and ROM  Reggy Eye, PT,DPT05/21/2412:14 PM

## 2022-11-21 ENCOUNTER — Ambulatory Visit: Payer: Medicare Other

## 2022-11-21 DIAGNOSIS — M6281 Muscle weakness (generalized): Secondary | ICD-10-CM

## 2022-11-21 DIAGNOSIS — R293 Abnormal posture: Secondary | ICD-10-CM

## 2022-11-21 DIAGNOSIS — R600 Localized edema: Secondary | ICD-10-CM

## 2022-11-21 DIAGNOSIS — R29898 Other symptoms and signs involving the musculoskeletal system: Secondary | ICD-10-CM

## 2022-11-21 DIAGNOSIS — G8929 Other chronic pain: Secondary | ICD-10-CM

## 2022-11-21 NOTE — Therapy (Signed)
OUTPATIENT PHYSICAL THERAPY SHOULDER TREATMENT   Patient Name: Raymond Walsh MRN: [id number] DOB:1955/11/03, 67 y.o., male Today's Date: 11/21/2022  END OF SESSION:  PT End of Session - 11/21/22 1147     Visit Number 14    Number of Visits 23    Date for PT Re-Evaluation 12/17/22    Authorization Type medicare    Authorization - Visit Number 14    Authorization - Number of Visits 23    Progress Note Due on Visit 20    PT Start Time 1148    PT Stop Time 1226    PT Time Calculation (min) 38 min    Activity Tolerance Patient tolerated treatment well    Behavior During Therapy WFL for tasks assessed/performed             Past Medical History:  Diagnosis Date   A-fib (HCC)    Arthritis    hands,   Cancer (HCC)    prostate   Dysrhythmia 08/2019   A-fib from energy medication   History of deep vein thrombosis (DVT) of lower extremity    Past Surgical History:  Procedure Laterality Date   ATRIAL FIBRILLATION ABLATION  2023   Dr. Lanell Persons SURGERY  2000   plate in W0--J8   KNEE ARTHROPLASTY Right 2023   TOTAL SHOULDER ARTHROPLASTY Left 08/14/2021   Procedure: LEFT TOTAL SHOULDER ARTHROPLASTY;  Surgeon: Ernestina Columbia, MD;  Location: [facility];  Service: Orthopedics;  Laterality: Left;   TOTAL SHOULDER REVISION Left 08/28/2022   Procedure: TOTAL SHOULDER REVISION;  Surgeon: Bjorn Pippin, MD;  Location: [facility];  Service: Orthopedics;  Laterality: Left;   Patient Active Problem List   Diagnosis Date Noted   Prosthetic joint infection (HCC) 09/03/2022   H/O total shoulder replacement, left 08/28/2022   Greater trochanteric pain syndrome of left lower extremity 10/20/2019    PCP: Karsten Fells  REFERRING PROVIDER: Ramond Marrow  REFERRING DIAG: s/p Lt reverse total shoulder  THERAPY DIAG:  Muscle weakness (generalized)  Localized edema  Chronic left shoulder pain  Other symptoms and signs involving the musculoskeletal system  Abnormal  posture  Rationale for Evaluation and Treatment: Rehabilitation  ONSET DATE: 08/28/22  SUBJECTIVE:                                                                                                                                                                                      SUBJECTIVE STATEMENT: Patient reports continued soreness with bent arm raises in flexion and abduction, however no significant pain.  PERTINENT HISTORY: Shoulder surgery 08/14/22  PAIN:  Are you having pain? No  PRECAUTIONS: Other: Protocol in file.  No resisted extension or IR for 3 months  WEIGHT BEARING RESTRICTIONS: No  FALLS:  Has patient fallen in last 6 months? No  OCCUPATION: Games developer  PLOF: Independent  PATIENT GOALS:improve function of Lt UE  NEXT MD VISIT: 11/21/22  OBJECTIVE:   POSTURE: Rounded shoulders, forward head  UPPER EXTREMITY ROM:   Active ROM Right eval Left eval Left 10/31/22 Left 11/21/22  Shoulder flexion  143 155   Shoulder extension      Shoulder abduction  120 140   Shoulder adduction      Shoulder internal rotation      Shoulder external rotation  35 45   Elbow flexion      Elbow extension      Wrist flexion      Wrist extension      Wrist ulnar deviation      Wrist radial deviation      Wrist pronation      Wrist supination      (Blank rows = not tested)  UPPER EXTREMITY MMT: Tested against gravity - no resistance on eval MMT Right eval Left eval Left 10/31/22 Left 11/21/22  Shoulder flexion  3 4+   Shoulder extension      Shoulder abduction  3 4+   Shoulder adduction      Shoulder internal rotation      Shoulder external rotation  3 4   Middle trapezius      Lower trapezius      Elbow flexion      Elbow extension      Wrist flexion      Wrist extension      Wrist ulnar deviation      Wrist radial deviation      Wrist pronation      Wrist supination      Grip strength (lbs)      (Blank rows = not tested)   JOINT MOBILITY  TESTING:  Shoulder jt mobility WFL  PALPATION:  No TTP shoulder joint   TODAY'S TREATMENT:   [facility] Adult PT Treatment:                                                DATE: 11/21/2022 Therapeutic Exercise: UBE L1 x 5 min Supine passive L shoulder stretching: flex, abd, ER Shoulder ext & IR isometrics 10x5" each 5#DB low bicep curl & lateral arm raises 90 degrees YTB R shoulder ER 10x3"  I & T GTB x 15 Shoulder extension to neutral GTB: low, mid, high x10 each  Quick toss and catch 2#MB: flexion & abduction 3x30" each   [facility] Adult PT Treatment:                                                DATE: 11/19/22 Therapeutic Exercise: UBE L1 x 4 min alt fwd/bkwd Scaption with opp UE hold 3# Abduction with elbow flexion 2 x 10 3# Y,T,I green TB x 15 Scap clock yellow TB x 10 Flexed elbow shoulder flexion with red TB to activate serratus 2 x 10 IR green TB x 20 ER step outs green TB x 20 Wall slide abduction stretch ER behind the head stretch Modalities: Vaso x 10 min low pressure 34  degrees   [facility] Adult PT Treatment:                                                DATE: 11/12/22 Therapeutic Exercise: Shoulder alphabet capital and lower case to increase endurance in small range Flexed elbow shoulder flexion with yellow TB to activate serratus 2 x 10 Scaption 1# 2 x 10 Abduction with elbow flexion 1# 2 x 10 Tricep kick back 1 # x 20 Isometric ER step out green TB 2 x 10 Shoulder extension to neutral green TB x 20 Quick toss/catch 2kg med ball 2 x 30 sec Shoulder arc bouncing ball on wall 4 x 1 min   PATIENT EDUCATION: Education details: using coregeous ball for thoracic extension stretch (posture) Person educated: Patient Education method: Explanation, Demonstration, and Handouts Education comprehension: verbalized understanding and returned demonstration  HOME EXERCISE PROGRAM: Access Code: [email or web address] URL: [email or web address] Date: 09/25/2022 Prepared by: Reggy Eye  Exercises - Standing Single Arm Shoulder Flexion with Posterior Anchored Resistance  - 1 x daily - 7 x weekly - 3 sets - 10 reps - Standing Single Arm Shoulder Abduction with Resistance  - 1 x daily - 7 x weekly - 3 sets - 10 reps - Shoulder External Rotation Reactive Isometrics  - 1 x daily - 7 x weekly - 3 sets - 10 reps - Standing Shoulder Row with Anchored Resistance  - 1 x daily - 7 x weekly - 3 sets - 10 reps - Standing Shoulder External Rotation Stretch in Doorway  - 1 x daily - 7 x weekly - 1 sets - 10 reps - 10 seconds hold - Standing Single Arm Shoulder Flexion Stretch on Wall  - 1 x daily - 7 x weekly - 1 sets - 10 reps - 10 sec hold  ASSESSMENT:  CLINICAL IMPRESSION:  Shoulder extension and internal rotation isometrics introduced as per Phase III protocol. Tactile cues provided to decrease upper trapezius compensation and postural alignment.     OBJECTIVE IMPAIRMENTS: decreased activity tolerance, decreased ROM, decreased strength, increased edema, and impaired UE functional use.    GOALS: Goals reviewed with patient? Yes  SHORT TERM GOALS: Target date: 10/09/2022  Pt will be independent in initial HEP Baseline: Goal status: MET   LONG TERM GOALS: Target date: 12/17/2022  Pt will improve strength to 4+/5 to return to full work out routine Baseline:  Goal status: IN PROGRESS  2.  Pt will be independent in advanced HEP Baseline:  Goal status: IN PROGRESS  3.  Pt will improve Lt shoulder ROM to 160 degrees flexion and abduction, 60 degrees ER to improve mobility for IADLs Baseline:  Goal status: IN PROGRESS   PLAN:  PT FREQUENCY: 1-2x/week  PT DURATION: 6 weeks  PLANNED INTERVENTIONS: Therapeutic exercises, Therapeutic activity, Neuromuscular re-education, Balance training, Gait training, Patient/Family education, Self Care, Joint mobilization, Aquatic Therapy, Dry Needling, Electrical stimulation, Cryotherapy, Moist heat, Taping, Vasopneumatic  device, Ultrasound, Ionotophoresis 4mg /ml Dexamethasone, Manual therapy, and Re-evaluation  PLAN FOR NEXT SESSION: Shoulder strength and ROM per protocol (Phase III)  Carlynn Herald, PTA 11/21/2022 12:30 PM

## 2022-11-26 ENCOUNTER — Ambulatory Visit: Payer: Medicare Other | Admitting: Physical Therapy

## 2022-11-28 ENCOUNTER — Ambulatory Visit: Payer: Medicare Other

## 2022-12-03 ENCOUNTER — Ambulatory Visit: Payer: Medicare Other | Admitting: Physical Therapy

## 2022-12-05 ENCOUNTER — Ambulatory Visit: Payer: Medicare Other | Admitting: Physical Therapy

## 2022-12-10 ENCOUNTER — Ambulatory Visit: Payer: Medicare Other | Attending: Orthopaedic Surgery

## 2022-12-10 ENCOUNTER — Encounter: Payer: Self-pay | Admitting: Family

## 2022-12-10 ENCOUNTER — Other Ambulatory Visit: Payer: Self-pay

## 2022-12-10 ENCOUNTER — Ambulatory Visit (INDEPENDENT_AMBULATORY_CARE_PROVIDER_SITE_OTHER): Payer: Medicare Other | Admitting: Family

## 2022-12-10 VITALS — BP 116/76 | HR 61 | Temp 97.6°F | Ht 71.0 in | Wt 210.0 lb

## 2022-12-10 DIAGNOSIS — M6281 Muscle weakness (generalized): Secondary | ICD-10-CM | POA: Insufficient documentation

## 2022-12-10 DIAGNOSIS — R293 Abnormal posture: Secondary | ICD-10-CM | POA: Insufficient documentation

## 2022-12-10 DIAGNOSIS — R600 Localized edema: Secondary | ICD-10-CM | POA: Insufficient documentation

## 2022-12-10 DIAGNOSIS — R29898 Other symptoms and signs involving the musculoskeletal system: Secondary | ICD-10-CM | POA: Diagnosis present

## 2022-12-10 DIAGNOSIS — T8459XD Infection and inflammatory reaction due to other internal joint prosthesis, subsequent encounter: Secondary | ICD-10-CM | POA: Diagnosis present

## 2022-12-10 DIAGNOSIS — G8929 Other chronic pain: Secondary | ICD-10-CM | POA: Diagnosis present

## 2022-12-10 DIAGNOSIS — T8450XD Infection and inflammatory reaction due to unspecified internal joint prosthesis, subsequent encounter: Secondary | ICD-10-CM

## 2022-12-10 DIAGNOSIS — M25512 Pain in left shoulder: Secondary | ICD-10-CM | POA: Diagnosis present

## 2022-12-10 NOTE — Therapy (Signed)
OUTPATIENT PHYSICAL THERAPY SHOULDER TREATMENT   Patient Name: Raymond Walsh MRN: [id number] DOB:[birth year], 67 y.o., male Today's Date: 12/10/2022  END OF SESSION:  PT End of Session - 12/10/22 1152     Visit Number 15    Number of Visits 23    Date for PT Re-Evaluation 12/17/22    Authorization Type medicare    Authorization - Visit Number 15    Authorization - Number of Visits 23    PT Start Time 1150    PT Stop Time 1230    PT Time Calculation (min) 40 min    Activity Tolerance Patient tolerated treatment well    Behavior During Therapy WFL for tasks assessed/performed             Past Medical History:  Diagnosis Date   A-fib (HCC)    Arthritis    hands,   Cancer (HCC)    prostate   Dysrhythmia 08/2019   A-fib from energy medication   History of deep vein thrombosis (DVT) of lower extremity    Past Surgical History:  Procedure Laterality Date   ATRIAL FIBRILLATION ABLATION  2023   Dr. Lanell Persons SURGERY  2000   plate in W0--J8   KNEE ARTHROPLASTY Right 2023   TOTAL SHOULDER ARTHROPLASTY Left 08/14/2021   Procedure: LEFT TOTAL SHOULDER ARTHROPLASTY;  Surgeon: Ernestina Columbia, MD;  Location: [facility];  Service: Orthopedics;  Laterality: Left;   TOTAL SHOULDER REVISION Left 08/28/2022   Procedure: TOTAL SHOULDER REVISION;  Surgeon: Bjorn Pippin, MD;  Location: [facility];  Service: Orthopedics;  Laterality: Left;   Patient Active Problem List   Diagnosis Date Noted   Prosthetic joint infection (HCC) 09/03/2022   H/O total shoulder replacement, left 08/28/2022   Greater trochanteric pain syndrome of left lower extremity 10/20/2019    PCP: Karsten Fells  REFERRING PROVIDER: Ramond Marrow  REFERRING DIAG: s/p Lt reverse total shoulder  THERAPY DIAG:  Muscle weakness (generalized)  Localized edema  Chronic left shoulder pain  Other symptoms and signs involving the musculoskeletal system  Abnormal posture  Rationale for Evaluation and Treatment:  Rehabilitation  ONSET DATE: 08/28/22  SUBJECTIVE:                                                                                                                                                                                      SUBJECTIVE STATEMENT: Patient reports he is still a little weak from illness but overall is doing fine.  PERTINENT HISTORY: Shoulder surgery 08/14/22  PAIN:  Are you having pain? No  PRECAUTIONS: Other: Protocol in file. No resisted extension or IR for 3 months  WEIGHT  BEARING RESTRICTIONS: No  FALLS:  Has patient fallen in last 6 months? No  OCCUPATION: Games developer  PLOF: Independent  PATIENT GOALS:improve function of Lt UE  NEXT MD VISIT: 11/21/22  OBJECTIVE:   POSTURE: Rounded shoulders, forward head  UPPER EXTREMITY ROM:   Active ROM Right eval Left eval Left 10/31/22 Left 12/10/22  Shoulder flexion  143 155 155  Shoulder extension      Shoulder abduction  120 140 150  Shoulder adduction      Shoulder internal rotation      Shoulder external rotation  35 45 45  Elbow flexion      Elbow extension      Wrist flexion      Wrist extension      Wrist ulnar deviation      Wrist radial deviation      Wrist pronation      Wrist supination      (Blank rows = not tested)  UPPER EXTREMITY MMT: Tested against gravity - no resistance on eval MMT Right eval Left eval Left 10/31/22   Shoulder flexion  3 4+   Shoulder extension      Shoulder abduction  3 4+   Shoulder adduction      Shoulder internal rotation      Shoulder external rotation  3 4   Middle trapezius      Lower trapezius      Elbow flexion      Elbow extension      Wrist flexion      Wrist extension      Wrist ulnar deviation      Wrist radial deviation      Wrist pronation      Wrist supination      Grip strength (lbs)      (Blank rows = not tested)   JOINT MOBILITY TESTING:  Shoulder jt mobility WFL  PALPATION:  No TTP shoulder  joint   TODAY'S TREATMENT:   [facility] Adult PT Treatment:                                                DATE: 12/10/2022 Therapeutic Exercise: UBE L4 x 2 min fwd/2 min bkwd Shoulder ext & IR isometric 10x5" Shoulder IR stretch w/ strap  Shoulder ABC orange SB 5#DB bicep & hammer curls, focus on eccentric phase  L shoulder ER YTB 3x10 Scap push ups on wall  Incline plank + weight shifting for shoulder stabilization Shoulder IR stretch    [facility] Adult PT Treatment:                                                DATE: 11/21/2022 Therapeutic Exercise: UBE L1 x 5 min Supine passive L shoulder stretching: flex, abd, ER Shoulder ext & IR isometrics 10x5" each 5#DB low bicep curl & lateral arm raises 90 degrees YTB R shoulder ER 10x3"  I & T GTB x 15 Shoulder extension to neutral GTB: low, mid, high x10 each  Quick toss and catch 2#MB: flexion & abduction 3x30" each   [facility] Adult PT Treatment:  DATE: 11/19/22 Therapeutic Exercise: UBE L1 x 4 min alt fwd/bkwd Scaption with opp UE hold 3# Abduction with elbow flexion 2 x 10 3# Y,T,I green TB x 15 Scap clock yellow TB x 10 Flexed elbow shoulder flexion with red TB to activate serratus 2 x 10 IR green TB x 20 ER step outs green TB x 20 Wall slide abduction stretch ER behind the head stretch Modalities: Vaso x 10 min low pressure 34 degrees   PATIENT EDUCATION: Education details: using coregeous ball for thoracic extension stretch (posture) Person educated: Patient Education method: Explanation, Demonstration, and Handouts Education comprehension: verbalized understanding and returned demonstration  HOME EXERCISE PROGRAM: Access Code: [email or web address] URL: [email or web address] Date: 09/25/2022 Prepared by: Reggy Eye  Exercises - Standing Single Arm Shoulder Flexion with Posterior Anchored Resistance  - 1 x daily - 7 x weekly - 3 sets - 10 reps - Standing Single Arm  Shoulder Abduction with Resistance  - 1 x daily - 7 x weekly - 3 sets - 10 reps - Shoulder External Rotation Reactive Isometrics  - 1 x daily - 7 x weekly - 3 sets - 10 reps - Standing Shoulder Row with Anchored Resistance  - 1 x daily - 7 x weekly - 3 sets - 10 reps - Standing Shoulder External Rotation Stretch in Doorway  - 1 x daily - 7 x weekly - 1 sets - 10 reps - 10 seconds hold - Standing Single Arm Shoulder Flexion Stretch on Wall  - 1 x daily - 7 x weekly - 1 sets - 10 reps - 10 sec hold  ASSESSMENT:  CLINICAL IMPRESSION:  Shoulder stabilization challenged with progression of weight bearing in inclined plank position; tactile cues improved postural stability. Patient continues to demonstrate shoulder ER weakness and is quick to fatigue with lightly resisted horizontal shoulder ER.    OBJECTIVE IMPAIRMENTS: decreased activity tolerance, decreased ROM, decreased strength, increased edema, and impaired UE functional use.    GOALS: Goals reviewed with patient? Yes  SHORT TERM GOALS: Target date: 10/09/2022  Pt will be independent in initial HEP Baseline: Goal status: MET   LONG TERM GOALS: Target date: 12/17/2022  Pt will improve strength to 4+/5 to return to full work out routine Baseline:  Goal status: IN PROGRESS  2.  Pt will be independent in advanced HEP Baseline:  Goal status: IN PROGRESS  3.  Pt will improve Lt shoulder ROM to 160 degrees flexion and abduction, 60 degrees ER to improve mobility for IADLs Baseline:  Goal status: IN PROGRESS   PLAN:  PT FREQUENCY: 1-2x/week  PT DURATION: 6 weeks  PLANNED INTERVENTIONS: Therapeutic exercises, Therapeutic activity, Neuromuscular re-education, Balance training, Gait training, Patient/Family education, Self Care, Joint mobilization, Aquatic Therapy, Dry Needling, Electrical stimulation, Cryotherapy, Moist heat, Taping, Vasopneumatic device, Ultrasound, Ionotophoresis 4mg /ml Dexamethasone, Manual therapy, and  Re-evaluation  PLAN FOR NEXT SESSION: Shoulder strength and ROM per protocol (Phase III)  Carlynn Herald, PTA 12/10/2022 12:34 PM

## 2022-12-10 NOTE — Patient Instructions (Signed)
Nice to see you.  Stop doxycycline.  Monitor for signs of infection with pain being the primary symptom. This may include redness, swelling and increased temperature.   Have a great day and stay safe!

## 2022-12-10 NOTE — Progress Notes (Signed)
Subjective:    Patient ID: Raymond Walsh, male    DOB: 1955/09/25, 67 y.o.   MRN: [id number]  Chief Complaint  Patient presents with   Follow-up   Prosthetic Joint Infection    HPI:  Raymond Walsh is a 67 y.o. 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 Inflammatory markers were normal. Here today for follow up.  Mr. Oldenburg has been doing well since his last office visit. Completed last dose of doxycycline about 2 days ago. Was recently sick with sinus related issues and missed physical therapy but is now getting back on track. Seen by Dr. Everardo Pacific with 9 month follow up. Shoulder is feeling well and denies any fevers or chills.    Allergies  Allergen Reactions   Hydromorphone Hcl Nausea And Vomiting   Codeine     Other Reaction(s): GI Intolerance   Oxycodone Nausea And Vomiting and Rash   Oxycodone-Acetaminophen Nausea And Vomiting and Rash      Outpatient Medications Prior to Visit  Medication Sig Dispense Refill   fluticasone (FLONASE) 50 MCG/ACT nasal spray USE 1 SPRAY IN EACH NOSTRIL ONCE DAILY AS NEEDED     tamsulosin (FLOMAX) 0.4 MG CAPS capsule Take 0.4 mg by mouth at bedtime.     ondansetron (ZOFRAN-ODT) 4 MG disintegrating tablet Take 1 tablet (4 mg total) by mouth every 6 (six) hours as needed for nausea or vomiting. (Patient not taking: Reported on 10/14/2022) 20 tablet 0   doxycycline (VIBRAMYCIN) 100 MG capsule Take 1 capsule (100 mg total) by mouth 2 (two) times daily. (Patient not taking: Reported on 12/10/2022) 60 capsule 1   No facility-administered medications prior to visit.     Past Medical History:  Diagnosis Date   A-fib [facility])    Arthritis    hands,   Cancer (HCC)    prostate   Dysrhythmia 08/2019   A-fib from energy medication   History of deep vein thrombosis (DVT) of lower extremity      Past Surgical  History:  Procedure Laterality Date   ATRIAL FIBRILLATION ABLATION  2023   Dr. Lanell Persons SURGERY  2000   plate in W0--J8   KNEE ARTHROPLASTY Right 2023   TOTAL SHOULDER ARTHROPLASTY Left 08/14/2021   Procedure: LEFT TOTAL SHOULDER ARTHROPLASTY;  Surgeon: Ernestina Columbia, MD;  Location: [facility];  Service: Orthopedics;  Laterality: Left;   TOTAL SHOULDER REVISION Left 08/28/2022   Procedure: TOTAL SHOULDER REVISION;  Surgeon: Bjorn Pippin, MD;  Location: [facility];  Service: Orthopedics;  Laterality: Left;       Review of Systems  Constitutional:  Negative for chills, diaphoresis, fatigue and fever.  Respiratory:  Negative for cough, chest tightness, shortness of breath and wheezing.   Cardiovascular:  Negative for chest pain.  Gastrointestinal:  Negative for abdominal pain, diarrhea, nausea and vomiting.      Objective:    BP 116/76   Pulse 61   Temp 97.6 F (36.4 C) (Oral)   Ht 5\' 11"  (1.803 m)   Wt 210 lb (95.3 kg)   SpO2 96%   BMI 29.29 kg/m  Nursing note and vital signs reviewed.  Physical Exam Constitutional:      General: He is not in acute distress.    Appearance: He is well-developed.  Cardiovascular:     Rate and Rhythm: Normal rate and regular rhythm.  Heart sounds: Normal heart sounds.  Pulmonary:     Effort: Pulmonary effort is normal.     Breath sounds: Normal breath sounds.  Skin:    General: Skin is warm and dry.  Neurological:     Mental Status: He is alert and oriented to person, place, and time.  Psychiatric:        Behavior: Behavior normal.        Thought Content: Thought content normal.        Judgment: Judgment normal.         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 Podolak has completed 3 months of total treatment (2 weeks IV) for P. Acnes left prosthetic shoulder infection and is doing very well with no evidence of residual infection on current exam. Discussed plan of care and there is no definitive way to ensure infection is completely resolved outside of stopping antibiotics. At this point given surgical and antibiotic treatment I think it is appropriate that we stop antibiotics at this time. Reviewed signs/symptoms to monitor for and when to seek further care. Continue follow up with Dr. Everardo Pacific as planned. Follow up with ID as needed.         I have discontinued Halim R. Beaumont's doxycycline. I am also having him maintain his tamsulosin, ondansetron, and fluticasone.    Follow-up: As needed   Marcos Eke, MSN, FNP-C Nurse Practitioner [facility] Main number: [phone]

## 2022-12-10 NOTE — Assessment & Plan Note (Signed)
Mr. Parcel has completed 3 months of total treatment (2 weeks IV) for P. Acnes left prosthetic shoulder infection and is doing very well with no evidence of residual infection on current exam. Discussed plan of care and there is no definitive way to ensure infection is completely resolved outside of stopping antibiotics. At this point given surgical and antibiotic treatment I think it is appropriate that we stop antibiotics at this time. Reviewed signs/symptoms to monitor for and when to seek further care. Continue follow up with Dr. Everardo Pacific as planned. Follow up with ID as needed.

## 2022-12-12 ENCOUNTER — Ambulatory Visit: Payer: Medicare Other

## 2022-12-12 DIAGNOSIS — R293 Abnormal posture: Secondary | ICD-10-CM

## 2022-12-12 DIAGNOSIS — R29898 Other symptoms and signs involving the musculoskeletal system: Secondary | ICD-10-CM

## 2022-12-12 DIAGNOSIS — M6281 Muscle weakness (generalized): Secondary | ICD-10-CM | POA: Diagnosis not present

## 2022-12-12 DIAGNOSIS — G8929 Other chronic pain: Secondary | ICD-10-CM

## 2022-12-12 DIAGNOSIS — R600 Localized edema: Secondary | ICD-10-CM

## 2022-12-12 NOTE — Therapy (Signed)
OUTPATIENT PHYSICAL THERAPY SHOULDER TREATMENT   Patient Name: Raymond Walsh MRN: [id number] DOB:02/27/1956, 67 y.o., male Today's Date: 12/12/2022  END OF SESSION:  PT End of Session - 12/12/22 1150     Visit Number 16    Number of Visits 23    Date for PT Re-Evaluation 12/17/22    Authorization Type medicare    Authorization - Visit Number 16    Authorization - Number of Visits 23    PT Start Time 1150    PT Stop Time 1230    PT Time Calculation (min) 40 min    Activity Tolerance Patient tolerated treatment well    Behavior During Therapy WFL for tasks assessed/performed             Past Medical History:  Diagnosis Date   A-fib (HCC)    Arthritis    hands,   Cancer (HCC)    prostate   Dysrhythmia 08/2019   A-fib from energy medication   History of deep vein thrombosis (DVT) of lower extremity    Past Surgical History:  Procedure Laterality Date   ATRIAL FIBRILLATION ABLATION  2023   Dr. 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   H/O total shoulder replacement, left 08/28/2022   Greater trochanteric pain syndrome of left lower extremity 10/20/2019    PCP: Karsten Fells  REFERRING PROVIDER: Ramond Marrow  REFERRING DIAG: s/p Lt reverse total shoulder  THERAPY DIAG:  Muscle weakness (generalized)  Localized edema  Chronic left shoulder pain  Other symptoms and signs involving the musculoskeletal system  Abnormal posture  Rationale for Evaluation and Treatment:  Rehabilitation  ONSET DATE: 08/28/22  SUBJECTIVE:                                                                                                                                                                                      SUBJECTIVE STATEMENT: Patient reports he can feel the set-back in strength after being sick for a few weeks.  PERTINENT HISTORY: Shoulder surgery 08/14/22  PAIN:  Are you having pain? No  PRECAUTIONS: Other: Protocol in file. No resisted extension or IR for 3 months  WEIGHT BEARING RESTRICTIONS: No  FALLS:  Has patient fallen in last 6 months? No  OCCUPATION: Games developer  PLOF: Independent  PATIENT GOALS:improve function of Lt UE  NEXT MD VISIT: 11/21/22  OBJECTIVE:   POSTURE: Rounded shoulders, forward head  UPPER EXTREMITY ROM:   Active ROM Right eval Left eval Left 10/31/22 Left 12/10/22  Shoulder flexion  143 155 155  Shoulder extension      Shoulder abduction  120 140 150  Shoulder adduction      Shoulder internal rotation      Shoulder external rotation  35 45 45  Elbow flexion      Elbow extension      Wrist flexion      Wrist extension      Wrist ulnar deviation      Wrist radial deviation      Wrist pronation      Wrist supination      (Blank rows = not tested)  UPPER EXTREMITY MMT: Tested against gravity - no resistance on eval MMT Right eval Left eval Left 10/31/22 Left 12/12/22  Shoulder flexion  3 4+ 5  Shoulder extension      Shoulder abduction  3 4+ 4+  Shoulder adduction      Shoulder internal rotation      Shoulder external rotation  3 4 4   Middle trapezius      Lower trapezius      Elbow flexion      Elbow extension      Wrist flexion      Wrist extension      Wrist ulnar deviation      Wrist radial deviation      Wrist pronation      Wrist supination      Grip strength (lbs)      (Blank rows = not tested)   JOINT MOBILITY TESTING:  Shoulder jt mobility WFL  PALPATION:  No TTP  shoulder joint   TODAY'S TREATMENT:   [facility] Adult PT Treatment:                                                DATE: 12/12/2022 Therapeutic Exercise: UBE L4 fwd/81min bkwd Shoulder ER isometric step out GTB x15 S/L shoulder ER 1#DB 3x10 Supine shoulder ER dynamic stretch 10x5" Standing shoulder ER (back against doorframe) 1#DB 2x10 Doorway shoulder ER stretch 5x10" Shoulder IR isometric presses 10x5" Shoulder extension isometric step out GTB x10 Shoulder IR isometric step out GTB x15    [facility] Adult PT Treatment:                                                DATE: 12/10/2022 Therapeutic Exercise: UBE L4 x 2 min fwd/2 min bkwd Shoulder ext & IR isometric 10x5" Shoulder IR stretch w/ strap  Shoulder ABC orange SB 5#DB bicep & hammer curls, focus on eccentric phase  L shoulder ER YTB 3x10 Scap push ups on wall  Incline plank + weight shifting for shoulder stabilization Shoulder IR stretch    [facility] Adult PT Treatment:  DATE: 11/21/2022 Therapeutic Exercise: UBE L1 x 5 min Supine passive L shoulder stretching: flex, abd, ER Shoulder ext & IR isometrics 10x5" each 5#DB low bicep curl & lateral arm raises 90 degrees YTB R shoulder ER 10x3"  I & T GTB x 15 Shoulder extension to neutral GTB: low, mid, high x10 each  Quick toss and catch 2#MB: flexion & abduction 3x30" each   PATIENT EDUCATION: Education details: Updated HEP Person educated: Patient Education method: Explanation, Demonstration, and Handouts Education comprehension: verbalized understanding and returned demonstration  HOME EXERCISE PROGRAM: Access Code: [email or web address] URL: [email or web address] Date: 12/12/2022 Prepared by: Carlynn Herald  Exercises - Standing Single Arm Shoulder Flexion with Posterior Anchored Resistance  - 1 x daily - 7 x weekly - 3 sets - 10 reps - Standing Single Arm Shoulder Abduction with Resistance  - 1 x daily - 7 x weekly - 3  sets - 10 reps - Shoulder External Rotation Reactive Isometrics  - 1 x daily - 7 x weekly - 3 sets - 10 reps - Standing Shoulder Row with Anchored Resistance  - 1 x daily - 7 x weekly - 3 sets - 10 reps - Standing Single Arm Shoulder Flexion Stretch on Wall  - 1 x daily - 7 x weekly - 1 sets - 10 reps - 10 sec hold - Sidelying Shoulder ER with Towel and Dumbbell  - 1 x daily - 7 x weekly - 3 sets - 10 reps - Supine Shoulder External Rotation Stretch  - 1 x daily - 7 x weekly - 3 sets - 10 reps - Standing Shoulder External Rotation Stretch in Doorway  - 1 x daily - 7 x weekly - 3 sets - 10 reps - Standing Cable Shoulder External Rotation   - 1 x daily - 7 x weekly - 3 sets - 10 reps - Standing Isometric Shoulder Extension with Doorway - Arm Bent  - 1 x daily - 7 x weekly - 3 sets - 10 reps - Shoulder Extension Reactive Isometrics with Elbow Extended  - 1 x daily - 7 x weekly - 3 sets - 10 reps - Shoulder Internal Rotation Reactive Isometrics  - 1 x daily - 7 x weekly - 3 sets - 10 reps  ASSESSMENT:  CLINICAL IMPRESSION:  Patient demonstrated good progression with shoulder flexion strength; continues to demonstrate range and strength deficits with external rotation. Exercises focused on shoulder external rotation mobility and strength; progressed shoulder IR and extension isometrics.    OBJECTIVE IMPAIRMENTS: decreased activity tolerance, decreased ROM, decreased strength, increased edema, and impaired UE functional use.    GOALS: Goals reviewed with patient? Yes  SHORT TERM GOALS: Target date: 10/09/2022  Pt will be independent in initial HEP Baseline: Goal status: MET   LONG TERM GOALS: Target date: 12/17/2022  Pt will improve strength to 4+/5 to return to full work out routine Baseline:  Goal status: IN PROGRESS  2.  Pt will be independent in advanced HEP Baseline:  Goal status: IN PROGRESS  3.  Pt will improve Lt shoulder ROM to 160 degrees flexion and abduction, 60 degrees  ER to improve mobility for IADLs Baseline:  Goal status: IN PROGRESS   PLAN:  PT FREQUENCY: 1-2x/week  PT DURATION: 6 weeks  PLANNED INTERVENTIONS: Therapeutic exercises, Therapeutic activity, Neuromuscular re-education, Balance training, Gait training, Patient/Family education, Self Care, Joint mobilization, Aquatic Therapy, Dry Needling, Electrical stimulation, Cryotherapy, Moist heat, Taping, Vasopneumatic device, Ultrasound, Ionotophoresis 4mg /ml Dexamethasone, Manual therapy, and Re-evaluation  PLAN FOR  NEXT SESSION: Shoulder ER strength & mobility; shoulder IR & ext isometrics  Carlynn Herald, PTA 12/12/2022 12:32 PM

## 2022-12-18 ENCOUNTER — Encounter: Payer: Self-pay | Admitting: Physical Therapy

## 2022-12-18 ENCOUNTER — Ambulatory Visit: Payer: Medicare Other | Admitting: Physical Therapy

## 2022-12-18 DIAGNOSIS — M6281 Muscle weakness (generalized): Secondary | ICD-10-CM

## 2022-12-18 DIAGNOSIS — R600 Localized edema: Secondary | ICD-10-CM

## 2022-12-18 DIAGNOSIS — G8929 Other chronic pain: Secondary | ICD-10-CM

## 2022-12-18 NOTE — Therapy (Addendum)
OUTPATIENT PHYSICAL THERAPY SHOULDER TREATMENT AND RECERTIFICATION AND DISCHARGE   Patient Name: Raymond Walsh MRN: [id number] DOB:13-Aug-1955, 67 y.o., male Today's Date: 12/18/2022  END OF SESSION:  PT End of Session - 12/18/22 1135     Visit Number 17    Number of Visits 23    Date for PT Re-Evaluation 01/29/23    Authorization - Visit Number 17    Authorization - Number of Visits 23    Progress Note Due on Visit 20    PT Start Time 1100    PT Stop Time 1139    PT Time Calculation (min) 39 min    Activity Tolerance Patient tolerated treatment well    Behavior During Therapy WFL for tasks assessed/performed              Past Medical History:  Diagnosis Date   A-fib (HCC)    Arthritis    hands,   Cancer (HCC)    prostate   Dysrhythmia 08/2019   A-fib from energy medication   History of deep vein thrombosis (DVT) of lower extremity    Past Surgical History:  Procedure Laterality Date   ATRIAL FIBRILLATION ABLATION  2023   Dr. 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   H/O total shoulder replacement, left 08/28/2022   Greater trochanteric pain syndrome of left lower extremity 10/20/2019    PCP: Karsten Fells  REFERRING PROVIDER: Ramond Marrow  REFERRING DIAG: s/p Lt reverse total shoulder  THERAPY DIAG:  Muscle weakness (generalized)  Localized edema  Chronic left shoulder pain  Rationale for Evaluation and Treatment: Rehabilitation  ONSET DATE:  08/28/22  SUBJECTIVE:                                                                                                                                                                                      SUBJECTIVE STATEMENT: Patient reports his biggest complaint is decreased strength. He states the doctor "turned me loose"  PERTINENT HISTORY: Shoulder surgery 08/14/22  PAIN:  Are you having pain? No  PRECAUTIONS: Other: Protocol in file. No resisted extension or IR for 3 months  WEIGHT BEARING RESTRICTIONS: No  FALLS:  Has patient fallen in last 6 months? No  OCCUPATION: Games developer  PLOF: Independent  PATIENT GOALS:improve function of Lt UE  NEXT MD VISIT: 11/21/22  OBJECTIVE:   POSTURE: Rounded shoulders, forward head  UPPER EXTREMITY ROM:   Active ROM Right eval Left eval Left 10/31/22 Left 12/10/22  Shoulder flexion  143 155 155  Shoulder extension      Shoulder abduction  120 140 150  Shoulder adduction      Shoulder internal rotation      Shoulder external rotation  35 45 45  Elbow flexion      Elbow extension      Wrist flexion      Wrist extension      Wrist ulnar deviation      Wrist radial deviation      Wrist pronation      Wrist supination      (Blank rows = not tested)  UPPER EXTREMITY MMT: Tested against gravity - no resistance on eval MMT Right eval Left eval Left 10/31/22 Left 12/12/22  Shoulder flexion  3 4+ 5  Shoulder extension      Shoulder abduction  3 4+ 4+  Shoulder adduction      Shoulder internal rotation      Shoulder external rotation  3 4 4   Middle trapezius      Lower trapezius      Elbow flexion      Elbow extension      Wrist flexion      Wrist extension      Wrist ulnar deviation      Wrist radial deviation      Wrist pronation      Wrist supination      Grip strength (lbs)      (Blank rows = not tested)   JOINT MOBILITY TESTING:  Shoulder jt mobility WFL  PALPATION:  No TTP shoulder  joint   TODAY'S TREATMENT:   [facility] Adult PT Treatment:                                                DATE: 12/18/22 Therapeutic Exercise: UBE L4 x 4 min alt fwd/bkwd IR 5# x 15 Isometric IR walk out red power band Isometric ER walk out red power band Sidelying ER 2# 3 x 10 Supine ER stretch Supine ER 2# 3 x 10 Iso ER red TB with shoulder flex x 20 Doorway ER stretch 3 x 30 sec   [facility] Adult PT Treatment:                                                DATE: 12/12/2022 Therapeutic Exercise: UBE L4 fwd/73min bkwd Shoulder ER isometric step out GTB x15 S/L shoulder ER 1#DB 3x10 Supine shoulder ER dynamic stretch 10x5" Standing shoulder ER (back against doorframe) 1#DB 2x10 Doorway shoulder ER stretch 5x10" Shoulder IR isometric presses 10x5" Shoulder extension isometric step out GTB x10 Shoulder IR isometric step out GTB x15    [facility] Adult PT Treatment:  DATE: 12/10/2022 Therapeutic Exercise: UBE L4 x 2 min fwd/2 min bkwd Shoulder ext & IR isometric 10x5" Shoulder IR stretch w/ strap  Shoulder ABC orange SB 5#DB bicep & hammer curls, focus on eccentric phase  L shoulder ER YTB 3x10 Scap push ups on wall  Incline plank + weight shifting for shoulder stabilization Shoulder IR stretch    PATIENT EDUCATION: Education details: Updated HEP Person educated: Patient Education method: Explanation, Demonstration, and Handouts Education comprehension: verbalized understanding and returned demonstration  HOME EXERCISE PROGRAM: Access Code: [email or web address] URL: [email or web address] Date: 12/12/2022 Prepared by: Carlynn Herald  Exercises - Standing Single Arm Shoulder Flexion with Posterior Anchored Resistance  - 1 x daily - 7 x weekly - 3 sets - 10 reps - Standing Single Arm Shoulder Abduction with Resistance  - 1 x daily - 7 x weekly - 3 sets - 10 reps - Shoulder External Rotation Reactive Isometrics  - 1 x daily - 7 x  weekly - 3 sets - 10 reps - Standing Shoulder Row with Anchored Resistance  - 1 x daily - 7 x weekly - 3 sets - 10 reps - Standing Single Arm Shoulder Flexion Stretch on Wall  - 1 x daily - 7 x weekly - 1 sets - 10 reps - 10 sec hold - Sidelying Shoulder ER with Towel and Dumbbell  - 1 x daily - 7 x weekly - 3 sets - 10 reps - Supine Shoulder External Rotation Stretch  - 1 x daily - 7 x weekly - 3 sets - 10 reps - Standing Shoulder External Rotation Stretch in Doorway  - 1 x daily - 7 x weekly - 3 sets - 10 reps - Standing Cable Shoulder External Rotation   - 1 x daily - 7 x weekly - 3 sets - 10 reps - Standing Isometric Shoulder Extension with Doorway - Arm Bent  - 1 x daily - 7 x weekly - 3 sets - 10 reps - Shoulder Extension Reactive Isometrics with Elbow Extended  - 1 x daily - 7 x weekly - 3 sets - 10 reps - Shoulder Internal Rotation Reactive Isometrics  - 1 x daily - 7 x weekly - 3 sets - 10 reps  ASSESSMENT:  CLINICAL IMPRESSION:  Pt is progressing well towards goals. His main deficit is ER strength and muscular endurance. Reviewed HEP with focus on ER strengthening. He will benefit from continued skilled PT to work towards remaining goals   OBJECTIVE IMPAIRMENTS: decreased activity tolerance, decreased ROM, decreased strength, increased edema, and impaired UE functional use.    GOALS: Goals reviewed with patient? Yes  SHORT TERM GOALS: Target date: 10/09/2022  Pt will be independent in initial HEP Baseline: Goal status: MET   LONG TERM GOALS: Target date: 01/29/2023    Pt will improve strength to 4+/5 to return to full work out routine Baseline:  Goal status: IN PROGRESS  2.  Pt will be independent in advanced HEP Baseline:  Goal status: IN PROGRESS  3.  Pt will improve Lt shoulder ROM to 160 degrees flexion and abduction, 60 degrees ER to improve mobility for IADLs Baseline:  Goal status: IN PROGRESS   PLAN:  PT FREQUENCY: 1-2x/week  PT DURATION: 6  weeks  PLANNED INTERVENTIONS: Therapeutic exercises, Therapeutic activity, Neuromuscular re-education, Balance training, Gait training, Patient/Family education, Self Care, Joint mobilization, Aquatic Therapy, Dry Needling, Electrical stimulation, Cryotherapy, Moist heat, Taping, Vasopneumatic device, Ultrasound, Ionotophoresis 4mg /ml Dexamethasone, Manual therapy, and Re-evaluation  PLAN FOR NEXT SESSION: Shoulder ER  strength & mobility; shoulder IR & ext isometrics PHYSICAL THERAPY DISCHARGE SUMMARY  Visits from Start of Care: 17  Current functional level related to goals / functional outcomes: Improved ROM and strength   Remaining deficits: ER strength and endurance   Education / Equipment: HEP   Patient agrees to discharge. Patient goals were partially met. Patient is being discharged due to not returning since the last visit. Reggy Eye, PT,DPT02/12/259:10 AM   12/18/2022 11:36 AM

## 2022-12-24 ENCOUNTER — Ambulatory Visit: Payer: Medicare Other | Admitting: Physical Therapy

## 2024-07-12 NOTE — Therapy (Signed)
 " OUTPATIENT PHYSICAL THERAPY LOWER EXTREMITY EVALUATION   Patient Name: Raymond Walsh MRN: [id number] DOB:05/01/1956, 69 y.o., male Today's Date: 07/13/2024  END OF SESSION:  PT End of Session - 07/13/24 1017     Visit Number 1    Number of Visits 13    Date for Recertification  08/28/24    Authorization Type [employer] MCR    Progress Note Due on Visit 10    PT Start Time 1017    PT Stop Time 1055    PT Time Calculation (min) 38 min    Activity Tolerance Patient tolerated treatment well    Behavior During Therapy WFL for tasks assessed/performed          Past Medical History:  Diagnosis Date   A-fib (HCC)    Arthritis    hands,   Cancer (HCC)    prostate   Dysrhythmia 08/2019   A-fib from energy medication   History of deep vein thrombosis (DVT) of lower extremity    Past Surgical History:  Procedure Laterality Date   ATRIAL FIBRILLATION ABLATION  2023   Dr. 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 The pain has slightly improved since onset, but remains aggravating. It is very uncomfortable if he is standing/walking for awhile and the pain will keep him awake at night. Patient had an MRI which showed a mensicus tear. He did undergo injection early on, but this did not hep with his pain. Recommended to start with PT and if no improvement that would consider surgical options. He has been completing some lower body workouts including light squatting and leg press. No popping/clicking. No feelings of instability.   PERTINENT HISTORY: Rt TKA  A-fib Prostate cancer  Back surgery  PAIN:  Are you having pain? Yes: NPRS scale: none currently; at worst 10 Pain location: Lt medial knee Pain description: sharp, ache Aggravating factors: prolonged standing/walking Relieving factors: rest  PRECAUTIONS: None  RED FLAGS: None   WEIGHT BEARING RESTRICTIONS: No  FALLS:  Has patient fallen in last 6 months? No  LIVING ENVIRONMENT: Lives with: lives with their spouse Lives in: House/apartment Stairs: No Has following equipment at home: None  OCCUPATION: semi-retired, conservation officer, nature   PLOF: Independent  PATIENT GOALS: I want to get fixed. I don't want an operation.   NEXT MD VISIT: nothing scheduled   OBJECTIVE:  Note: Objective measures were completed at Evaluation unless otherwise noted.  DIAGNOSTIC FINDINGS: none on file   PATIENT SURVEYS:  LEFS: 70/80  COGNITION: Overall cognitive status: Within functional limits for tasks assessed     SENSATION: Not tested  EDEMA:  No obvious swelling about the Lt knee   MUSCLE LENGTH: Hamstrings: Right lacking 25 deg; Left lacking 40 deg   PALPATION: TTP Lt medial joint line   LOWER EXTREMITY ROM:  Active ROM Right eval Left eval  Hip flexion    Hip extension    Hip abduction    Hip adduction    Hip  internal rotation    Hip external rotation    Knee flexion [facility] WFL  Knee extension [facility]  Ankle dorsiflexion    Ankle plantarflexion    Ankle inversion    Ankle eversion     (Blank rows = not tested)  LOWER EXTREMITY MMT:  MMT Right eval Left eval  Hip flexion 4 4  Hip extension 4 4  Hip abduction 4 4  Hip adduction 5 5  Hip internal rotation    Hip external rotation    Knee flexion 5 4  Knee extension 5 5  Ankle dorsiflexion    Ankle plantarflexion    Ankle inversion    Ankle eversion     (Blank rows = not tested)  LOWER EXTREMITY SPECIAL TESTS:  (+) [place] bilaterally   FUNCTIONAL TESTS:  SLS: LLE: 3 seconds; RLE 11 seconds  Squat: WNL  GAIT: Distance walked: 20 ft  Assistive device utilized: None Level of assistance: Complete Independence Comments: excessive foot ER during stance                                                                                                                                 [facility] Adult PT Treatment:                                                DATE: 07/13/24 Therapeutic Exercise: Demonstrated,performed, and issued initial HEP.    Self Care: Activities to avoid including deep squatting and pivoting/twisting on the knee Ice for pain  Appropriate gym activities      PATIENT EDUCATION:  Education details: see treatment; POC Person educated: Patient Education method: Explanation, Demonstration, Tactile cues, Verbal cues, and Handouts Education comprehension: verbalized understanding, returned demonstration, verbal cues required, tactile cues required, and needs further education  HOME EXERCISE PROGRAM: Access Code: [email or web address] URL: [email or web address] Date: 07/13/2024 Prepared by: Lucie Meeter  Exercises - Seated Hamstring Stretch  - 1 x daily - 7 x weekly - 3 sets - 30 sec  hold - Modified Thomas Stretch  - 1 x daily - 7 x weekly - 3 sets - 30 sec  hold - Supine Active Straight Leg Raise  - 1 x daily -  7 x weekly - 2 sets -  10 reps - Sidelying Hip Abduction  - 1 x daily - 7 x weekly - 2 sets - 10 reps - Prone Hip Extension  - 1 x daily - 7 x weekly - 2 sets - 10 reps  ASSESSMENT:  CLINICAL IMPRESSION: Patient is a 69 y.o. male who was seen today for physical therapy evaluation and treatment for a Complex tear of medial meniscus of the left knee that began of insidious onset about 3 months ago. Despite his pain he continues to complete all work and daily activities stating that prolonged walking and standing are aggravating factors. Upon assessment he is noted to have functional and pain free knee AROM, hamstring/quad tightness, hamstring weakness, bilateral hip weakness, balance deficits, and is tender to palpation at the medial joint line of the left knee. He will benefit from skilled PT to address the above stated deficits in order to optimize his function and assist in overall pain reduction.   OBJECTIVE IMPAIRMENTS: decreased balance, decreased endurance, decreased knowledge of condition, difficulty walking, decreased strength, impaired flexibility, and pain.   ACTIVITY LIMITATIONS: carrying, lifting, standing, and locomotion level  PARTICIPATION LIMITATIONS: patient does not limit his participation despite pain  PERSONAL FACTORS: Age, Fitness, Profession, Time since onset of injury/illness/exacerbation, and 3+ comorbidities: see PMH/PSH above are also affecting patient's functional outcome.   REHAB POTENTIAL: Good  CLINICAL DECISION MAKING: Stable/uncomplicated  EVALUATION COMPLEXITY: Low   GOALS: Goals reviewed with patient? Yes  SHORT TERM GOALS: Target date: 08/03/2024     Patient will be independent and compliant with initial HEP.   Baseline: initial HEP issued  Goal status: INITIAL  2.  Patient will improve Lt hamstring flexibility by at least 10 degrees to reduce stress on his knee with walking activity.  Baseline: see above Goal status: INITIAL  3.  Patient will  report pain at worst rated as </= 7/10 to signify improvement in his current condition.  Baseline: 10 Goal status: INITIAL   LONG TERM GOALS: Target date: 08/28/2024     Patient will be able to walk at least 1 mile with post-walk pain rated as </= 2/10.  Baseline: 10 Goal status: INITIAL  2.  Patient will demonstrate 5/5 Lt knee flexor strength to improve knee stability.  Baseline: see above Goal status: INITIAL  3.  Patient will demonstrate 5/5 bilateral hip strength to improve stability about the chain with prolonged walking/standing activity.  Baseline: see above Goal status: INITIAL  4.  Patient will maintain SLS on the LLE for at least 10 seconds to improve gait stability.  Baseline: see above Goal status: INITIAL  5.  Patient will be independent with advanced home program to progress/maintain current level of function.  Baseline: initial HEP issued  Goal status: INITIAL   PLAN:  PT FREQUENCY: 2x/week  PT DURATION: 6 weeks  PLANNED INTERVENTIONS: 97164- PT Re-evaluation, 97750- Physical Performance Testing, 97110-Therapeutic exercises, 97530- Therapeutic activity, [id number]- Neuromuscular re-education, 97535- Self Care, 02859- Manual therapy, [id number]- Gait training, [phone] (1-2 muscles), 20561 (3+ muscles)- Dry Needling, Patient/Family education, Cryotherapy, and Moist heat  PLAN FOR NEXT SESSION: hip and hamstring strengthening, quad/hamstring stretching, static balance activity; progress into CKC as appropriate    Jazline Cumbee, PT, DPT, ATC 07/13/2024 11:19 AM  "

## 2024-07-13 ENCOUNTER — Other Ambulatory Visit: Payer: Self-pay

## 2024-07-13 ENCOUNTER — Ambulatory Visit: Attending: Orthopedic Surgery

## 2024-07-13 DIAGNOSIS — S83232D Complex tear of medial meniscus, current injury, left knee, subsequent encounter: Secondary | ICD-10-CM | POA: Insufficient documentation

## 2024-07-13 DIAGNOSIS — M25562 Pain in left knee: Secondary | ICD-10-CM | POA: Insufficient documentation

## 2024-07-13 DIAGNOSIS — M6281 Muscle weakness (generalized): Secondary | ICD-10-CM | POA: Insufficient documentation

## 2024-07-15 ENCOUNTER — Ambulatory Visit

## 2024-07-15 DIAGNOSIS — M25562 Pain in left knee: Secondary | ICD-10-CM

## 2024-07-15 DIAGNOSIS — R600 Localized edema: Secondary | ICD-10-CM

## 2024-07-15 DIAGNOSIS — S83232D Complex tear of medial meniscus, current injury, left knee, subsequent encounter: Secondary | ICD-10-CM | POA: Diagnosis not present

## 2024-07-15 DIAGNOSIS — M6281 Muscle weakness (generalized): Secondary | ICD-10-CM

## 2024-07-15 NOTE — Therapy (Signed)
 " OUTPATIENT PHYSICAL THERAPY LOWER EXTREMITY TREATMENT   Patient Name: Raymond Walsh MRN: [id number] DOB:[birth year], 69 y.o., male Today's Date: 07/15/2024  END OF SESSION:  PT End of Session - 07/15/24 0848     Visit Number 2    Number of Visits 13    Date for Recertification  08/28/24    Authorization Type Aetna [facility]    Progress Note Due on Visit 10    PT Start Time 0847    PT Stop Time 0926    PT Time Calculation (min) 39 min    Activity Tolerance Patient tolerated treatment well    Behavior During Therapy [facility] for tasks assessed/performed           Past Medical History:  Diagnosis Date   A-fib [facility])    Arthritis    hands,   Cancer (HCC)    prostate   Dysrhythmia 08/2019   A-fib from energy medication   History of deep vein thrombosis (DVT) of lower extremity    Past Surgical History:  Procedure Laterality Date   ATRIAL FIBRILLATION ABLATION  2023   Dr. 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 Does still have difficulty with sleep due to pain. Has completed HEP once since evaluation.   EVAL: Patient reports noticing Lt knee pain about 2-3 months ago of insidious onset. The pain has slightly improved since onset, but remains aggravating. It is very uncomfortable if he is standing/walking for awhile and the pain will keep him awake at night. Patient had an MRI which showed a mensicus tear. He did undergo injection early on, but this did not hep with his pain. Recommended to start with PT and if no improvement that would consider surgical options. He has been completing some lower body workouts including light squatting and leg press. No popping/clicking. No feelings of instability.   PERTINENT HISTORY: Rt TKA  A-fib Prostate cancer  Back surgery  PAIN:  Are you having pain? Yes: NPRS scale: none currently; at worst 8 Pain location: Lt medial knee Pain description: sharp, ache Aggravating factors: prolonged standing/walking Relieving factors: rest  PRECAUTIONS: None  RED FLAGS: None   WEIGHT BEARING RESTRICTIONS: No  FALLS:  Has patient fallen in last 6 months? No  LIVING ENVIRONMENT: Lives with: lives with their spouse Lives in: House/apartment Stairs: No Has following equipment at home: None  OCCUPATION: semi-retired, conservation officer, nature   PLOF: Independent  PATIENT GOALS: I want to get fixed. I don't  want an operation.   NEXT MD VISIT: nothing scheduled   OBJECTIVE:  Note: Objective measures were completed at Evaluation unless otherwise noted.  DIAGNOSTIC FINDINGS: none on file   PATIENT SURVEYS:  LEFS: 70/80  COGNITION: Overall cognitive status: Within functional limits for tasks assessed     SENSATION: Not tested  EDEMA:  No obvious swelling about the Lt knee   MUSCLE LENGTH: Hamstrings: Right lacking 25 deg; Left lacking 40 deg   PALPATION: TTP Lt medial joint line    LOWER EXTREMITY ROM:  Active ROM Right eval Left eval  Hip flexion    Hip extension    Hip abduction    Hip adduction    Hip internal rotation    Hip external rotation    Knee flexion [facility] WFL  Knee extension [facility]  Ankle dorsiflexion    Ankle plantarflexion    Ankle inversion    Ankle eversion     (Blank rows = not tested)  LOWER EXTREMITY MMT:  MMT Right eval Left eval  Hip flexion 4 4  Hip extension 4 4  Hip abduction 4 4  Hip adduction 5 5  Hip internal rotation    Hip external rotation    Knee flexion 5 4  Knee extension 5 5  Ankle dorsiflexion    Ankle plantarflexion    Ankle inversion    Ankle eversion     (Blank rows = not tested)  LOWER EXTREMITY SPECIAL TESTS:  (+) [place] bilaterally   FUNCTIONAL TESTS:  SLS: LLE: 3 seconds; RLE 11 seconds  Squat: WNL  GAIT: Distance walked: 20 ft  Assistive device utilized: None Level of assistance: Complete Independence Comments: excessive foot ER during stance    [facility] Adult PT Treatment:                                                DATE: 07/15/24 Therapeutic Exercise: Hamstring stretch with strap x 1 minute  Prone quad stretch x 1 minute each  Seated HS curl blue band 2 x 10  HEP review/update   Neuromuscular re-ed: SLR 2 x 10  Sidelying hip abduction 2 x 10  Hip bridge with abduction blue band 2 x 10                                                                                                                        [facility] Adult PT Treatment:                                                DATE: 07/13/24 Therapeutic Exercise: Demonstrated,performed, and issued initial HEP.    Self Care: Activities to avoid including deep squatting and pivoting/twisting on the knee Ice for  pain  Appropriate gym activities      PATIENT EDUCATION:  Education details: see treatment; POC Person educated: Patient Education method: Explanation, Demonstration, Tactile cues, Verbal cues, and Handouts Education  comprehension: verbalized understanding, returned demonstration, verbal cues required, tactile cues required, and needs further education  HOME EXERCISE PROGRAM: Access Code: [email or web address] URL: [email or web address] Date: 07/15/2024 Prepared by: Lucie Meeter  Exercises - Seated Hamstring Stretch  - 1 x daily - 7 x weekly - 3 sets - 30 sec  hold - Modified Thomas Stretch  - 1 x daily - 7 x weekly - 3 sets - 30 sec  hold - Supine Hamstring Stretch with Strap  - 1 x daily - 7 x weekly - 3 sets - 30 sec  hold - Prone Quadriceps Stretch with Strap  - 1 x daily - 7 x weekly - 3 sets - 30 sec  hold - Supine Active Straight Leg Raise  - 1 x daily - 7 x weekly - 2 sets - 10 reps - Sidelying Hip Abduction  - 1 x daily - 7 x weekly - 2 sets - 10 reps - Prone Hip Extension  - 1 x daily - 7 x weekly - 2 sets - 10 reps  ASSESSMENT:  CLINICAL IMPRESSION: Focused on hip/knee strengthening today and quad/hamstring flexibility with good tolerance. Short-lived Lt hamstring cramping with bridge activity, otherwise no issues with today's strength progression. Overall demonstrates good control and stability with mat based strengthening. No reports of knee pain throughout session.   EVAL: Patient is a 69 y.o. male who was seen today for physical therapy evaluation and treatment for a Complex tear of medial meniscus of the left knee that began of insidious onset about 3 months ago. Despite his pain he continues to complete all work and daily activities stating that prolonged walking and standing are aggravating factors. Upon assessment he is noted to have functional and pain free knee AROM, hamstring/quad tightness, hamstring weakness, bilateral hip weakness, balance deficits, and is tender to palpation at the medial joint line of the left knee. He will benefit from skilled PT to address the above stated deficits in order to optimize his function and assist in overall pain reduction.   OBJECTIVE  IMPAIRMENTS: decreased balance, decreased endurance, decreased knowledge of condition, difficulty walking, decreased strength, impaired flexibility, and pain.   ACTIVITY LIMITATIONS: carrying, lifting, standing, and locomotion level  PARTICIPATION LIMITATIONS: patient does not limit his participation despite pain  PERSONAL FACTORS: Age, Fitness, Profession, Time since onset of injury/illness/exacerbation, and 3+ comorbidities: see PMH/PSH above are also affecting patient's functional outcome.   REHAB POTENTIAL: Good  CLINICAL DECISION MAKING: Stable/uncomplicated  EVALUATION COMPLEXITY: Low   GOALS: Goals reviewed with patient? Yes  SHORT TERM GOALS: Target date: 08/03/2024     Patient will be independent and compliant with initial HEP.   Baseline: initial HEP issued  Goal status: INITIAL  2.  Patient will improve Lt hamstring flexibility by at least 10 degrees to reduce stress on his knee with walking activity.  Baseline: see above Goal status: INITIAL  3.  Patient will report pain at worst rated as </= 7/10 to signify improvement in his current condition.  Baseline: 10 Goal status: INITIAL   LONG TERM GOALS: Target date: 08/28/2024     Patient will be able to walk at least 1 mile with post-walk pain rated as </= 2/10.  Baseline: 10 Goal status: INITIAL  2.  Patient will demonstrate 5/5 Lt knee flexor strength to improve  knee stability.  Baseline: see above Goal status: INITIAL  3.  Patient will demonstrate 5/5 bilateral hip strength to improve stability about the chain with prolonged walking/standing activity.  Baseline: see above Goal status: INITIAL  4.  Patient will maintain SLS on the LLE for at least 10 seconds to improve gait stability.  Baseline: see above Goal status: INITIAL  5.  Patient will be independent with advanced home program to progress/maintain current level of function.  Baseline: initial HEP issued  Goal status: INITIAL   PLAN:  PT  FREQUENCY: 2x/week  PT DURATION: 6 weeks  PLANNED INTERVENTIONS: 97164- PT Re-evaluation, 97750- Physical Performance Testing, 97110-Therapeutic exercises, 97530- Therapeutic activity, [id number]- Neuromuscular re-education, 97535- Self Care, 02859- Manual therapy, [id number]- Gait training, [phone] (1-2 muscles), 20561 (3+ muscles)- Dry Needling, Patient/Family education, Cryotherapy, and Moist heat  PLAN FOR NEXT SESSION: hip and hamstring strengthening, quad/hamstring stretching, static balance activity; progress into CKC as appropriate    Gearold Wainer, PT, DPT, ATC 07/15/24 9:26 AM  "

## 2024-07-20 ENCOUNTER — Ambulatory Visit

## 2024-07-20 DIAGNOSIS — M25562 Pain in left knee: Secondary | ICD-10-CM

## 2024-07-20 DIAGNOSIS — S83232D Complex tear of medial meniscus, current injury, left knee, subsequent encounter: Secondary | ICD-10-CM | POA: Diagnosis not present

## 2024-07-20 DIAGNOSIS — M6281 Muscle weakness (generalized): Secondary | ICD-10-CM

## 2024-07-20 NOTE — Therapy (Signed)
 " OUTPATIENT PHYSICAL THERAPY LOWER EXTREMITY TREATMENT   Patient Name: AUTRY PRUST MRN: [id number] DOB:[birth year], 69 y.o., male Today's Date: 07/20/2024  END OF SESSION:  PT End of Session - 07/20/24 1104     Visit Number 3    Number of Visits 13    Date for Recertification  08/28/24    Authorization Type [employer] MCR    Progress Note Due on Visit 10    PT Start Time 1103    PT Stop Time 1143    PT Time Calculation (min) 40 min    Activity Tolerance Patient tolerated treatment well    Behavior During Therapy WFL for tasks assessed/performed            Past Medical History:  Diagnosis Date   A-fib (HCC)    Arthritis    hands,   Cancer (HCC)    prostate   Dysrhythmia 08/2019   A-fib from energy medication   History of deep vein thrombosis (DVT) of lower extremity    Past Surgical History:  Procedure Laterality Date   ATRIAL FIBRILLATION ABLATION  2023   Dr. 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   EVAL: Patient reports noticing Lt knee pain about 2-3 months ago of insidious onset. The pain has slightly improved since onset, but remains aggravating. It is very uncomfortable if he is standing/walking for awhile and the pain will keep him awake at night. Patient had an MRI which showed a mensicus tear. He did undergo injection early on, but this did not hep with his pain. Recommended to start with PT and if no improvement that would consider surgical options. He has been completing some lower body workouts including light squatting and leg press. No popping/clicking. No feelings of instability.   PERTINENT HISTORY: Rt TKA  A-fib Prostate cancer  Back surgery  PAIN:  Are you having pain? Yes: NPRS scale: 8 Pain location: Lt medial knee Pain description: sharp, ache Aggravating factors: prolonged standing/walking Relieving factors: rest  PRECAUTIONS: None  RED FLAGS: None   WEIGHT BEARING RESTRICTIONS: No  FALLS:  Has patient fallen in last 6 months? No  LIVING ENVIRONMENT: Lives with: lives with their spouse Lives in: House/apartment Stairs: No Has following equipment at home: None  OCCUPATION: semi-retired, conservation officer, [employer]   PLOF: Independent  PATIENT GOALS: I want to get fixed. I don't want an operation.   NEXT MD VISIT: nothing scheduled   OBJECTIVE:  Note: Objective  measures were completed at Evaluation unless otherwise noted.  DIAGNOSTIC FINDINGS: none on file   PATIENT SURVEYS:  LEFS: 70/80  COGNITION: Overall cognitive status: Within functional limits for tasks assessed     SENSATION: Not tested  EDEMA:  No obvious swelling about the Lt knee   MUSCLE LENGTH: Hamstrings: Right lacking 25 deg; Left lacking 40 deg   PALPATION: TTP Lt medial joint line   LOWER EXTREMITY ROM:  Active ROM Right eval Left eval  Hip flexion    Hip extension    Hip abduction     Hip adduction    Hip internal rotation    Hip external rotation    Knee flexion [facility] WFL  Knee extension [facility]  Ankle dorsiflexion    Ankle plantarflexion    Ankle inversion    Ankle eversion     (Blank rows = not tested)  LOWER EXTREMITY MMT:  MMT Right eval Left eval  Hip flexion 4 4  Hip extension 4 4  Hip abduction 4 4  Hip adduction 5 5  Hip internal rotation    Hip external rotation    Knee flexion 5 4  Knee extension 5 5  Ankle dorsiflexion    Ankle plantarflexion    Ankle inversion    Ankle eversion     (Blank rows = not tested)  LOWER EXTREMITY SPECIAL TESTS:  (+) [place] bilaterally   FUNCTIONAL TESTS:  SLS: LLE: 3 seconds; RLE 11 seconds  Squat: WNL  GAIT: Distance walked: 20 ft  Assistive device utilized: None Level of assistance: Complete Independence Comments: excessive foot ER during stance    [facility] Adult PT Treatment:                                                DATE: 07/20/24  Manual Therapy: K-tape to Lt knee Y-strip distal quad to patellar tendon, I strip patellar tendon  Neuromuscular re-ed: LAQ 4 lbs 2 x 15  Hip bridge on physioball 2 x 10  Therapeutic Activity: Eccentric leg press @ 170 lbs 2 x 10  Step up knee driver 2 x 10; 6 inch step    [facility] Adult PT Treatment:                                                DATE: 07/15/24 Therapeutic Exercise: Hamstring stretch with strap x 1 minute  Prone quad stretch x 1 minute each  Seated HS curl blue band 2 x 10  HEP review/update   Neuromuscular re-ed: SLR 2 x 10  Sidelying hip abduction 2 x 10  Hip bridge with abduction blue band 2 x 10  [facility] Adult PT Treatment:                                                DATE: 07/13/24 Therapeutic Exercise: Demonstrated,performed, and issued initial HEP.    Self Care: Activities to avoid including deep squatting and  pivoting/twisting on the knee Ice for pain  Appropriate gym activities      PATIENT EDUCATION:  Education details: HEP update  Person educated: Patient Education method: Explanation, Demonstration, Tactile cues, Verbal cues, and Handouts Education comprehension: verbalized understanding, returned demonstration, verbal cues required, tactile cues required, and needs further education  HOME EXERCISE PROGRAM: Access Code: [email or web address] URL: [email or web address] Date: 07/20/2024 Prepared by: Lucie Meeter  Exercises - Modified Thomas Stretch  - 1 x daily - 7 x weekly - 3 sets - 30 sec  hold - Supine Hamstring Stretch with Strap  - 1 x daily - 7 x weekly - 3 sets - 30 sec  hold - Prone Quadriceps Stretch with Strap  - 1 x daily - 7 x weekly - 3 sets - 30 sec  hold - Supine Active Straight Leg Raise  - 1 x daily - 7 x weekly - 2 sets - 10 reps - Sidelying Hip Abduction  - 1 x daily - 7 x weekly - 2 sets - 10 reps - Prone Hip Extension  - 1 x daily - 7 x weekly - 2 sets - 10 reps - Bridge with Heels on [employer]  - 1 x daily - 7 x weekly - 2 sets - 10 reps - Runner's Step Up/Down  - 1 x daily - 7 x weekly - 2 sets - 10 reps  ASSESSMENT:  CLINICAL IMPRESSION: Patient reports no change in knee pain and has been consistent with HEP. Patient has more pain localized to medial aspect of patella upon arrival. K-tape was applied to Lt knee to improve stability and potentially assist in pain reduction. Continued with knee and hip strengthening without knee pain. Challenged in maintaining stability with step up activity on the LLE.   EVAL: Patient is a 69 y.o. male who was seen today for physical therapy evaluation and treatment for a Complex tear of medial meniscus of the left knee that began of insidious onset about 3 months ago. Despite his pain he continues to complete all work and daily activities stating that prolonged walking and standing are aggravating factors. Upon assessment he  is noted to have functional and pain free knee AROM, hamstring/quad tightness, hamstring weakness, bilateral hip weakness, balance deficits, and is tender to palpation at the medial joint line of the left knee. He will benefit from skilled PT to address the above stated deficits in order to optimize his function and assist in overall pain reduction.   OBJECTIVE IMPAIRMENTS: decreased balance, decreased endurance, decreased knowledge of condition, difficulty walking, decreased strength, impaired flexibility, and pain.   ACTIVITY LIMITATIONS: carrying, lifting, standing, and locomotion level  PARTICIPATION LIMITATIONS: patient does not limit his participation despite pain  PERSONAL FACTORS: Age, Fitness, Profession, Time since onset of injury/illness/exacerbation, and 3+ comorbidities: see PMH/PSH above are also affecting patient's functional outcome.   REHAB POTENTIAL: Good  CLINICAL DECISION MAKING: Stable/uncomplicated  EVALUATION COMPLEXITY: Low   GOALS: Goals reviewed with patient? Yes  SHORT TERM GOALS: Target date: 08/03/2024     Patient will be independent and compliant with  initial HEP.   Baseline: initial HEP issued  Goal status: INITIAL  2.  Patient will improve Lt hamstring flexibility by at least 10 degrees to reduce stress on his knee with walking activity.  Baseline: see above Goal status: INITIAL  3.  Patient will report pain at worst rated as </= 7/10 to signify improvement in his current condition.  Baseline: 10 Goal status: INITIAL   LONG TERM GOALS: Target date: 08/28/2024     Patient will be able to walk at least 1 mile with post-walk pain rated as </= 2/10.  Baseline: 10 Goal status: INITIAL  2.  Patient will demonstrate 5/5 Lt knee flexor strength to improve knee stability.  Baseline: see above Goal status: INITIAL  3.  Patient will demonstrate 5/5 bilateral hip strength to improve stability about the chain with prolonged walking/standing  activity.  Baseline: see above Goal status: INITIAL  4.  Patient will maintain SLS on the LLE for at least 10 seconds to improve gait stability.  Baseline: see above Goal status: INITIAL  5.  Patient will be independent with advanced home program to progress/maintain current level of function.  Baseline: initial HEP issued  Goal status: INITIAL   PLAN:  PT FREQUENCY: 2x/week  PT DURATION: 6 weeks  PLANNED INTERVENTIONS: 97164- PT Re-evaluation, 97750- Physical Performance Testing, 97110-Therapeutic exercises, 97530- Therapeutic activity, [id number]- Neuromuscular re-education, 97535- Self Care, 02859- Manual therapy, [id number]- Gait training, [phone] (1-2 muscles), [id number] (3+ muscles)- Dry Needling, Patient/Family education, Cryotherapy, and Moist heat  PLAN FOR NEXT SESSION: hip and hamstring strengthening, quad/hamstring stretching, static balance activity; progress into CKC as appropriate    Starlin Steib, PT, DPT, ATC 07/20/24 11:51 AM  "

## 2024-07-22 ENCOUNTER — Ambulatory Visit

## 2024-07-22 DIAGNOSIS — S83232D Complex tear of medial meniscus, current injury, left knee, subsequent encounter: Secondary | ICD-10-CM | POA: Diagnosis not present

## 2024-07-22 DIAGNOSIS — M25562 Pain in left knee: Secondary | ICD-10-CM

## 2024-07-22 DIAGNOSIS — R600 Localized edema: Secondary | ICD-10-CM

## 2024-07-22 DIAGNOSIS — M6281 Muscle weakness (generalized): Secondary | ICD-10-CM

## 2024-07-22 NOTE — Therapy (Signed)
 " OUTPATIENT PHYSICAL THERAPY LOWER EXTREMITY TREATMENT  PHYSICAL THERAPY DISCHARGE SUMMARY  Visits from Start of Care: 4  Current functional level related to goals / functional outcomes: See goals below   Remaining deficits: Worsening Lt knee pain    Education / Equipment: See education below    Patient agrees to discharge. Patient goals were partially met. Patient is being discharged due to did not respond to therapy.   Patient Name: Raymond Walsh MRN: [id number] DOB:Apr 03, 1956, 69 y.o., male Today's Date: 07/22/2024  END OF SESSION:  PT End of Session - 07/22/24 1020     Visit Number 4    Number of Visits 13    Date for Recertification  08/28/24    Authorization Type [employer] MCR    Progress Note Due on Visit 10    PT Start Time 1018    PT Stop Time 1059    PT Time Calculation (min) 41 min    Activity Tolerance Patient tolerated treatment well    Behavior During Therapy WFL for tasks assessed/performed             Past Medical History:  Diagnosis Date   A-fib (HCC)    Arthritis    hands,   Cancer (HCC)    prostate   Dysrhythmia 08/2019   A-fib from energy medication   History of deep vein thrombosis (DVT) of lower extremity    Past Surgical History:  Procedure Laterality Date   ATRIAL FIBRILLATION ABLATION  2023   Dr. 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 It is interfering with his ability to walk, stand, and sleep. He is getting frustrated with his pain because he needs to be able to do these activities. No change in pain with tape after last visit.   EVAL: Patient reports noticing Lt knee pain about 2-3 months ago of insidious onset. The pain has slightly improved since onset, but remains aggravating. It is very uncomfortable if he is standing/walking for awhile and the pain will keep him awake at night. Patient had an MRI which showed a mensicus tear. He did undergo injection early on, but this did not hep with his pain. Recommended to start with PT and if no improvement that would consider surgical options. He has been completing some lower body workouts including light squatting and leg press. No popping/clicking. No feelings of instability.   PERTINENT HISTORY: Rt TKA  A-fib Prostate cancer  Back surgery  PAIN:  Are you having pain? Yes: NPRS scale: 8 Pain location: Lt medial knee Pain description: sharp, ache Aggravating factors: prolonged standing/walking Relieving factors: rest  PRECAUTIONS: None  RED FLAGS: None   WEIGHT BEARING RESTRICTIONS: No  FALLS:  Has patient fallen in last 6 months? No  LIVING ENVIRONMENT: Lives with: lives with their spouse Lives in: House/apartment Stairs: No Has following equipment at home: None  OCCUPATION: semi-retired, conservation officer, [employer]   PLOF: Independent  PATIENT GOALS: I want to get fixed. I don't want an  operation.   NEXT MD VISIT: nothing scheduled   OBJECTIVE:  Note: Objective measures were completed at Evaluation unless otherwise noted.  DIAGNOSTIC FINDINGS: none on file   PATIENT SURVEYS:  LEFS: 70/80  COGNITION: Overall cognitive status: Within functional limits for tasks assessed     SENSATION: Not tested  EDEMA:  No obvious swelling about the Lt knee   MUSCLE LENGTH: Hamstrings: Right lacking 25 deg; Left lacking 40 deg  07/22/24: left lacking 26, right lacking 20    PALPATION: TTP Lt medial joint line   LOWER EXTREMITY ROM:  Active ROM Right eval Left eval  Hip flexion    Hip extension    Hip abduction    Hip adduction    Hip internal rotation    Hip external rotation    Knee flexion [facility] WFL  Knee extension [facility]  Ankle dorsiflexion    Ankle plantarflexion    Ankle inversion    Ankle eversion     (Blank rows = not tested)  LOWER EXTREMITY MMT:  MMT Right eval Left eval 07/22/24  Hip flexion 4 4 5  bilateral  Hip extension 4 4 4  bilateral  Hip abduction 4 4 4  bilateral  Hip adduction 5 5   Hip internal rotation     Hip external rotation     Knee flexion 5 4 Left 5  Knee extension 5 5   Ankle dorsiflexion     Ankle plantarflexion     Ankle inversion     Ankle eversion      (Blank rows = not tested)  LOWER EXTREMITY SPECIAL TESTS:  (+) [place] bilaterally   FUNCTIONAL TESTS:  SLS: LLE: 3 seconds; RLE 11 seconds  Squat: WNL  GAIT: Distance walked: 20 ft  Assistive device utilized: None Level of assistance: Complete Independence Comments: excessive foot ER during stance   [facility] Adult PT Treatment:                                                DATE: 07/22/24 Therapeutic Exercise: Reviewed and updated HEP performing as exercises as needed  Therapeutic Activity: Re-assessment of goals, educating patient on overall functional progress  Self Care: Ice for pain control Potential benefit of knee compression sleeve/brace for pain Sleep  positioning recommendations Anatomy of current condition  Recommend f/u with referring provider for worsening pain   [facility] Adult PT Treatment:                                                DATE: 07/20/24  Manual Therapy: K-tape to Lt knee Y-strip distal quad to patellar tendon, I strip patellar tendon  Neuromuscular re-ed: LAQ 4 lbs 2 x 15  Hip  bridge on physioball 2 x 10  Therapeutic Activity: Eccentric leg press @ 170 lbs 2 x 10  Step up knee driver 2 x 10; 6 inch step    [facility] Adult PT Treatment:                                                DATE: 07/15/24 Therapeutic Exercise: Hamstring stretch with strap x 1 minute  Prone quad stretch x 1 minute each  Seated HS curl blue band 2 x 10  HEP review/update   Neuromuscular re-ed: SLR 2 x 10  Sidelying hip abduction 2 x 10  Hip bridge with abduction blue band 2 x 10                                                                                                                        [facility] Adult PT Treatment:                                                DATE: 07/13/24 Therapeutic Exercise: Demonstrated,performed, and issued initial HEP.    Self Care: Activities to avoid including deep squatting and pivoting/twisting on the knee Ice for pain  Appropriate gym activities      PATIENT EDUCATION:  Education details: see treatment; d/c education  Person educated: Patient Education method: Explanation, Demonstration, and Handouts Education comprehension: verbalized understanding and returned demonstration  HOME EXERCISE PROGRAM: Access Code: [email or web address] URL: [email or web address] Date: 07/22/2024 Prepared by: Lucie Meeter  Exercises - Modified Thomas Stretch  - 1 x daily - 7 x weekly - 3 sets - 30 sec  hold - Supine Hamstring Stretch with Strap  - 1 x daily - 7 x weekly - 3 sets - 30 sec  hold - Prone Quadriceps Stretch with Strap  - 1 x daily - 7 x weekly - 3 sets - 30 sec  hold - Supine Active Straight Leg  Raise  - 1 x daily - 7 x weekly - 2 sets - 10 reps - Sidelying Hip Abduction  - 1 x daily - 7 x weekly - 2 sets - 10 reps - Prone Hip Extension  - 1 x daily - 7 x weekly - 2 sets - 10 reps - Bridge with Heels on [employer]  - 1 x daily - 7 x weekly - 2 sets - 10 reps - Runner's Step Up/Down  - 1 x daily - 7 x weekly - 2 sets - 10 reps - Clam with Resistance  - 1 x daily - 7 x weekly - 2 sets - 10 reps  ASSESSMENT:  CLINICAL IMPRESSION: Rayshawn has attended 4 PT sessions reporting worsening, significant Lt knee pain that is interfering with his ability to  walk, stand, and sleep. Since the start of care he demonstrates objective improvements in lower body strength and flexibility. Despite these objective improvements and targeted therapeutic interventions for strengthening, mobility, and pain reduction his pain continues to worsen. He is therefore appropriate for discharge at this time with recommendation to f/u with referring provider for further assessment and treatment of his chronic, worsening Lt knee pain with patient in agreement with this plan.   EVAL: Patient is a 69 y.o. male who was seen today for physical therapy evaluation and treatment for a Complex tear of medial meniscus of the left knee that began of insidious onset about 3 months ago. Despite his pain he continues to complete all work and daily activities stating that prolonged walking and standing are aggravating factors. Upon assessment he is noted to have functional and pain free knee AROM, hamstring/quad tightness, hamstring weakness, bilateral hip weakness, balance deficits, and is tender to palpation at the medial joint line of the left knee. He will benefit from skilled PT to address the above stated deficits in order to optimize his function and assist in overall pain reduction.   OBJECTIVE IMPAIRMENTS: decreased balance, decreased endurance, decreased knowledge of condition, difficulty walking, decreased strength, impaired  flexibility, and pain.   ACTIVITY LIMITATIONS: carrying, lifting, standing, and locomotion level  PARTICIPATION LIMITATIONS: patient does not limit his participation despite pain  PERSONAL FACTORS: Age, Fitness, Profession, Time since onset of injury/illness/exacerbation, and 3+ comorbidities: see PMH/PSH above are also affecting patient's functional outcome.   REHAB POTENTIAL: Good  CLINICAL DECISION MAKING: Stable/uncomplicated  EVALUATION COMPLEXITY: Low   GOALS: Goals reviewed with patient? Yes  SHORT TERM GOALS: Target date: 08/03/2024     Patient will be independent and compliant with initial HEP.   Baseline: initial HEP issued  Goal status: MET  2.  Patient will improve Lt hamstring flexibility by at least 10 degrees to reduce stress on his knee with walking activity.  Baseline: see above Goal status: MET  3.  Patient will report pain at worst rated as </= 7/10 to signify improvement in his current condition.  Baseline: 10 07/22/24: 10 at worst  Goal status: not met   LONG TERM GOALS: Target date: 08/28/2024     Patient will be able to walk at least 1 mile with post-walk pain rated as </= 2/10.  Baseline: 10 07/22/24: high levels of pain after walking activity  Goal status: NOT MET  2.  Patient will demonstrate 5/5 Lt knee flexor strength to improve knee stability.  Baseline: see above Goal status: MET  3.  Patient will demonstrate 5/5 bilateral hip strength to improve stability about the chain with prolonged walking/standing activity.  Baseline: see above Goal status: partially met   4.  Patient will maintain SLS on the LLE for at least 10 seconds to improve gait stability.  Baseline: see above 07/22/24: 7 seconds pain  Goal status: NOT MET  5.  Patient will be independent with advanced home program to progress/maintain current level of function.  Baseline: initial HEP issued  Goal status: MET   PLAN:  PT FREQUENCY: 2x/week  PT DURATION: 6  weeks  PLANNED INTERVENTIONS: 97164- PT Re-evaluation, 97750- Physical Performance Testing, 97110-Therapeutic exercises, 97530- Therapeutic activity, [id number]- Neuromuscular re-education, 97535- Self Care, 02859- Manual therapy, [id number]- Gait training, [phone] (1-2 muscles), 20561 (3+ muscles)- Dry Needling, Patient/Family education, Cryotherapy, and Moist heat  PLAN FOR NEXT SESSION: n/a d/c    Maham Quintin, PT, DPT, ATC 07/22/24 12:53 PM  "

## 2024-07-27 ENCOUNTER — Ambulatory Visit

## 2024-07-29 ENCOUNTER — Ambulatory Visit

## 2024-08-03 ENCOUNTER — Ambulatory Visit

## 2024-08-05 ENCOUNTER — Ambulatory Visit

## 2024-08-10 ENCOUNTER — Ambulatory Visit: Admitting: Rehabilitative and Restorative Service Providers"

## 2024-08-12 ENCOUNTER — Ambulatory Visit: Admitting: Rehabilitative and Restorative Service Providers"

## 2024-08-17 ENCOUNTER — Ambulatory Visit

## 2024-08-19 ENCOUNTER — Ambulatory Visit
